# Patient Record
Sex: Male | Born: 1970 | Race: White | Hispanic: No
Health system: Midwestern US, Community
[De-identification: ages and names within clinical notes are randomized; demographics above are authoritative.]

## PROBLEM LIST (undated history)

## (undated) DIAGNOSIS — R1031 Right lower quadrant pain: Principal | ICD-10-CM

## (undated) DIAGNOSIS — F1910 Other psychoactive substance abuse, uncomplicated: Secondary | ICD-10-CM

## (undated) DIAGNOSIS — H8109 Meniere's disease, unspecified ear: Secondary | ICD-10-CM

## (undated) HISTORY — PX: FRACTURE SURGERY: SHX138

---

## 2011-06-04 ENCOUNTER — Emergency Department (HOSPITAL_COMMUNITY)
Admission: EM | Admit: 2011-06-04 | Discharge: 2011-06-04 | Disposition: A | Discharge status: Home / Self Care | Payer: Self-pay | Attending: Emergency Medicine | Admitting: Emergency Medicine

## 2011-06-04 ENCOUNTER — Emergency Department (HOSPITAL_COMMUNITY): Payer: Self-pay

## 2011-06-04 DIAGNOSIS — R1110 Vomiting, unspecified: Secondary | ICD-10-CM | POA: Insufficient documentation

## 2011-06-04 DIAGNOSIS — R109 Unspecified abdominal pain: Secondary | ICD-10-CM | POA: Insufficient documentation

## 2011-06-04 DIAGNOSIS — R509 Fever, unspecified: Secondary | ICD-10-CM | POA: Insufficient documentation

## 2011-06-04 LAB — COMPREHENSIVE METABOLIC PANEL
AST: 13 U/L (ref 0–37)
Albumin: 3.8 g/dL (ref 3.5–5.2)
Alkaline Phosphatase: 57 U/L (ref 39–117)
BUN: 18 mg/dL (ref 6–23)
Potassium: 3.9 mEq/L (ref 3.5–5.1)
Sodium: 138 mEq/L (ref 135–145)
Total Protein: 7.3 g/dL (ref 6.0–8.3)

## 2011-06-04 LAB — CBC
HCT: 40 % (ref 39.0–52.0)
MCHC: 35.3 g/dL (ref 30.0–36.0)
Platelets: 265 10*3/uL (ref 150–400)
RDW: 12.8 % (ref 11.5–15.5)
WBC: 13 10*3/uL — ABNORMAL HIGH (ref 4.0–10.5)

## 2011-06-04 LAB — DIFFERENTIAL
Basophils Absolute: 0 10*3/uL (ref 0.0–0.1)
Basophils Relative: 0 % (ref 0–1)
Eosinophils Absolute: 0.4 10*3/uL (ref 0.0–0.7)
Eosinophils Relative: 3 % (ref 0–5)
Monocytes Absolute: 0.8 10*3/uL (ref 0.1–1.0)

## 2011-06-04 LAB — LIPASE, BLOOD: Lipase: 45 U/L (ref 11–59)

## 2011-06-04 LAB — URINALYSIS, ROUTINE W REFLEX MICROSCOPIC
Bilirubin Urine: NEGATIVE
Glucose, UA: NEGATIVE mg/dL
Leukocytes, UA: NEGATIVE
Nitrite: NEGATIVE
Specific Gravity, Urine: 1.023 (ref 1.005–1.030)
pH: 5 (ref 5.0–8.0)

## 2011-06-04 MED ORDER — IOHEXOL 300 MG/ML  SOLN
100.0000 mL | Freq: Once | INTRAMUSCULAR | Status: AC | PRN
  Administered 2011-06-04: 100 mL via INTRAVENOUS

## 2011-08-01 ENCOUNTER — Emergency Department (HOSPITAL_COMMUNITY): Payer: Self-pay

## 2011-08-01 ENCOUNTER — Emergency Department (HOSPITAL_COMMUNITY)
Admission: EM | Admit: 2011-08-01 | Discharge: 2011-08-01 | Disposition: A | Discharge status: Home / Self Care | Payer: Self-pay | Attending: Emergency Medicine | Admitting: Emergency Medicine

## 2011-08-01 ENCOUNTER — Encounter: Payer: Self-pay | Admitting: Emergency Medicine

## 2011-08-01 DIAGNOSIS — R110 Nausea: Secondary | ICD-10-CM | POA: Insufficient documentation

## 2011-08-01 DIAGNOSIS — R10819 Abdominal tenderness, unspecified site: Secondary | ICD-10-CM | POA: Insufficient documentation

## 2011-08-01 DIAGNOSIS — R109 Unspecified abdominal pain: Secondary | ICD-10-CM | POA: Insufficient documentation

## 2011-08-01 LAB — CBC
HCT: 36.9 % — ABNORMAL LOW (ref 39.0–52.0)
Hemoglobin: 12.8 g/dL — ABNORMAL LOW (ref 13.0–17.0)
MCH: 30.6 pg (ref 26.0–34.0)
MCHC: 34.7 g/dL (ref 30.0–36.0)
MCV: 88.3 fL (ref 78.0–100.0)
Platelets: 225 K/uL (ref 150–400)
RBC: 4.18 MIL/uL — ABNORMAL LOW (ref 4.22–5.81)
RDW: 12.7 % (ref 11.5–15.5)
WBC: 8.3 K/uL (ref 4.0–10.5)

## 2011-08-01 LAB — COMPREHENSIVE METABOLIC PANEL
ALT: 11 U/L (ref 0–53)
Alkaline Phosphatase: 74 U/L (ref 39–117)
CO2: 26 mEq/L (ref 19–32)
GFR calc Af Amer: >90 mL/min (ref >90)
GFR calc non Af Amer: >90 mL/min (ref >90)
Glucose, Bld: 114 mg/dL — ABNORMAL HIGH (ref 70–99)
Potassium: 3.8 mEq/L (ref 3.5–5.1)
Sodium: 141 mEq/L (ref 135–145)

## 2011-08-01 LAB — LIPASE, BLOOD: Lipase: 34 U/L (ref 11–59)

## 2011-08-01 MED ORDER — HYDROMORPHONE HCL PF 1 MG/ML IJ SOLN: 1.0000 mg | Freq: Once | INTRAMUSCULAR | Status: DC

## 2011-08-01 MED ORDER — HYDROMORPHONE HCL PF 2 MG/ML IJ SOLN
INTRAMUSCULAR | Status: AC
  Filled 2011-08-01: qty 1

## 2011-08-01 MED ORDER — SODIUM CHLORIDE 0.9 % IV BOLUS (SEPSIS)
1000.0000 mL | Freq: Once | INTRAVENOUS | Status: AC
  Administered 2011-08-01: 1000 mL via INTRAVENOUS

## 2011-08-01 MED ORDER — OXYCODONE-ACETAMINOPHEN 5-325 MG PO TABS: 2.0000 | ORAL_TABLET | ORAL | Status: DC | PRN

## 2011-08-01 MED ORDER — FENTANYL CITRATE 0.05 MG/ML IJ SOLN
100.0000 ug | Freq: Once | INTRAMUSCULAR | Status: AC
  Administered 2011-08-01: 100 ug via INTRAVENOUS
  Filled 2011-08-01: qty 2

## 2011-08-01 MED ORDER — HYDROMORPHONE HCL PF 2 MG/ML IJ SOLN
INTRAMUSCULAR | Status: AC
  Administered 2011-08-01: 1 mg
  Filled 2011-08-01: qty 1

## 2011-08-01 MED ORDER — IOHEXOL 300 MG/ML  SOLN
100.0000 mL | Freq: Once | INTRAMUSCULAR | Status: AC | PRN
  Administered 2011-08-01: 100 mL via INTRAVENOUS

## 2011-08-01 MED ORDER — HYDROMORPHONE HCL PF 1 MG/ML IJ SOLN
1.0000 mg | Freq: Once | INTRAMUSCULAR | Status: AC
  Administered 2011-08-01: 1 mg via INTRAVENOUS

## 2011-08-01 MED ORDER — METRONIDAZOLE 500 MG PO TABS: 500.0000 mg | ORAL_TABLET | Freq: Two times a day (BID) | ORAL | Status: DC

## 2011-08-01 MED ORDER — CIPROFLOXACIN HCL 500 MG PO TABS: 500.0000 mg | ORAL_TABLET | Freq: Two times a day (BID) | ORAL | Status: DC

## 2011-08-01 MED ORDER — ONDANSETRON HCL 4 MG/2ML IJ SOLN
4.0000 mg | Freq: Once | INTRAMUSCULAR | Status: AC
  Administered 2011-08-01: 4 mg via INTRAVENOUS
  Filled 2011-08-01: qty 2

## 2011-08-01 MED ORDER — HYDROMORPHONE HCL PF 2 MG/ML IJ SOLN
INTRAMUSCULAR | Status: AC
  Administered 2011-08-01: 1 mg via INTRAVENOUS
  Filled 2011-08-01: qty 1
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
## 2011-08-01 NOTE — ED Notes (Signed)
No complaints at present. Voices understanding of instructions given. Walked to check out window.  

## 2011-08-01 NOTE — ED Provider Notes (Signed)
History     CSN: [id number] Arrival date & time: 08/01/2011  1:03 PM   First MD Initiated Contact with Patient 08/01/11 1315      Chief Complaint  Patient presents with  . Abdominal Pain    HPI The patient reports lower abdominal pain for 2-3 days with dark maroon stools.  He reports nausea without vomiting.  He denies diarrhea.  Denies fever chills.  He reports he is in college.  He denies alcohol abuse.  He reports no prior similar symptoms.  Nothing worsens the symptoms.  Nothing improves his symptoms.  His pain is moderate to severe at this time.  He reports the pain is constant.  He has no radiations of his abdominal  History reviewed. No pertinent past medical history.  History reviewed. No pertinent past surgical history.  History reviewed. No pertinent family history.  History  Substance Use Topics  . Smoking status: Not on file  . Smokeless tobacco: Not on file  . Alcohol Use: No      Review of Systems  Genitourinary: Negative for dysuria, frequency, flank pain, penile swelling, scrotal swelling and testicular pain.  Musculoskeletal: Negative for back pain.  All other systems reviewed and are negative.    Allergies  Penicillins  Home Medications  No current outpatient prescriptions on file.  BP 100/56  Pulse 79  Temp(Src) 98 F (36.7 C) (Oral)  Resp 16  SpO2 98%  Physical Exam  Nursing note and vitals reviewed. Constitutional: He is oriented to person, place, and time. He appears well-developed and well-nourished.  HENT:  Head: Normocephalic and atraumatic.  Eyes: EOM are normal.  Neck: Normal range of motion.  Cardiovascular: Normal rate, regular rhythm, normal heart sounds and intact distal pulses.   Pulmonary/Chest: Effort normal and breath sounds normal. No respiratory distress.  Abdominal: Soft. He exhibits no distension. There is tenderness.       Tenderness in the epigastric and right upper quadrant without guarding or rebound    Genitourinary:       Normal tone.  No external hemorrhoids.  Normal colored stool obtained.  No gross blood or clots.  Musculoskeletal: Normal range of motion.  Neurological: He is alert and oriented to person, place, and time.  Skin: Skin is warm and dry.  Psychiatric: He has a normal mood and affect. Judgment normal.    ED Course  Procedures (including critical care time)  Labs Reviewed  CBC - Abnormal; Notable for the following:    RBC 4.18 (*)    Hemoglobin 12.8 (*)    HCT 36.9 (*)    All other components within normal limits  COMPREHENSIVE METABOLIC PANEL - Abnormal; Notable for the following:    Glucose, Bld 114 (*)    Albumin 3.4 (*)    Total Bilirubin 0.1 (*)    All other components within normal limits  LIPASE, BLOOD  OCCULT BLOOD, POC DEVICE  OCCULT BLOOD X 1 CARD TO LAB, STOOL   No results found.   No diagnosis found.    MDM  Will treat pain and obtain labs at this time.  Hemoccult sent  4:47 PM Patient is feeling better at this time.  Given his abdominal pain a CT scan will be obtained.  His stool is normal color on rectal exam.  My suspicion for significant GI bleed is very low.  His stools are not with melena there are normal in color. Care to Dr Freida Busman at 1645 to follow up on CT scan  Lyanne Co, MD 08/01/11 [phone]

## 2011-08-01 NOTE — ED Notes (Signed)
[id number]<AV> Expected date:08/01/11<BR> Expected time:12:56 PM<BR> Means of arrival:Ambulance<BR> Comments:<BR> abd pain

## 2011-08-01 NOTE — ED Provider Notes (Signed)
pts abd ct with possible diverticulitis, patient given pain medication here. We'll treat for possible diverticulitis he'll followup with his Dr. as needed  Toy Baker, MD 08/01/11 [phone]

## 2011-08-05 ENCOUNTER — Emergency Department (HOSPITAL_COMMUNITY): Payer: Self-pay

## 2011-08-05 ENCOUNTER — Encounter (HOSPITAL_COMMUNITY): Payer: Self-pay | Admitting: Emergency Medicine

## 2011-08-05 ENCOUNTER — Emergency Department (HOSPITAL_COMMUNITY)
Admission: EM | Admit: 2011-08-05 | Discharge: 2011-08-05 | Disposition: A | Discharge status: Home / Self Care | Payer: No Typology Code available for payment source | Attending: Emergency Medicine | Admitting: Emergency Medicine

## 2011-08-05 DIAGNOSIS — G8929 Other chronic pain: Secondary | ICD-10-CM | POA: Insufficient documentation

## 2011-08-05 DIAGNOSIS — R109 Unspecified abdominal pain: Secondary | ICD-10-CM | POA: Insufficient documentation

## 2011-08-05 DIAGNOSIS — S060X9A Concussion with loss of consciousness of unspecified duration, initial encounter: Secondary | ICD-10-CM | POA: Insufficient documentation

## 2011-08-05 DIAGNOSIS — S060XAA Concussion with loss of consciousness status unknown, initial encounter: Secondary | ICD-10-CM | POA: Insufficient documentation

## 2011-08-05 DIAGNOSIS — M542 Cervicalgia: Secondary | ICD-10-CM | POA: Insufficient documentation

## 2011-08-05 DIAGNOSIS — M549 Dorsalgia, unspecified: Secondary | ICD-10-CM | POA: Insufficient documentation

## 2011-08-05 DIAGNOSIS — S161XXA Strain of muscle, fascia and tendon at neck level, initial encounter: Secondary | ICD-10-CM

## 2011-08-05 DIAGNOSIS — R51 Headache: Secondary | ICD-10-CM | POA: Insufficient documentation

## 2011-08-05 DIAGNOSIS — S139XXA Sprain of joints and ligaments of unspecified parts of neck, initial encounter: Secondary | ICD-10-CM | POA: Insufficient documentation

## 2011-08-05 LAB — CBC
Hemoglobin: 13.6 g/dL (ref 13.0–17.0)
MCH: 31.1 pg (ref 26.0–34.0)
MCHC: 35.2 g/dL (ref 30.0–36.0)
Platelets: 240 10*3/uL (ref 150–400)

## 2011-08-05 LAB — LIPASE, BLOOD: Lipase: 25 U/L (ref 11–59)

## 2011-08-05 LAB — COMPREHENSIVE METABOLIC PANEL
ALT: 15 U/L (ref 0–53)
AST: 16 U/L (ref 0–37)
Albumin: 3.7 g/dL (ref 3.5–5.2)
Alkaline Phosphatase: 62 U/L (ref 39–117)
Chloride: 104 mEq/L (ref 96–112)
Potassium: 3.4 mEq/L — ABNORMAL LOW (ref 3.5–5.1)
Sodium: 139 mEq/L (ref 135–145)
Total Bilirubin: 0.4 mg/dL (ref 0.3–1.2)
Total Protein: 7 g/dL (ref 6.0–8.3)

## 2011-08-05 LAB — DIFFERENTIAL
Basophils Absolute: 0 10*3/uL (ref 0.0–0.1)
Basophils Relative: 0 % (ref 0–1)
Eosinophils Absolute: 0.3 10*3/uL (ref 0.0–0.7)
Monocytes Relative: 8 % (ref 3–12)
Neutro Abs: 7.9 10*3/uL — ABNORMAL HIGH (ref 1.7–7.7)
Neutrophils Relative %: 70 % (ref 43–77)

## 2011-08-05 MED ORDER — HYDROMORPHONE HCL PF 1 MG/ML IJ SOLN
0.5000 mg | Freq: Once | INTRAMUSCULAR | Status: AC
  Administered 2011-08-05: 0.5 mg via INTRAMUSCULAR
  Filled 2011-08-05: qty 1

## 2011-08-05 MED ORDER — HYDROCODONE-ACETAMINOPHEN 5-325 MG PO TABS: 2.0000 | ORAL_TABLET | ORAL | Status: DC | PRN

## 2011-08-05 MED ORDER — HYDROMORPHONE HCL PF 2 MG/ML IJ SOLN
2.0000 mg | Freq: Once | INTRAMUSCULAR | Status: AC
  Administered 2011-08-05: 2 mg via INTRAMUSCULAR
  Filled 2011-08-05: qty 1

## 2011-08-05 NOTE — ED Notes (Signed)
Patient transported to X-ray 

## 2011-08-05 NOTE — ED Notes (Signed)
EDP at bedside to assess pt 

## 2011-08-05 NOTE — ED Notes (Signed)
Patient transported to CT 

## 2011-08-05 NOTE — ED Notes (Signed)
PT back from radiology.  Sleeping but easily arousible.  States pain 7/10.

## 2011-08-05 NOTE — ED Notes (Signed)
Pt continues to sleep, but is easily arousible.

## 2011-08-05 NOTE — ED Notes (Signed)
Pt c/o HA, back pain, and abdominal pain.  Hx of diverticulitis, no new abdominal pain.  Also states his legs hurt, will not move legs due to pain.

## 2011-08-05 NOTE — ED Notes (Signed)
Spoke with pt.  C/o lower back pain.
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
## 2011-08-06 ENCOUNTER — Emergency Department (HOSPITAL_COMMUNITY)
Admission: EM | Admit: 2011-08-06 | Discharge: 2011-08-06 | Disposition: A | Discharge status: Home / Self Care | Payer: No Typology Code available for payment source | Attending: Emergency Medicine | Admitting: Emergency Medicine

## 2011-08-06 ENCOUNTER — Encounter (HOSPITAL_COMMUNITY): Payer: Self-pay | Admitting: Emergency Medicine

## 2011-08-06 DIAGNOSIS — R209 Unspecified disturbances of skin sensation: Secondary | ICD-10-CM | POA: Insufficient documentation

## 2011-08-06 DIAGNOSIS — M542 Cervicalgia: Secondary | ICD-10-CM | POA: Insufficient documentation

## 2011-08-06 DIAGNOSIS — S39012A Strain of muscle, fascia and tendon of lower back, initial encounter: Secondary | ICD-10-CM

## 2011-08-06 DIAGNOSIS — M549 Dorsalgia, unspecified: Secondary | ICD-10-CM | POA: Insufficient documentation

## 2011-08-06 DIAGNOSIS — G8929 Other chronic pain: Secondary | ICD-10-CM | POA: Insufficient documentation

## 2011-08-06 MED ORDER — OXYCODONE-ACETAMINOPHEN 5-325 MG PO TABS: 1.0000 | ORAL_TABLET | Freq: Four times a day (QID) | ORAL | Status: DC | PRN

## 2011-08-06 MED ORDER — SODIUM CHLORIDE 0.9 % IV SOLN: INTRAVENOUS | Status: DC

## 2011-08-06 MED ORDER — CYCLOBENZAPRINE HCL 10 MG PO TABS: 10.0000 mg | ORAL_TABLET | Freq: Three times a day (TID) | ORAL | Status: AC | PRN

## 2011-08-06 MED ORDER — SODIUM CHLORIDE 0.9 % IV BOLUS (SEPSIS): 500.0000 mL | Freq: Once | INTRAVENOUS | Status: DC

## 2011-08-06 MED ORDER — OXYCODONE-ACETAMINOPHEN 5-325 MG PO TABS
2.0000 | ORAL_TABLET | Freq: Once | ORAL | Status: AC
  Administered 2011-08-06: 2 via ORAL
  Filled 2011-08-06: qty 2

## 2011-08-06 MED ORDER — ONDANSETRON HCL 4 MG/2ML IJ SOLN
4.0000 mg | Freq: Once | INTRAMUSCULAR | Status: DC
  Filled 2011-08-06: qty 2

## 2011-08-06 MED ORDER — NAPROXEN 500 MG PO TABS: 500.0000 mg | ORAL_TABLET | Freq: Two times a day (BID) | ORAL | Status: DC

## 2011-08-06 MED ORDER — HYDROMORPHONE HCL PF 1 MG/ML IJ SOLN
1.0000 mg | Freq: Once | INTRAMUSCULAR | Status: DC
  Filled 2011-08-06: qty 1

## 2011-08-06 NOTE — ED Notes (Signed)
EDP Zackowski in to see this pt.  All needs and plan of care discussed with this pt.  Charge Therapist, sports in to see this pt and plan of care discussed.   Security present to assist- Pt calmer and requesting sandhich and juice- given as per request
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
## 2011-08-06 NOTE — ED Notes (Signed)
EDP at bedside updating pt on plan of care, told pt we are going to try oral pain medication, pt continues to be agitated.  Security present at bedside.

## 2011-08-06 NOTE — ED Notes (Signed)
Unable to obtain iv access pt difficult to arouse [facility] notified and aware

## 2011-08-06 NOTE — ED Notes (Signed)
Pt at present aroused and agitated. Made aware of plan of care and pain management schedule- This pt was made aware of pain medication ordered and IV.  Pt declined pain management scheduled per this Clinical research associate.  Pt request this Clinical research associate to speak with EDP.  EDP Zackowski made aware of pt current status and plan- At present EDP request NO MEDICATION AND NO IV TO BE GIVEN AT THIS TIME.   Pt made aware of change in plan of care.  Pt remains alert and agitated requesting to speak with doctor.  VSS. Ladona Ridgel RN verified conversation.  Pt loud being verbally abusive to staff- This Clinical research associate requested security
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
## 2011-08-12 ENCOUNTER — Emergency Department (HOSPITAL_COMMUNITY)
Admission: EM | Admit: 2011-08-12 | Discharge: 2011-08-12 | Disposition: A | Discharge status: Home / Self Care | Payer: Self-pay | Attending: Emergency Medicine | Admitting: Emergency Medicine

## 2011-08-12 ENCOUNTER — Encounter (HOSPITAL_COMMUNITY): Payer: Self-pay | Admitting: *Deleted

## 2011-08-12 DIAGNOSIS — R197 Diarrhea, unspecified: Secondary | ICD-10-CM | POA: Insufficient documentation

## 2011-08-12 DIAGNOSIS — K859 Acute pancreatitis without necrosis or infection, unspecified: Secondary | ICD-10-CM | POA: Insufficient documentation

## 2011-08-12 DIAGNOSIS — R112 Nausea with vomiting, unspecified: Secondary | ICD-10-CM | POA: Insufficient documentation

## 2011-08-12 DIAGNOSIS — R10819 Abdominal tenderness, unspecified site: Secondary | ICD-10-CM | POA: Insufficient documentation

## 2011-08-12 DIAGNOSIS — R109 Unspecified abdominal pain: Secondary | ICD-10-CM | POA: Insufficient documentation

## 2011-08-12 HISTORY — DX: Meniere's disease, unspecified ear: H81.09

## 2011-08-12 LAB — CBC
HCT: 37.8 % — ABNORMAL LOW (ref 39.0–52.0)
Hemoglobin: 12.6 g/dL — ABNORMAL LOW (ref 13.0–17.0)
MCH: 30.4 pg (ref 26.0–34.0)
MCV: 91.1 fL (ref 78.0–100.0)
Platelets: 290 10*3/uL (ref 150–400)
RBC: 4.15 MIL/uL — ABNORMAL LOW (ref 4.22–5.81)
WBC: 7.5 10*3/uL (ref 4.0–10.5)

## 2011-08-12 LAB — DIFFERENTIAL
Eosinophils Relative: 4 % (ref 0–5)
Lymphocytes Relative: 19 % (ref 12–46)
Lymphs Abs: 1.4 10*3/uL (ref 0.7–4.0)
Monocytes Relative: 8 % (ref 3–12)

## 2011-08-12 LAB — LIPASE, BLOOD: Lipase: 154 U/L — ABNORMAL HIGH (ref 11–59)

## 2011-08-12 LAB — POCT I-STAT, CHEM 8
BUN: 15 mg/dL (ref 6–23)
Calcium, Ion: 1.13 mmol/L (ref 1.12–1.32)
Chloride: 107 mEq/L (ref 96–112)
Creatinine, Ser: 1.2 mg/dL (ref 0.50–1.35)
HCT: 38 % — ABNORMAL LOW (ref 39.0–52.0)
Hemoglobin: 12.9 g/dL — ABNORMAL LOW (ref 13.0–17.0)
Sodium: 144 mEq/L (ref 135–145)
TCO2: 27 mmol/L (ref 0–100)

## 2011-08-12 MED ORDER — DICYCLOMINE HCL 20 MG PO TABS: 20.0000 mg | ORAL_TABLET | Freq: Two times a day (BID) | ORAL | Status: DC

## 2011-08-12 MED ORDER — MORPHINE SULFATE 4 MG/ML IJ SOLN: 4.0000 mg | Freq: Once | INTRAMUSCULAR | Status: DC

## 2011-08-12 MED ORDER — MORPHINE SULFATE 2 MG/ML IJ SOLN
INTRAMUSCULAR | Status: AC
  Administered 2011-08-12: 4 mg via INTRAVENOUS
  Filled 2011-08-12: qty 2

## 2011-08-12 MED ORDER — OXYCODONE-ACETAMINOPHEN 5-325 MG PO TABS: 2.0000 | ORAL_TABLET | ORAL | Status: AC | PRN

## 2011-08-12 MED ORDER — ONDANSETRON 4 MG PO TBDP: 4.0000 mg | ORAL_TABLET | Freq: Four times a day (QID) | ORAL | Status: AC | PRN

## 2011-08-12 MED ORDER — ONDANSETRON HCL 4 MG/2ML IJ SOLN
4.0000 mg | Freq: Once | INTRAMUSCULAR | Status: AC
  Administered 2011-08-12: 4 mg via INTRAVENOUS
  Filled 2011-08-12: qty 2

## 2011-08-12 MED ORDER — SODIUM CHLORIDE 0.9 % IV BOLUS (SEPSIS)
1000.0000 mL | Freq: Once | INTRAVENOUS | Status: AC
  Administered 2011-08-12: 1000 mL via INTRAVENOUS

## 2011-08-12 MED ORDER — HYDROMORPHONE HCL PF 1 MG/ML IJ SOLN
1.0000 mg | Freq: Once | INTRAMUSCULAR | Status: AC
  Administered 2011-08-12: 1 mg via INTRAVENOUS
  Filled 2011-08-12: qty 1

## 2011-08-12 MED ORDER — MORPHINE SULFATE 4 MG/ML IJ SOLN: 4.0000 mg | INTRAMUSCULAR | Status: DC | PRN

## 2011-08-12 MED ORDER — OXYCODONE-ACETAMINOPHEN 5-325 MG PO TABS: 1.0000 | ORAL_TABLET | ORAL | Status: DC | PRN

## 2011-08-12 MED ORDER — DICYCLOMINE HCL 10 MG PO CAPS
10.0000 mg | ORAL_CAPSULE | Freq: Once | ORAL | Status: AC
  Administered 2011-08-12: 10 mg via ORAL
  Filled 2011-08-12: qty 1

## 2011-08-12 NOTE — ED Notes (Signed)
Pt given discharge instructions and verbalizes understanding  
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
## 2011-08-12 NOTE — ED Provider Notes (Signed)
Medical screening examination/treatment/procedure(s) were performed by non-physician practitioner and as supervising physician I was immediately available for consultation/collaboration.  Martha K Linker, MD 08/12/11 1221 

## 2011-08-12 NOTE — ED Notes (Signed)
Pt states that he has been having n/v/d with abdominal pain since Wed. Pt states that he has ulcerative colitis and states that this could have something to do with that.

## 2011-08-14 ENCOUNTER — Emergency Department (HOSPITAL_COMMUNITY)
Admission: EM | Admit: 2011-08-14 | Discharge: 2011-08-15 | Disposition: A | Discharge status: Other Acute Inpatient Hospital | Payer: Self-pay | Attending: Emergency Medicine | Admitting: Emergency Medicine

## 2011-08-14 ENCOUNTER — Encounter (HOSPITAL_COMMUNITY): Payer: Self-pay | Admitting: *Deleted

## 2011-08-14 DIAGNOSIS — F319 Bipolar disorder, unspecified: Secondary | ICD-10-CM | POA: Insufficient documentation

## 2011-08-14 DIAGNOSIS — K5190 Ulcerative colitis, unspecified, without complications: Secondary | ICD-10-CM | POA: Insufficient documentation

## 2011-08-14 DIAGNOSIS — R45851 Suicidal ideations: Secondary | ICD-10-CM | POA: Insufficient documentation

## 2011-08-14 DIAGNOSIS — H8109 Meniere's disease, unspecified ear: Secondary | ICD-10-CM | POA: Insufficient documentation

## 2011-08-14 DIAGNOSIS — F329 Major depressive disorder, single episode, unspecified: Secondary | ICD-10-CM

## 2011-08-14 LAB — COMPREHENSIVE METABOLIC PANEL
ALT: 18 U/L (ref 0–53)
AST: 17 U/L (ref 0–37)
CO2: 25 mEq/L (ref 19–32)
Chloride: 100 mEq/L (ref 96–112)
Creatinine, Ser: 1.09 mg/dL (ref 0.50–1.35)
GFR calc Af Amer: >90 mL/min (ref >90)
GFR calc non Af Amer: 82 mL/min — ABNORMAL LOW (ref >90)
Glucose, Bld: 83 mg/dL (ref 70–99)
Sodium: 137 mEq/L (ref 135–145)
Total Bilirubin: 0.6 mg/dL (ref 0.3–1.2)

## 2011-08-14 LAB — CBC
Hemoglobin: 13.9 g/dL (ref 13.0–17.0)
MCV: 90.2 fL (ref 78.0–100.0)
Platelets: 310 10*3/uL (ref 150–400)
RBC: 4.57 MIL/uL (ref 4.22–5.81)
WBC: 10.9 10*3/uL — ABNORMAL HIGH (ref 4.0–10.5)

## 2011-08-14 LAB — ETHANOL: Alcohol, Ethyl (B): <11 mg/dL (ref 0–11)

## 2011-08-14 LAB — RAPID URINE DRUG SCREEN, HOSP PERFORMED
Amphetamines: NOT DETECTED
Benzodiazepines: POSITIVE — AB
Cocaine: POSITIVE — AB
Opiates: NOT DETECTED

## 2011-08-14 MED ORDER — ONDANSETRON 4 MG PO TBDP: 4.0000 mg | ORAL_TABLET | Freq: Four times a day (QID) | ORAL | Status: DC | PRN

## 2011-08-14 MED ORDER — ZOLPIDEM TARTRATE 5 MG PO TABS: 5.0000 mg | ORAL_TABLET | Freq: Every evening | ORAL | Status: DC | PRN

## 2011-08-14 MED ORDER — CYCLOBENZAPRINE HCL 10 MG PO TABS
10.0000 mg | ORAL_TABLET | Freq: Three times a day (TID) | ORAL | Status: DC | PRN
  Administered 2011-08-14: 10 mg via ORAL
  Filled 2011-08-14: qty 1

## 2011-08-14 MED ORDER — ALUM & MAG HYDROXIDE-SIMETH 200-200-20 MG/5ML PO SUSP: 30.0000 mL | ORAL | Status: DC | PRN

## 2011-08-14 MED ORDER — ACETAMINOPHEN 325 MG PO TABS: 650.0000 mg | ORAL_TABLET | ORAL | Status: DC | PRN

## 2011-08-14 MED ORDER — LORAZEPAM 1 MG PO TABS: 1.0000 mg | ORAL_TABLET | Freq: Three times a day (TID) | ORAL | Status: DC | PRN

## 2011-08-14 MED ORDER — NAPROXEN 500 MG PO TABS
500.0000 mg | ORAL_TABLET | Freq: Two times a day (BID) | ORAL | Status: DC
  Administered 2011-08-15: 500 mg via ORAL
  Filled 2011-08-14: qty 1

## 2011-08-14 MED ORDER — ONDANSETRON HCL 4 MG PO TABS: 4.0000 mg | ORAL_TABLET | Freq: Three times a day (TID) | ORAL | Status: DC | PRN

## 2011-08-14 MED ORDER — DICYCLOMINE HCL 20 MG PO TABS
20.0000 mg | ORAL_TABLET | Freq: Two times a day (BID) | ORAL | Status: DC
  Administered 2011-08-14 – 2011-08-15 (×2): 20 mg via ORAL
  Filled 2011-08-14 (×2): qty 1

## 2011-08-14 MED ORDER — IBUPROFEN 600 MG PO TABS: 600.0000 mg | ORAL_TABLET | Freq: Three times a day (TID) | ORAL | Status: DC | PRN

## 2011-08-14 MED ORDER — NICOTINE 21 MG/24HR TD PT24
21.0000 mg | MEDICATED_PATCH | Freq: Every day | TRANSDERMAL | Status: DC
  Filled 2011-08-14 (×2): qty 1

## 2011-08-14 NOTE — BH Assessment (Signed)
Assessment Note   Ian Ruiz is an 40 y.o. male who presented to [facility] Emergency Department with the chief complaint of increased depression over the past 6 months and active suicidal thoughts. Per MD note, pt stated he has several stressors such as not being able to see his children, being unemployed for 6 months, no current relationships, and living by himself along with drug use. During assessment pt was observed to exhibit depressive symptoms such as irritability and being socially withdrawn. Pt verbalized to this assessor that he did not want to answer questions presented to him, stating "Ya'll have this information in the system. I'm tired of repeating the same stuff. Don't ask me anything else. You're just being nosy." Per MD note, pt endorses hallucinations over the last 3 days with voices telling him that he is not worth anything along with constant cursing. Pt endorses SI/AVH at this time and requires inpatient treatment.     Axis I: Bipolar, Depressed Axis II: Deferred Axis III:  Past Medical History  Diagnosis Date  . Ulcerative colitis   . Meniere disease   . Bipolar 1 disorder, depressed    Axis IV: occupational problems and problems related to social environment Axis V: 51-60 moderate symptoms  Past Medical History:  Past Medical History  Diagnosis Date  . Ulcerative colitis   . Meniere disease   . Bipolar 1 disorder, depressed     History reviewed. No pertinent past surgical history.  Family History: History reviewed. No pertinent family history.  Social History:  reports that he has quit smoking. He has never used smokeless tobacco. He reports that he uses illicit drugs (Marijuana). He reports that he does not drink alcohol.  Additional Social History:  Alcohol / Drug Use History of alcohol / drug use?:  (Pt refused to provide SA information) Longest period of sobriety (when/how long): Unknown Allergies:  Allergies  Allergen Reactions  . Penicillins  Rash    Home Medications:  Medications Prior to Admission  Medication Dose Route Frequency Provider Last Rate Last Dose  . acetaminophen (TYLENOL) tablet 650 mg  650 mg Oral Q4H PRN Vida Roller, MD      . alum & mag hydroxide-simeth (MAALOX/MYLANTA) 200-200-20 MG/5ML suspension 30 mL  30 mL Oral PRN Vida Roller, MD      . ibuprofen (ADVIL,MOTRIN) tablet 600 mg  600 mg Oral Q8H PRN Vida Roller, MD      . LORazepam (ATIVAN) tablet 1 mg  1 mg Oral Q8H PRN Vida Roller, MD      . nicotine (NICODERM CQ - dosed in mg/24 hours) patch 21 mg  21 mg Transdermal Daily Vida Roller, MD      . ondansetron [facility]) tablet 4 mg  4 mg Oral Q8H PRN Vida Roller, MD      . zolpidem (AMBIEN) tablet 5 mg  5 mg Oral QHS PRN Vida Roller, MD       Medications Prior to Admission  Medication Sig Dispense Refill  . cyclobenzaprine (FLEXERIL) 10 MG tablet Take 1 tablet (10 mg total) by mouth 3 (three) times daily as needed for muscle spasms.  20 tablet  0  . dicyclomine (BENTYL) 20 MG tablet Take 1 tablet (20 mg total) by mouth 2 (two) times daily.  20 tablet  0  . naproxen (NAPROSYN) 500 MG tablet Take 1 tablet (500 mg total) by mouth 2 (two) times daily.  20 tablet  0  . ondansetron (ZOFRAN  ODT) 4 MG disintegrating tablet Take 1 tablet (4 mg total) by mouth every 6 (six) hours as needed for nausea.  6 tablet  0  . oxyCODONE-acetaminophen (PERCOCET) 5-325 MG per tablet Take 2 tablets by mouth every 4 (four) hours as needed for pain.  15 tablet  0    OB/GYN Status:  No LMP for male patient.  General Assessment Data Location of Assessment: [facility] ED ACT Assessment: Yes Living Arrangements: Alone Can pt return to current living arrangement?: Yes Admission Status: Involuntary Is patient capable of signing voluntary admission?: No Transfer from: Acute Hospital  Education Status Is patient currently in school?: No  Risk to self Suicidal Ideation: Yes-Currently Present Suicidal Intent:  (Pt  refused to answer) Is patient at risk for suicide?: Yes Suicidal Plan?:  (None specified) Access to Means: Yes Specify Access to Suicidal Means: Access to objects and streets What has been your use of drugs/alcohol within the last 12 months?: Pt refused to provide SA information to assessor Previous Attempts/Gestures: No (None noted or reported by pt) Triggers for Past Attempts: None known Intentional Self Injurious Behavior: None Family Suicide History: Unable to assess Recent stressful life event(s): Job Loss;Financial Problems Persecutory voices/beliefs?: No Depression: Yes Depression Symptoms: Feeling angry/irritable;Loss of interest in usual pleasures Substance abuse history and/or treatment for substance abuse?:  (Unknown. pt refused to provide SA information to assessor)  Risk to Others Homicidal Ideation: No-Not Currently/Within Last 6 Months Thoughts of Harm to Others: No-Not Currently Present/Within Last 6 Months Current Homicidal Intent: No-Not Currently/Within Last 6 Months Current Homicidal Plan: No-Not Currently/Within Last 6 Months Access to Homicidal Means: No History of harm to others?: No (Pt refused to provide information) Assessment of Violence: None Noted Does patient have access to weapons?: No Criminal Charges Pending?: No Does patient have a court date: No  Psychosis Hallucinations: Auditory (Pt endorses hearing voices ) Delusions: None noted  Mental Status Report Appear/Hygiene: Other (Comment) (Appropriate) Eye Contact: Fair Motor Activity: Freedom of movement Speech: Logical/coherent Level of Consciousness: Alert;Irritable Mood: Depressed;Irritable Affect: Depressed;Irritable Anxiety Level: None Thought Processes: Coherent;Relevant Judgement: Impaired Orientation: Person;Place;Time;Situation Obsessive Compulsive Thoughts/Behaviors: None  Cognitive Functioning Concentration: Decreased Memory: Recent Intact IQ: Average Insight: Poor Impulse  Control: Poor Appetite: Fair Sleep:  (Unknown) Vegetative Symptoms: None  Prior Inpatient Therapy Prior Inpatient Therapy:  (Unknown. Pt refused to provide information)  Prior Outpatient Therapy Prior Outpatient Therapy:  (Unknown. Pt refused to provide information)  ADL Screening (condition at time of admission) Patient's cognitive ability adequate to safely complete daily activities?: Yes Patient able to express need for assistance with ADLs?: Yes Independently performs ADLs?: Yes Weakness of Legs: None Weakness of Arms/Hands: None     Therapy Consults (therapy consults require a physician order) PT Evaluation Needed: No OT Evalulation Needed: No Abuse/Neglect Assessment (Assessment to be complete while patient is alone) Physical Abuse:  (Pt refused to answer, no noted hx of abuse present ) Verbal Abuse:  (pt refused to answer, no noted hx of abuse present) Sexual Abuse:  (pt refused to answer, no noted hx of abuse present) Values / Beliefs Cultural Requests During Hospitalization: None Spiritual Requests During Hospitalization: None Consults Spiritual Care Consult Needed: No Social Work Consult Needed: No      Additional Information 1:1 In Past 12 Months?: No CIRT Risk: No Elopement Risk: No Does patient have medical clearance?: Yes     Disposition: Referral made to [facility] for inpatient treatment. Disposition Disposition of Patient: Inpatient treatment program Type  of inpatient treatment program: Adult  On Site Evaluation by: Self   Reviewed with Physician:     Haskel Khan 08/14/2011 3:36 PM

## 2011-08-14 NOTE — ED Provider Notes (Signed)
History     CSN: [id number] Arrival date & time: 08/14/2011  2:58 AM   First MD Initiated Contact with Patient 08/14/11 0359      Chief Complaint  Patient presents with  . Medical Clearance  . Suicidal    (Consider location/radiation/quality/duration/timing/severity/associated sxs/prior treatment) HPI Comments: Patient presents with active suicidal thoughts, increased depression over the last 6 months. Patient cites the fact that he has not been able to see his children because his family won't let him, unemployed for 6 months, no relationships, lives by himself and uses drugs. Symptoms are severe, gradually getting worse. He endorses hallucinations over the last 3 days with voices telling them that is not worth anything and constant cursing  The history is provided by the patient.    Past Medical History  Diagnosis Date  . Ulcerative colitis   . Meniere disease   . Bipolar 1 disorder, depressed     History reviewed. No pertinent past surgical history.  History reviewed. No pertinent family history.  History  Substance Use Topics  . Smoking status: Former Games developer  . Smokeless tobacco: Never Used  . Alcohol Use: No     x 2 days ago, last use      Review of Systems  All other systems reviewed and are negative.    Allergies  Penicillins  Home Medications   Current Outpatient Rx  Name Route Sig Dispense Refill  . CYCLOBENZAPRINE HCL 10 MG PO TABS Oral Take 1 tablet (10 mg total) by mouth 3 (three) times daily as needed for muscle spasms. 20 tablet 0  . DICYCLOMINE HCL 20 MG PO TABS Oral Take 1 tablet (20 mg total) by mouth 2 (two) times daily. 20 tablet 0  . NAPROXEN 500 MG PO TABS Oral Take 1 tablet (500 mg total) by mouth 2 (two) times daily. 20 tablet 0  . ONDANSETRON 4 MG PO TBDP Oral Take 1 tablet (4 mg total) by mouth every 6 (six) hours as needed for nausea. 6 tablet 0  . OXYCODONE-ACETAMINOPHEN 5-325 MG PO TABS Oral Take 2 tablets by mouth every 4 (four)  hours as needed for pain. 15 tablet 0    BP 131/83  Pulse 93  Temp(Src) 98.3 F (36.8 C) (Oral)  Resp 17  Ht 5\' 10"  (1.778 m)  Wt 175 lb (79.379 kg)  BMI 25.11 kg/m2  SpO2 98%  Physical Exam  Nursing note and vitals reviewed. Constitutional: He appears well-developed and well-nourished. No distress.  HENT:  Head: Normocephalic and atraumatic.  Mouth/Throat: Oropharynx is clear and moist. No oropharyngeal exudate.  Eyes: Conjunctivae and EOM are normal. Pupils are equal, round, and reactive to light. Right eye exhibits no discharge. Left eye exhibits no discharge. No scleral icterus.  Neck: Normal range of motion. Neck supple. No JVD present. No thyromegaly present.  Cardiovascular: Normal rate, regular rhythm, normal heart sounds and intact distal pulses.  Exam reveals no gallop and no friction rub.   No murmur heard. Pulmonary/Chest: Effort normal and breath sounds normal. No respiratory distress. He has no wheezes. He has no rales.  Abdominal: Soft. Bowel sounds are normal. He exhibits no distension and no mass. There is no tenderness.  Musculoskeletal: Normal range of motion. He exhibits no edema and no tenderness.  Lymphadenopathy:    He has no cervical adenopathy.  Neurological: He is alert. Coordination normal.  Skin: Skin is warm and dry. No rash noted. No erythema.  Psychiatric:       Poor eye contact,  mildly disheveled, depressed affect, suicidal thoughts, endorses wanting to walk into traffic or jumping off a bridge or cutting his wrists or taking an overdose    ED Course  Procedures (including critical care time)  Labs Reviewed  CBC - Abnormal; Notable for the following:    WBC 10.9 (*)    All other components within normal limits  COMPREHENSIVE METABOLIC PANEL - Abnormal; Notable for the following:    GFR calc non Af Amer 82 (*)    All other components within normal limits  ETHANOL  URINE RAPID DRUG SCREEN (HOSP PERFORMED)   No results found.   1.  Suicidal thoughts   2. Depression       MDM  Patient states he is only here because a friend brought him, he does endorse active suicidal thoughts and severe depression. Will require involuntary commitment and placement  Patient reevaluated, still suicidal, will place in behavioral health unit pending psychiatric consult and transfer. Involuntary commitment papers have been filled out has had holding orders  Change of shift - care signed to Dr. Orpah Cobb, MD 08/14/11 [phone]

## 2011-08-14 NOTE — ED Notes (Signed)
Pt states SI w/ plan to jump off a bridge. Pt states prior suicide attempt by cutting wrists. Pt denies HI. Pt denies ETOH and drug use. Pt states auditory hallucinations w/o prior history.

## 2011-08-14 NOTE — ED Notes (Signed)
Patient transferred to psych ED with SI. He refuses to answer further questions. States he has already answered questions before coming back here.

## 2011-08-14 NOTE — ED Notes (Signed)
[facility] notified CSW that test results from pt's drug panel screen are needed to review referral. Pt's RN notified.

## 2011-08-15 ENCOUNTER — Encounter (HOSPITAL_COMMUNITY): Payer: Self-pay | Admitting: *Deleted

## 2011-08-15 ENCOUNTER — Inpatient Hospital Stay (HOSPITAL_COMMUNITY)
Admission: AD | Admit: 2011-08-15 | Discharge: 2011-08-19 | DRG: 885 | Disposition: A | Discharge status: Home / Self Care | Payer: PRIVATE HEALTH INSURANCE | Source: Ambulatory Visit | Attending: Psychiatry | Admitting: Psychiatry

## 2011-08-15 DIAGNOSIS — F3130 Bipolar disorder, current episode depressed, mild or moderate severity, unspecified: Principal | ICD-10-CM

## 2011-08-15 DIAGNOSIS — R45851 Suicidal ideations: Secondary | ICD-10-CM

## 2011-08-15 DIAGNOSIS — Z79899 Other long term (current) drug therapy: Secondary | ICD-10-CM

## 2011-08-15 DIAGNOSIS — H8109 Meniere's disease, unspecified ear: Secondary | ICD-10-CM

## 2011-08-15 DIAGNOSIS — Z880 Allergy status to penicillin: Secondary | ICD-10-CM

## 2011-08-15 DIAGNOSIS — F341 Dysthymic disorder: Secondary | ICD-10-CM | POA: Diagnosis present

## 2011-08-15 MED ORDER — ACETAMINOPHEN 325 MG PO TABS
650.0000 mg | ORAL_TABLET | Freq: Four times a day (QID) | ORAL | Status: DC | PRN
  Administered 2011-08-17: 650 mg via ORAL

## 2011-08-15 MED ORDER — ALUM & MAG HYDROXIDE-SIMETH 200-200-20 MG/5ML PO SUSP: 30.0000 mL | ORAL | Status: AC | PRN

## 2011-08-15 MED ORDER — HALOPERIDOL 2 MG PO TABS: 2.0000 mg | ORAL_TABLET | Freq: Every day | ORAL | Status: DC

## 2011-08-15 MED ORDER — CITALOPRAM HYDROBROMIDE 20 MG PO TABS
20.0000 mg | ORAL_TABLET | Freq: Every day | ORAL | Status: DC
  Administered 2011-08-15: 20 mg via ORAL
  Filled 2011-08-15: qty 1

## 2011-08-15 MED ORDER — CITALOPRAM HYDROBROMIDE 20 MG PO TABS
20.0000 mg | ORAL_TABLET | Freq: Every day | ORAL | Status: DC
  Administered 2011-08-16: 20 mg via ORAL
  Filled 2011-08-15 (×2): qty 1

## 2011-08-15 MED ORDER — DIPHENHYDRAMINE HCL 25 MG PO CAPS: 50.0000 mg | ORAL_CAPSULE | Freq: Every day | ORAL | Status: DC

## 2011-08-15 MED ORDER — ALUM & MAG HYDROXIDE-SIMETH 200-200-20 MG/5ML PO SUSP: 30.0000 mL | ORAL | Status: DC | PRN

## 2011-08-15 MED ORDER — ACETAMINOPHEN 325 MG PO TABS: 650.0000 mg | ORAL_TABLET | Freq: Four times a day (QID) | ORAL | Status: AC | PRN

## 2011-08-15 MED ORDER — LORAZEPAM 0.5 MG PO TABS: 0.5000 mg | ORAL_TABLET | Freq: Four times a day (QID) | ORAL | Status: AC | PRN

## 2011-08-15 MED ORDER — MAGNESIUM HYDROXIDE 400 MG/5ML PO SUSP: 30.0000 mL | Freq: Every day | ORAL | Status: AC | PRN

## 2011-08-15 MED ORDER — MAGNESIUM HYDROXIDE 400 MG/5ML PO SUSP: 30.0000 mL | Freq: Every day | ORAL | Status: DC | PRN

## 2011-08-15 NOTE — Consult Note (Signed)
Patient Identification:  Ian Ruiz Date of Evaluation:  08/15/2011   History of Present Illness:  Patient seen in a medical records reviewed along with the assessment.  40 year old Caucasian male with history of  Depression and cocaine abuse. Patient presents with active suicidal thoughts, increased depression over the last 6 months. Patient cites the fact that he has not been able to see his children because his family won't let him, unemployed for 6 months, no relationships, lives by himself, no savings and uses drugs. Symptoms are severe, gradually getting worse. He endorses hallucinations over the last 3 days with voices telling them that is not worth anything and constant cursing. Patient reported that he don't care now he don't want to live.  Patient is not seeing any psychiatrist in the outpatient setting and he told me he has not seen psychiatrist for a couple of years.  Patient is calm cooperative to the interview affect is constricted mood depressed is logical and goal-directed during the interview does not seem to be hallucinating or delusional alert awake oriented x3 attention and concentration fair abstract ability fair insight and judgment fair to poor.  I discussed with him about his drug abuse he told me that he uses cocaine on a couple of times in the month not regularly and he denied abusing pain pills. I asked him about his urine being positive for benzos he told me he uses  Klonopin which was prescribed to him by ED physician.   Past Medical History:     Past Medical History  Diagnosis Date  . Ulcerative colitis   . Meniere disease   . Bipolar 1 disorder, depressed       History reviewed. No pertinent past surgical history.  Filed Vitals:   08/15/11 0544  BP: 106/63  Pulse: 56  Temp: 98 F (36.7 C)  Resp: 16    Lab Results:   BMET    Component Value Date/Time   NA 137 08/14/2011 0530   K 3.8 08/14/2011 0530   CL 100 08/14/2011 0530   CO2 25  08/14/2011 0530   GLUCOSE 83 08/14/2011 0530   BUN 14 08/14/2011 0530   CREATININE 1.09 08/14/2011 0530   CALCIUM 9.6 08/14/2011 0530   GFRNONAA 82* 08/14/2011 0530   GFRAA >90 08/14/2011 0530    Allergies:  Allergies  Allergen Reactions  . Penicillins Rash    Current Medications:  Prior to Admission medications   Medication Sig Start Date End Date Taking? Authorizing Provider  cyclobenzaprine (FLEXERIL) 10 MG tablet Take 1 tablet (10 mg total) by mouth 3 (three) times daily as needed for muscle spasms. 08/06/11 08/16/11 Yes Shelda Jakes, MD  dicyclomine (BENTYL) 20 MG tablet Take 1 tablet (20 mg total) by mouth 2 (two) times daily. 08/12/11 08/11/12 Yes Lisette Paz, PA  naproxen (NAPROSYN) 500 MG tablet Take 1 tablet (500 mg total) by mouth 2 (two) times daily. 08/06/11 08/05/12 Yes Shelda Jakes, MD  ondansetron (ZOFRAN ODT) 4 MG disintegrating tablet Take 1 tablet (4 mg total) by mouth every 6 (six) hours as needed for nausea. 08/12/11 08/19/11 Yes Lisette Paz, PA  oxyCODONE-acetaminophen (PERCOCET) 5-325 MG per tablet Take 2 tablets by mouth every 4 (four) hours as needed for pain. 08/12/11 08/22/11 Yes Jaci Carrel, PA    Social History:    reports that he has quit smoking. He has never used smokeless tobacco. He reports that he uses illicit drugs (Marijuana). He reports that he does not drink alcohol.  Family History:    History reviewed. No pertinent family history.   DIAGNOSIS:   AXIS I  mood disorder NOS   AXIS II  Deffered  AXIS III See medical notes.  AXIS IV  number of psychosocial stressors   AXIS V 35     Recommendations: I discussed with him about different medications as patient has no insurance so limited options available I will start him on Celexa 20 mg by mouth daily along with Haldol 2 mg at bedtime and Benadryl 50 mg at bedtime.  Will admit him in the inpatient setting for further stabilization.    Eulogio Ditch, MD
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
## 2011-08-15 NOTE — ED Provider Notes (Addendum)
Pt is comfortable at this time.  No acute medical issues.  Pt is awaiting placement at [facility].    Nat Christen, MD 08/15/11 0730   Pt accepted to [facility] by Dr. Rogers Blocker for transfer.    Nat Christen, MD 08/15/11 1149

## 2011-08-15 NOTE — ED Notes (Signed)
Patient requested chicken salad and soda. Provided for patient.

## 2011-08-16 DIAGNOSIS — F3130 Bipolar disorder, current episode depressed, mild or moderate severity, unspecified: Principal | ICD-10-CM

## 2011-08-16 MED ORDER — OXYCODONE-ACETAMINOPHEN 5-325 MG PO TABS
1.0000 | ORAL_TABLET | Freq: Four times a day (QID) | ORAL | Status: DC | PRN
  Administered 2011-08-16 – 2011-08-19 (×10): 2 via ORAL
  Filled 2011-08-16 (×10): qty 2

## 2011-08-16 MED ORDER — CLONAZEPAM 1 MG PO TABS
1.0000 mg | ORAL_TABLET | Freq: Three times a day (TID) | ORAL | Status: DC
  Administered 2011-08-16 – 2011-08-19 (×10): 1 mg via ORAL
  Filled 2011-08-16 (×10): qty 1

## 2011-08-16 MED ORDER — CITALOPRAM HYDROBROMIDE 10 MG PO TABS
30.0000 mg | ORAL_TABLET | Freq: Every day | ORAL | Status: DC
  Administered 2011-08-17 – 2011-08-19 (×3): 30 mg via ORAL
  Filled 2011-08-16 (×3): qty 3
  Filled 2011-08-16: qty 21
  Filled 2011-08-16: qty 3

## 2011-08-16 NOTE — Progress Notes (Signed)
Has been in bed since beginning of shift. Was initially observed having a clam conversation on the phone, but retired to bed shortly thereafter. Respirations are even and unlabored. No acute distress noted. Safety has been maintained with Q13minute observation. Will continue current POC

## 2011-08-16 NOTE — Progress Notes (Signed)
Pt has been in bed sleeping since beginning of the shift. Appears flat. Calm and cooperative with assessment. A/Ox4. No acute distress. Pt states he is doing ok, but did express some concern over snoring roommate and high temp in room. Support and encouragement provided. Offered ear plugs, but pt states he cant wear them due to a medical condition. Encouraged pt to adjust his room temp to what he feels comfortable with and we will work with roommate should he wake up and want to adjust it up again. Otherwise no questions or concerns. Denies pain or discomfort. Denies SI/HI/AVH and contracts for safety. Safety has been maintained with Q63minute observation. POC and medications for the shift reviewed and understanding verbalized. Will continue current POC.

## 2011-08-16 NOTE — Progress Notes (Signed)
Patient ID: Ian Ruiz, male   DOB: [birth year], 40 y.o.   MRN: [id number] Pt was irritable earlier today but was very pleasant after his clonazepam took effect.  He was given percocets for GI pain related to his ulcerative colitis earlier with relief.  He was anxious and depressed earlier today--both have decreased, especially his anxiety.  His request for a new room due to his roommate snoring has been communicated to the charge nurse and is awaiting discharges before moving.  His appetite is improving along with his ability to pay attention.
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
## 2011-08-16 NOTE — Progress Notes (Signed)
Grief Loss Group  Group focused on losses primarily related to relationships. Discussion also occurred around the experience of grief feelings and reactions and how to cope in healthy ways. Pt was active in group in discussing how he sees grief and need to accept things as they are. He was not specific in talking about his grief issues and seemed more interested in keeping the discussion on an intellectual level.   Ian Ruiz.Div [facility]
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
## 2011-08-16 NOTE — Progress Notes (Signed)
[facility] Group Notes: (Counselor/Nursing/MHT/Case Management/Adjunct)   Type of Therapy:  Group Therapy  Participation Level:  Active  Participation Quality:  Attentive, Appropriate, Redirectible at times, Sharing  Affect:  Excited  Cognitive:  Appropriate  Insight:  Good  Engagement in Group: Good  Engagement in Therapy:  Good  Modes of Intervention:  Support and Exploration  Summary of Progress/Problems: Kolston was very animated in discussing the concept of not knowing the answers to questions of why things happen. He talked about he fear that so many people experience over the unknown, and that need to "fix" things rather than accept them. He seemed to get hung up a bit on the political side of this, and had to be redirected to be more internally focused. Raun identified that although he logically understands that he cannot control outside circumstances and that he is not know get know all the answers or solutions, he still badly wants to fix things in his life that have not turned out perfectly. He explored the thought that society sees that at his age he should be successful and winding up his life, but he is starting over again and unsure how to rebuild after all he has been through. [name] 08/16/2011  1:03 PM

## 2011-08-16 NOTE — Progress Notes (Signed)
2:44 PM 08/16/2011                                   Pt [facility] in [place] with [place] as the contact person for that particular shelter. Pt was concerned he would loose his bed, [facility] was called at [phone] at pt will not loose a bed- pt will need to contact [facility] at discharge for bed hopefully at same location.Dejae Bernet, LPCA

## 2011-08-16 NOTE — Progress Notes (Signed)
08/16/2011 10:24 AM                                        Case Management Note: Pt attended after care group, pt at the hospital for SI with plan to OD on pills like his sister did 4 years ago. Pt shared he lacks support and has no job. Pt has been living [facility] [employer], pt would like case Production designer, theatre/television/film to call and try to secure bed after discharge. Pt reports depression currently at 9, and anxiety at 9. Jessilyn Catino, LPCA

## 2011-08-16 NOTE — Progress Notes (Signed)
[facility] Group Notes: (Counselor/Nursing/MHT/Case Management/Adjunct)   Type of Therapy:  Group Therapy  Participation Level:  Active  Participation Quality:  Attentive, Monopolizing  Affect:  Appropriate  Cognitive:  Appropriate  Insight:  Good  Engagement in Group: Good  Engagement in Therapy:  Limited  Modes of Intervention:  Support and Exploration  Summary of Progress/Problems: Ian Ruiz came to group late but was very engaged from the time he arrived. He talked about labels as separating groups of people and tried to help another group member process her intent in labeling others as stupid when she knows they are not. Ian Ruiz discussed the medical model of mental health and although he has a strong understanding of it, seemed to want to takeover and teach the group rather than allowing others to participate. He seemed to have trouble understanding the Recovery Model, stating that people need to get stabilized first before they can focus on thriving past that. Once he came to understand the model, he was very inspirational talking about what people can be despite a mental illness.   [name] 08/16/2011  2:44 PM

## 2011-08-17 DIAGNOSIS — F341 Dysthymic disorder: Secondary | ICD-10-CM | POA: Diagnosis present

## 2011-08-17 DIAGNOSIS — F319 Bipolar disorder, unspecified: Secondary | ICD-10-CM

## 2011-08-17 MED ORDER — NICOTINE 21 MG/24HR TD PT24
MEDICATED_PATCH | TRANSDERMAL | Status: AC
  Filled 2011-08-17: qty 1

## 2011-08-17 MED ORDER — NICOTINE 21 MG/24HR TD PT24
21.0000 mg | MEDICATED_PATCH | Freq: Every day | TRANSDERMAL | Status: DC
  Administered 2011-08-17 – 2011-08-19 (×3): 21 mg via TRANSDERMAL
  Filled 2011-08-17 (×5): qty 1

## 2011-08-17 NOTE — Progress Notes (Signed)
Approached Clinical research associate requesting prn for  Pain. Appears flat and depressed. Calm and cooperative with assessment. A/Ox4. No acute distress. States he fells like his mood is improving, but c/o hunger and abd pain r/t clear liquid diet. Reinforced MD orders for diet, but pt appears intent on eating. Discussed with AC who is going out to get Jello and chicken broth. Will continue to reinforce. PRN provided for pain. Otherwise no questions or concerns. Denies SI/Hi/AVH and contracts for safety. Medications and POC for the shift reviewed and understanding verbalized. Safety has been maintained with Q91minute observation. Will f/u response to prn and continue current POC.

## 2011-08-17 NOTE — Progress Notes (Signed)
Patient ID: Ian Ruiz, male   DOB: 1970/09/14, 40 y.o.   MRN: [id number] Pt is awake and active on the unit this AM. Pt denies SI/HI or A/V hallucinations at this time. Pt endorses passive SI within the last 24 hours, but he does contract for safety. Pt did not wish to participate in discharge planning group today due to privacy issues. Pt states that he is willing to attend tx team with only staff present. Pt minimizes staff qualifications as counselors and case managers stating that "he could get certified himself." Pt is somewhat agitated today but is cooperative with staff. Writer will continue to monitor.

## 2011-08-17 NOTE — Progress Notes (Signed)
[facility] Group Notes:  (Counselor/Nursing/MHT/Case Management/Adjunct)    Type of Therapy:  Group Therapy  Participation Level:  Did Not Attend    Ian Ruiz 08/17/2011  2:02 PM

## 2011-08-17 NOTE — Progress Notes (Signed)
Recreation Therapy Group Note  Date: 08/17/2011         Time: 1415      Group Topic/Focus: The focus of this group is on enhancing patients' problem solving skills, which involves identifying the problem, brainstorming solutions and choosing and trying a solution.    Participation Level: Active  Participation Quality: Appropriate and Attentive  Affect: Irritable  Cognitive: Oriented   Additional Comments: Patient became agitated when peers did not listen to his ideas, or corrected him when he was wrong.  Ian Ruiz 08/17/2011 3:05 PM

## 2011-08-17 NOTE — Progress Notes (Signed)
Patient seen to assess for discharge planning need.  He left group before answering any questions.  He advised of being depressed and suicidal for the past several weeks.  He shared problems with family and being unemployed as stressors.  He advised that he is currently living at the [facility] at [facility]  He endorses using ETOH and THC recreationally.  Patient rates depression and anxiety at six and helplessness/hopelessness at four.  He denies SI/HI at this time.

## 2011-08-17 NOTE — Progress Notes (Signed)
Patient ID: Ian Ruiz, male   DOB: Jan 18, 1971, 40 y.o.   MRN: [id number] Two seasoned techs met me as I entered the hospital this AM with reports of extreme frustration with patient as he was very uncooperative with groups and had a condescending attitude that he doesn't have to cooperate with attending groups and none of the staff have credentials that are all that special.  He was reported as loud in his accusations about the staff playing therapists and not really knowing him all that well.    He was confronted in treatment team with an eye witness RN and the nurse practitioner. He became rather defensive for the first 15-20 minutes and continued to discount and minimize the time I had spent with him yesterday.  He was able to finally hear that we were approaching him with observations from our brief interactions with him and could he look more closely at the reactions he gets from others so as to break down his behaviors that precipitate these repeated responses from others.  He was able to calmly say that he might ought do that.  He was championed for hearing what we were saying as feedback from which he could learn.   He reported his depression as 6 or 7 compared to yesterday and anxiety is lower than yesterday too.

## 2011-08-18 NOTE — Progress Notes (Signed)
Patient ID: Ian Ruiz, male   DOB: December 13, 1970, 40 y.o.   MRN: [id number] Pt is awake in bed this AM, but is spending much of his time in his room. Pt c/o minor ulcerative colitis but he is eating solid food. Pt denies SI/HI and is cooperative with staff. MHT staff reported to writer that pt had been invading the space of two male pt's during lunch by standing in their personal space, staring and looking down their shirts. Writer addressed these concerns with the pt in private after lunch and requested that he modify his behavior and be aware that others were being made to feel uncomfortable. Pt seemed surprised but accepted the feedback. Again after diner MHT staff reported the same behavior again stating that the other male pt's body language indicated that she was very uncomfortable around him and that he was staring at her and following her around. Pt also complimented her for being an "attractive girl" and asked if she had a boyfriend. Pt states that she initiated conversation with him and was extremely defensive when confronted again by staff this evening. [name] C and myself discussed the issue with him. The pt was very offended by the situation. However, afterwards the writer spoke with him some more and encouraged him to be considerate of others feelings and to be aware of himself when interacting with other pt's, and also to learn from the experience. Pt seemed to be responsive although his mood was depressed and affect was sad. Writer informed oncoming nurse of the situation in order to monitor behavior.

## 2011-08-18 NOTE — Progress Notes (Signed)
Patient seen during d/c planning group.  He advised of being much better today.  He reports depression has decreased and rated it at four and anxiety at seven.  Patient stated that his family has invited him to spend the holiday with them and that his mother will be purchasing a train ticket for him to travel.  Patient advised he hopes to discharge by Saturday.

## 2011-08-18 NOTE — Progress Notes (Signed)
[facility] Group Notes:  (Counselor/Nursing/MHT/Case Management/Adjunct)    Type of Therapy:  Group Therapy  Participation Level:  Did Not Attend    Ian Ruiz Taree Hayes 08/18/2011  5:16 PM   

## 2011-08-18 NOTE — Progress Notes (Signed)
Patient ID: Ian Ruiz, male   DOB: 08-11-1971, 40 y.o.   MRN: [id number]  Micah Flesher to patient's room to reassess pain and to do assessment and patient is resting quietly in bed with eyes closed. Respirations even, normal and unlabored. Will continue monitoring patient q15 minutes.
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
## 2011-08-19 MED ORDER — CLONAZEPAM 1 MG PO TABS: 1.0000 mg | ORAL_TABLET | Freq: Three times a day (TID) | ORAL | Status: AC

## 2011-08-19 MED ORDER — CITALOPRAM HYDROBROMIDE 10 MG PO TABS: 30.0000 mg | ORAL_TABLET | Freq: Every day | ORAL | Status: DC

## 2011-08-19 NOTE — Progress Notes (Signed)
Suicide Risk Assessment  Discharge Assessment     Demographic factors:  Assessment Details Time of Assessment: Discharge Information Obtained From: Patient Current Mental Status:  Current Mental Status: Suicidal ideation indicated by patient;Suicide plan;Plan includes specific time, place, or method;Self-harm thoughts Risk Reduction Factors:  Risk Reduction Factors: Responsible for children under 42 years of age  CLINICAL FACTORS:   Severe Anxiety and/or Agitation Depression:   Hopelessness Chronic Pain Previous Psychiatric Diagnoses and Treatments Medical Diagnoses and Treatments/Surgeries  COGNITIVE FEATURES THAT CONTRIBUTE TO RISK:  Thought constriction (tunnel vision)    SUICIDE RISK:   Minimal: No identifiable suicidal ideation.  Patients presenting with no risk factors but with morbid ruminations; may be classified as minimal risk based on the severity of the depressive symptoms  Patient denies suicidal or homicidal ideation, hallucinations, illusions, or delusions. Patient engages with good eye contact, is able to focus adequately in a one to one setting, and has clear goal directed thoughts. Patient speaks with a natural conversational volume, rate, and tone. Anxiety was reported at 2 on a scale of 1 the least and 10 the most. Depression was reported at 2 on the same scale. Patient is oriented times 4, recent and remote memory intact. Judgement: somewhat improved from admission Insight: limited, but reports that he has learned that he needs to be aware. Plan: Remain on . citalopram  30 mg Oral Daily  . clonazePAM  1 mg Oral TID  . nicotine  21 mg Transdermal Daily  Because Celexa helps depression, Klonopin helps anxiety, but causes memory loss, nicotine helps with cravings for cigarettes, use Tegretol to prevent seizures from stopping Klonopin Pt voices understanding of the risks and benefits of medication. Follow-up is with Follow-up Information    Follow up with Dr.  Merilynn Finland - [place] on 08/26/2011. (Your appointment with Dr. Merilynn Finland is scheduled for 10:00 a.m. on Friday, August 26, 2011.  Please arrive 30 minutes early for your appointment )    Contact information:   [address] [place], [place]  [address]  [phone]       Orson Aloe 08/19/2011, 1:18 PM

## 2011-08-19 NOTE — Discharge Summary (Signed)
Patient ID: Ian Ruiz MRN: [id number] DOB/AGE: 42-24-1972 42 y.o.  Admit date: 08/15/2011 Discharge date: 08/19/2011  Reason for Admission: Ian Ruiz is a 40 year old Caucasian male, admitted from the [facility] ED with complaints of suicidal ideation, increased depression and inability to see his children x 6 months. Patient reports, "I started having suicidal thoughts few days ago. I was thinking about walking in front of a moving traffic, jump over a bridge, cut my wrist and take an overdose on some medicine. I have been out of work x 6 months.  Hospital Course:  Pt was admitted and started on Celexa and back on his Klonopin with good results.  He noted benefit without side effects on them.   He described on discharge that he needs to be more aware of his mood and anxiety and to take more care of hisself.  Discharge Diagnoses:  Principal Problem:  *Dysthymic disorder   Condition on  Discharge: Patient denies suicidal or homicidal ideation, hallucinations, illusions, or delusions. Patient engages with good eye contact, is able to focus adequately in a one to one setting, and has clear goal directed thoughts. Patient speaks with a natural conversational volume, rate, and tone. Anxiety was reported at 2 on a scale of 1 the least and 10 the most. Depression was reported at 2 on the same scale. Patient is oriented times 4, recent and remote memory intact. Judgement: somewhat improved from admission Insight: limited, but reports that he has learned that he needs to be aware. Plan:   Current Discharge Medication List    START taking these medications   Details  citalopram (CELEXA) 10 MG tablet Take 3 tablets (30 mg total) by mouth daily. Qty: 90 tablet, Refills: 0    clonazePAM (KLONOPIN) 1 MG tablet Take 1 tablet (1 mg total) by mouth 3 (three) times daily. Qty: 30 tablet, Refills: 0      CONTINUE these medications which have NOT CHANGED   Details  dicyclomine  (BENTYL) 20 MG tablet Take 1 tablet (20 mg total) by mouth 2 (two) times daily. Qty: 20 tablet, Refills: 0    naproxen (NAPROSYN) 500 MG tablet Take 1 tablet (500 mg total) by mouth 2 (two) times daily. Qty: 20 tablet, Refills: 0    ondansetron (ZOFRAN ODT) 4 MG disintegrating tablet Take 1 tablet (4 mg total) by mouth every 6 (six) hours as needed for nausea. Qty: 6 tablet, Refills: 0    oxyCODONE-acetaminophen (PERCOCET) 5-325 MG per tablet Take 2 tablets by mouth every 4 (four) hours as needed for pain. Qty: 15 tablet, Refills: 0      STOP taking these medications     cyclobenzaprine (FLEXERIL) 10 MG tablet        Follow-up Information    Follow up with Dr. Merilynn Finland - [facility] on 08/26/2011. (Your appointment with Dr. Merilynn Finland is scheduled for 10:00 a.m. on Friday, August 26, 2011.  Please arrive 30 minutes early for your appointment )    Contact information:   [address] [place], [place]  [address]  [phone]       Signed: Orson Aloe 08/19/2011, 1:32 PM

## 2011-08-19 NOTE — Progress Notes (Addendum)
Patient seen during during d/c planning group and or treatment team.  He reports doing really well and looks forward to discharging today.  Patient denies SI/HI and depression.  Patient asked that the [facility] [place] be notified of his hospital discharge.  He was assisted with indigent medications and a bus pass.  Suicide prevention education reviewed.  Barrier to discharge is lack of permanent housing

## 2011-08-19 NOTE — Progress Notes (Addendum)
Patient ID: Ian Ruiz, male   DOB: 10-22-1970, 40 y.o.   MRN: [id number] Writer reviewed discharge instructions with pt including medications, follow up appointments and crisis intervention. Pt verbally acknowledged instructions and stated that he is prepared to leave [facility] today. Pt was concerned about difficulty filling prescriptions. Staff assisted pt to fill needed prescriptions at a discount through [facility] pharmacy due to pt financial hardship. Pt belongings returned from locker and pt was discharged into his own care. After lunch today, several male pt's and one male pt complained that the pt had made them uncomfortable with inapropriate sexual behavior by staring and invading the personal space of others. This issue has been addressed with the pt twice in the past, yet the behavior has not yet been adjusted. Pt is scheduled for d/c this afternoon.

## 2011-08-19 NOTE — Tx Team (Signed)
Interdisciplinary Treatment Plan Update (Adult)  Date:  08/19/2011  Time Reviewed:  10:13 AM   Progress in Treatment: Attending groups: Yes Participating in groups:  Yes Taking medication as prescribed:  Yes Tolerating medication: Yes Family/Significant othe contact made:  No, refused to allow contact  Patient understands diagnosis: Yes Discussing patient identified problems/goals with staff:  Yes Medical problems stabilized or resolved: Yes Denies suicidal/homicidal ideation: Yes Issues/concerns per patient self-inventory:  No  Other:  New problem(s) identified: None  Reason for Continuation of Hospitalization:   Interventions implemented related to continuation of hospitalization:  Medication stabilization, safety checks q 15 mins, group attendance  Additional comments:  Estimated length of stay: discharge today  Discharge Plan:  Discharge home, follow up with [facility]  New goal(s):  Review of initial/current patient goals per problem list:   1. Goal(s): Pt will have a decrease in depressive symptoms from a 10 to a 3  Met: Yes Target date: discharge  As evidenced by: Pt reports his depression is at a 0 2. Goal (s): Pt will report decrease in anxiety from a 10 to a 3  Met: Yes Target date: discharge  As evidenced by: pt reports current anxiety at a 0 3. Goal(s): Pt will report a decrease in SI by reporting no SI and no current plan  Met: Yes Target date: discharge  As evidenced by: denies any suicidal thoughts  Attendees: Patient:  Ian Ruiz 08/19/2011 10:13 AM  Family:   08/19/2011 10:13 AM  Physician:  Dr Orson Aloe, MD 08/19/2011 10:13 AM  Nursing:   Cato Mulligan, RN 08/19/2011 10:13 AM  Case Manager:  Juline Patch, LCSW 08/19/2011 10:13 AM  Counselor:  Angus Palms, LCSW 08/19/2011 10:13 AM  Other:  Reyes Ivan, LCSWA 08/19/2011 10:13 AM  Other:   08/19/2011 10:13 AM  Other:   08/19/2011 10:13 AM  Other:   08/19/2011 10:13 AM   Scribe  for Treatment Team:   [name], 08/19/2011 10:13 AM

## 2011-08-19 NOTE — Discharge Summary (Signed)
Discharge Note  Patient:  Ian Ruiz is an 40 y.o., male DOB:  03/27/1971  Date of Admission:  08/15/2011  Date of Discharge:  08/19/2011  Level of Care:  OP  Discharge destination:  HOME  Is patient on multiple antipsychotic therapies at discharge:  NO  Patient phone:  [phone] (home) Patient address:   [address] [place] [address]  The patient received suicide prevention pamphlet:  YES Belongings returned:  Judene Companion, Taraneh Metheney 08/19/2011,1:32 PM

## 2011-08-19 NOTE — Progress Notes (Signed)
Patient ID: Ian Ruiz, male   DOB: 1971-03-09, 40 y.o.   MRN: [id number] Pt is awake and active on the unit this AM. Pt denies SI/HI and is cooperative with staff. Pt attended breakfast this AM and stated that he avoided contact with the male involved in yesterday's conflict. Pt is attending groups. Writer will continue to monitor.

## 2011-08-19 NOTE — Progress Notes (Signed)
[facility] Group Notes: (Counselor/Nursing/MHT/Case Management/Adjunct)   Type of Therapy:  Group Therapy  Participation Level:  None  Participation Quality:  Anxious  Affect:  Anxious  Cognitive:  Appropriate  Insight:  None  Engagement in Group: Minimal  Engagement in Therapy:  None  Modes of Intervention:  Support and Exploration  Summary of Progress/Problems: Ian Ruiz can in and out of group, muttering about wanting to be seen by treatment team and need to go home. He did not engage in group while present.   [name] 08/19/2011  3:49 PM

## 2011-08-19 NOTE — Progress Notes (Signed)
[facility] Family/Significant Other Suicide Prevention Education  Suicide Prevention Education:  Patient Refusal for Family/Significant Other Suicide Prevention Education: The patient Ian Ruiz has refused to provide written consent for family/significant other to be provided Family/Significant Other Suicide Prevention Education during admission and/or prior to discharge.  Physician notified.  [name] reports he has no supports he wants contacted at this time. Counselor reviewed suicide prevention with [name] and provided him with  suicide prevention pamphlet. He verbalized understanding and had no questions.  [name] 08/19/2011, 10:05 AM

## 2011-08-22 NOTE — Progress Notes (Signed)
Patient Discharge Instructions:  Admission Note Faxed,  12/24 Discharge Note Faxed,   12/24 After Visit Summary Faxed,  12/24 Faxed to the Next Level Care provider:  Dimple Casey, Barbaraann Share, 08/22/2011, 1:35 PM

## 2011-08-25 NOTE — Progress Notes (Signed)
Patient Discharge Instructions:  Admission Note Faxed,  08/25/2011 Discharge Note Faxed,   08/25/2011 After Visit Summary Faxed,  08/25/2011 Faxed to the Next Level Care provider:  08/25/2011 Facesheet faxed 08/25/2011  Re-faxed to [facility] @ [phone]  Wandra Scot, 08/25/2011, 2:52 PM

## 2011-09-08 ENCOUNTER — Encounter (HOSPITAL_COMMUNITY): Payer: Self-pay

## 2011-09-08 ENCOUNTER — Emergency Department (HOSPITAL_COMMUNITY): Payer: Self-pay

## 2011-09-08 ENCOUNTER — Emergency Department (HOSPITAL_COMMUNITY)
Admission: EM | Admit: 2011-09-08 | Discharge: 2011-09-08 | Disposition: A | Discharge status: Home / Self Care | Payer: Self-pay | Attending: Emergency Medicine | Admitting: Emergency Medicine

## 2011-09-08 DIAGNOSIS — R1031 Right lower quadrant pain: Secondary | ICD-10-CM | POA: Insufficient documentation

## 2011-09-08 DIAGNOSIS — R05 Cough: Secondary | ICD-10-CM | POA: Insufficient documentation

## 2011-09-08 DIAGNOSIS — F3130 Bipolar disorder, current episode depressed, mild or moderate severity, unspecified: Secondary | ICD-10-CM | POA: Insufficient documentation

## 2011-09-08 DIAGNOSIS — Z79899 Other long term (current) drug therapy: Secondary | ICD-10-CM | POA: Insufficient documentation

## 2011-09-08 DIAGNOSIS — F1910 Other psychoactive substance abuse, uncomplicated: Secondary | ICD-10-CM | POA: Insufficient documentation

## 2011-09-08 DIAGNOSIS — J3489 Other specified disorders of nose and nasal sinuses: Secondary | ICD-10-CM | POA: Insufficient documentation

## 2011-09-08 DIAGNOSIS — R112 Nausea with vomiting, unspecified: Secondary | ICD-10-CM | POA: Insufficient documentation

## 2011-09-08 DIAGNOSIS — R059 Cough, unspecified: Secondary | ICD-10-CM | POA: Insufficient documentation

## 2011-09-08 DIAGNOSIS — R197 Diarrhea, unspecified: Secondary | ICD-10-CM | POA: Insufficient documentation

## 2011-09-08 DIAGNOSIS — R10813 Right lower quadrant abdominal tenderness: Secondary | ICD-10-CM | POA: Insufficient documentation

## 2011-09-08 LAB — DIFFERENTIAL
Basophils Absolute: 0 10*3/uL (ref 0.0–0.1)
Basophils Relative: 0 % (ref 0–1)
Eosinophils Absolute: 0.3 10*3/uL (ref 0.0–0.7)
Eosinophils Relative: 3 % (ref 0–5)
Lymphocytes Relative: 18 % (ref 12–46)
Monocytes Absolute: 0.7 10*3/uL (ref 0.1–1.0)

## 2011-09-08 LAB — RAPID URINE DRUG SCREEN, HOSP PERFORMED
Benzodiazepines: NOT DETECTED
Cocaine: POSITIVE — AB
Opiates: POSITIVE — AB
Tetrahydrocannabinol: POSITIVE — AB

## 2011-09-08 LAB — CBC
HCT: 35.3 % — ABNORMAL LOW (ref 39.0–52.0)
MCH: 29.8 pg (ref 26.0–34.0)
MCHC: 33.1 g/dL (ref 30.0–36.0)
MCV: 90.1 fL (ref 78.0–100.0)
Platelets: 275 10*3/uL (ref 150–400)
RDW: 13.5 % (ref 11.5–15.5)

## 2011-09-08 LAB — COMPREHENSIVE METABOLIC PANEL
AST: 13 U/L (ref 0–37)
CO2: 27 mEq/L (ref 19–32)
Calcium: 8.4 mg/dL (ref 8.4–10.5)
Creatinine, Ser: 1.04 mg/dL (ref 0.50–1.35)
GFR calc Af Amer: >90 mL/min (ref >90)
GFR calc non Af Amer: 87 mL/min — ABNORMAL LOW (ref >90)
Sodium: 142 mEq/L (ref 135–145)
Total Protein: 6.5 g/dL (ref 6.0–8.3)

## 2011-09-08 LAB — URINALYSIS, ROUTINE W REFLEX MICROSCOPIC
Bilirubin Urine: NEGATIVE
Hgb urine dipstick: NEGATIVE
Ketones, ur: NEGATIVE mg/dL
Specific Gravity, Urine: 1.021 (ref 1.005–1.030)
pH: 5.5 (ref 5.0–8.0)

## 2011-09-08 MED ORDER — HYDROMORPHONE HCL PF 1 MG/ML IJ SOLN
1.0000 mg | Freq: Once | INTRAMUSCULAR | Status: AC
  Administered 2011-09-08: 1 mg via INTRAVENOUS
  Filled 2011-09-08: qty 1

## 2011-09-08 MED ORDER — PROMETHAZINE HCL 25 MG PO TABS: 25.0000 mg | ORAL_TABLET | Freq: Four times a day (QID) | ORAL | Status: AC | PRN

## 2011-09-08 MED ORDER — ONDANSETRON HCL 4 MG/2ML IJ SOLN
4.0000 mg | Freq: Once | INTRAMUSCULAR | Status: AC
  Administered 2011-09-08: 4 mg via INTRAVENOUS
  Filled 2011-09-08: qty 2

## 2011-09-08 MED ORDER — SODIUM CHLORIDE 0.9 % IV SOLN: INTRAVENOUS | Status: DC

## 2011-09-08 MED ORDER — SODIUM CHLORIDE 0.9 % IV BOLUS (SEPSIS)
500.0000 mL | Freq: Once | INTRAVENOUS | Status: AC
  Administered 2011-09-08: 1000 mL via INTRAVENOUS

## 2011-09-08 NOTE — ED Notes (Signed)
Gave pt ginger ale to drink. 

## 2011-09-08 NOTE — ED Notes (Signed)
Pt states that for the past few days he has been having uri s/s with cough and congestion. He states that he has also been having lower abdominal pain. Pt has hx of colitis and states that he has been unable to eat or drink much due to nausea and vomiting. He states that his stools have also been looking very dark. No meds pta.

## 2011-09-08 NOTE — ED Notes (Signed)
Patient is resting.  He complains of ongoing abd pain.  8/10.  Patient has requested more pain medications.  ERMD aware and refused at this time.  Patient given ginger ale.  Will continue to monitor.

## 2011-09-08 NOTE — ED Provider Notes (Signed)
History     CSN: [id number]  Arrival date & time 09/08/11  4098   First MD Initiated Contact with Patient 09/08/11 [phone]      Chief Complaint  Patient presents with  . URI  . Abdominal Pain  . Emesis  . Diarrhea    (Consider location/radiation/quality/duration/timing/severity/associated sxs/prior treatment) Patient is a 41 y.o. male presenting with URI, abdominal pain, vomiting, and diarrhea. The history is provided by the patient.  URI The primary symptoms include cough, abdominal pain, nausea and vomiting. Primary symptoms do not include headaches or rash.  Abdominal Pain The primary symptoms of the illness include abdominal pain, nausea, vomiting and diarrhea. The primary symptoms of the illness do not include shortness of breath.  Symptoms associated with the illness do not include back pain.  Emesis  Associated symptoms include abdominal pain, cough, diarrhea and URI. Pertinent negatives include no headaches.  Diarrhea The primary symptoms include abdominal pain, nausea, vomiting and diarrhea. Primary symptoms do not include rash.  The illness does not include back pain.   patient is a cough and congestion for last few days. No fevers. He states he's also had lower abdominal pain. He states his history of colitis. He states he is unable to eat or drink due to nausea vomiting. He's also had diarrhea. She feels dehydrated. No fevers.  Past Medical History  Diagnosis Date  . Ulcerative colitis   . Meniere disease   . Bipolar 1 disorder, depressed     History reviewed. No pertinent past surgical history.  History reviewed. No pertinent family history.  History  Substance Use Topics  . Smoking status: Former Smoker    Quit date: 08/14/2001  . Smokeless tobacco: Never Used  . Alcohol Use: No     x 2 days ago, last use      Review of Systems  Constitutional: Negative for activity change and appetite change.  HENT: Negative for neck stiffness.   Eyes: Negative for  pain.  Respiratory: Positive for cough. Negative for chest tightness and shortness of breath.   Cardiovascular: Negative for chest pain and leg swelling.  Gastrointestinal: Positive for nausea, vomiting, abdominal pain and diarrhea.  Genitourinary: Negative for flank pain.  Musculoskeletal: Negative for back pain.  Skin: Negative for rash.  Neurological: Negative for weakness, numbness and headaches.  Psychiatric/Behavioral: Negative for behavioral problems.    Allergies  Penicillins  Home Medications   Current Outpatient Rx  Name Route Sig Dispense Refill  . CITALOPRAM HYDROBROMIDE 10 MG PO TABS Oral Take 3 tablets (30 mg total) by mouth daily. 90 tablet 0  . CLONAZEPAM 1 MG PO TABS Oral Take 1 tablet (1 mg total) by mouth 3 (three) times daily. 30 tablet 0  . DICYCLOMINE HCL 20 MG PO TABS Oral Take 1 tablet (20 mg total) by mouth 2 (two) times daily. 20 tablet 0  . PRESCRIPTION MEDICATION  Pain medication    . PROMETHAZINE HCL 25 MG PO TABS Oral Take 1 tablet (25 mg total) by mouth every 6 (six) hours as needed for nausea. 30 tablet 0    BP 122/78  Pulse 82  Temp(Src) 97.8 F (36.6 C) (Oral)  Resp 16  SpO2 98%  Physical Exam  Nursing note and vitals reviewed. Constitutional: He is oriented to person, place, and time. He appears well-developed and well-nourished.  HENT:  Head: Normocephalic and atraumatic.  Eyes: EOM are normal. Pupils are equal, round, and reactive to light.  Neck: Normal range of motion. Neck supple.  Cardiovascular: Normal rate, regular rhythm and normal heart sounds.   No murmur heard. Pulmonary/Chest: Effort normal.       Harsh breath sounds throughout.  Abdominal: Soft. Bowel sounds are normal. He exhibits no distension and no mass. There is tenderness. There is no rebound and no guarding.       Right lower quadrant tenderness without rebound or guarding  Musculoskeletal: Normal range of motion. He exhibits no edema.  Neurological: He is alert and  oriented to person, place, and time. No cranial nerve deficit.  Skin: Skin is warm and dry.  Psychiatric: He has a normal mood and affect.    ED Course  Procedures (including critical care time)  Labs Reviewed  CBC - Abnormal; Notable for the following:    WBC 11.0 (*)    RBC 3.92 (*)    Hemoglobin 11.7 (*)    HCT 35.3 (*)    All other components within normal limits  DIFFERENTIAL - Abnormal; Notable for the following:    Neutro Abs 8.0 (*)    All other components within normal limits  COMPREHENSIVE METABOLIC PANEL - Abnormal; Notable for the following:    Albumin 3.2 (*)    Total Bilirubin 0.1 (*)    GFR calc non Af Amer 87 (*)    All other components within normal limits  URINE RAPID DRUG SCREEN (HOSP PERFORMED) - Abnormal; Notable for the following:    Opiates POSITIVE (*)    Cocaine POSITIVE (*)    Tetrahydrocannabinol POSITIVE (*)    All other components within normal limits  LIPASE, BLOOD  URINALYSIS, ROUTINE W REFLEX MICROSCOPIC   Dg Abd Acute W/chest  09/08/2011  *RADIOLOGY REPORT*  Clinical Data: Right lower quadrant pain. Ulcerative colitis.  ACUTE ABDOMEN SERIES (ABDOMEN 2 VIEW & CHEST 1 VIEW)  Comparison: None.  Findings: The bowel gas pattern is within normal limits.  No evidence of air-fluid levels or free air.  Heart size is normal.  Mild central peribronchial thickening is noted, however there is no evidence of pulmonary infiltrate or pleural effusion.  Calcified granuloma seen in the lateral right lung base, as shown on recent abdomen CT images on 08/01/2011.  IMPRESSION: No acute findings or other significant radiographic abnormality.  Original Report Authenticated By: Danae Orleans, M.D.     1. Abdominal pain   2. Substance abuse       MDM  Acute on chronic abdominal pain. Sig patient states he's had colitis. Patient received pain medicines here. His head 6 visits to the ER the last month and a half. He's had 2 CAT scans did not pass initial colitis,  although one had a possible diverticulitis. White count is reassuring. Patient has been admitted to the hospital for depression and substance abuse. He is cocaine positive on urinalysis patient states that is impossible. States he does not use any of that. He was in formula back to her pain medicines here. He'll be discharged with nausea meds and GI followup. He states that he feels like going home.        Juliet Rude. Rubin Payor, MD 09/08/11 1154

## 2011-09-19 ENCOUNTER — Encounter (HOSPITAL_COMMUNITY): Payer: Self-pay

## 2011-09-19 ENCOUNTER — Other Ambulatory Visit: Payer: Self-pay

## 2011-09-19 ENCOUNTER — Emergency Department (HOSPITAL_COMMUNITY): Payer: Self-pay

## 2011-09-19 ENCOUNTER — Emergency Department (HOSPITAL_COMMUNITY)
Admission: EM | Admit: 2011-09-19 | Discharge: 2011-09-19 | Disposition: A | Discharge status: Other Acute Inpatient Hospital | Payer: Self-pay | Attending: Emergency Medicine | Admitting: Emergency Medicine

## 2011-09-19 ENCOUNTER — Inpatient Hospital Stay (HOSPITAL_COMMUNITY)
Admission: EM | Admit: 2011-09-19 | Discharge: 2011-10-07 | DRG: 682 | Disposition: A | Discharge status: Other Acute Inpatient Hospital | Payer: PRIVATE HEALTH INSURANCE | Attending: Family Medicine | Admitting: Family Medicine

## 2011-09-19 DIAGNOSIS — F17200 Nicotine dependence, unspecified, uncomplicated: Secondary | ICD-10-CM | POA: Insufficient documentation

## 2011-09-19 DIAGNOSIS — N179 Acute kidney failure, unspecified: Secondary | ICD-10-CM | POA: Diagnosis present

## 2011-09-19 DIAGNOSIS — E43 Unspecified severe protein-calorie malnutrition: Secondary | ICD-10-CM | POA: Diagnosis present

## 2011-09-19 DIAGNOSIS — T40904A Poisoning by unspecified psychodysleptics [hallucinogens], undetermined, initial encounter: Secondary | ICD-10-CM | POA: Insufficient documentation

## 2011-09-19 DIAGNOSIS — F3130 Bipolar disorder, current episode depressed, mild or moderate severity, unspecified: Secondary | ICD-10-CM | POA: Diagnosis present

## 2011-09-19 DIAGNOSIS — R45851 Suicidal ideations: Secondary | ICD-10-CM

## 2011-09-19 DIAGNOSIS — N390 Urinary tract infection, site not specified: Secondary | ICD-10-CM | POA: Diagnosis present

## 2011-09-19 DIAGNOSIS — T424X4A Poisoning by benzodiazepines, undetermined, initial encounter: Secondary | ICD-10-CM | POA: Insufficient documentation

## 2011-09-19 DIAGNOSIS — T1491XA Suicide attempt, initial encounter: Secondary | ICD-10-CM

## 2011-09-19 DIAGNOSIS — T6592XA Toxic effect of unspecified substance, intentional self-harm, initial encounter: Secondary | ICD-10-CM | POA: Insufficient documentation

## 2011-09-19 DIAGNOSIS — K5190 Ulcerative colitis, unspecified, without complications: Secondary | ICD-10-CM | POA: Insufficient documentation

## 2011-09-19 DIAGNOSIS — F1910 Other psychoactive substance abuse, uncomplicated: Secondary | ICD-10-CM | POA: Diagnosis present

## 2011-09-19 DIAGNOSIS — Z79899 Other long term (current) drug therapy: Secondary | ICD-10-CM | POA: Insufficient documentation

## 2011-09-19 DIAGNOSIS — J189 Pneumonia, unspecified organism: Secondary | ICD-10-CM | POA: Diagnosis not present

## 2011-09-19 DIAGNOSIS — D72829 Elevated white blood cell count, unspecified: Secondary | ICD-10-CM

## 2011-09-19 DIAGNOSIS — R1031 Right lower quadrant pain: Secondary | ICD-10-CM | POA: Diagnosis present

## 2011-09-19 DIAGNOSIS — T510X4A Toxic effect of ethanol, undetermined, initial encounter: Secondary | ICD-10-CM | POA: Insufficient documentation

## 2011-09-19 DIAGNOSIS — K5900 Constipation, unspecified: Secondary | ICD-10-CM | POA: Diagnosis present

## 2011-09-19 DIAGNOSIS — H8109 Meniere's disease, unspecified ear: Secondary | ICD-10-CM | POA: Insufficient documentation

## 2011-09-19 DIAGNOSIS — N19 Unspecified kidney failure: Secondary | ICD-10-CM

## 2011-09-19 DIAGNOSIS — R188 Other ascites: Secondary | ICD-10-CM | POA: Diagnosis present

## 2011-09-19 DIAGNOSIS — R109 Unspecified abdominal pain: Secondary | ICD-10-CM

## 2011-09-19 DIAGNOSIS — E860 Dehydration: Secondary | ICD-10-CM | POA: Diagnosis present

## 2011-09-19 DIAGNOSIS — N200 Calculus of kidney: Secondary | ICD-10-CM | POA: Diagnosis present

## 2011-09-19 DIAGNOSIS — R1032 Left lower quadrant pain: Secondary | ICD-10-CM | POA: Diagnosis present

## 2011-09-19 DIAGNOSIS — T43502A Poisoning by unspecified antipsychotics and neuroleptics, intentional self-harm, initial encounter: Secondary | ICD-10-CM | POA: Insufficient documentation

## 2011-09-19 DIAGNOSIS — Z8719 Personal history of other diseases of the digestive system: Secondary | ICD-10-CM

## 2011-09-19 DIAGNOSIS — R509 Fever, unspecified: Secondary | ICD-10-CM | POA: Diagnosis not present

## 2011-09-19 DIAGNOSIS — T438X2A Poisoning by other psychotropic drugs, intentional self-harm, initial encounter: Secondary | ICD-10-CM | POA: Insufficient documentation

## 2011-09-19 DIAGNOSIS — F319 Bipolar disorder, unspecified: Secondary | ICD-10-CM | POA: Diagnosis present

## 2011-09-19 DIAGNOSIS — K219 Gastro-esophageal reflux disease without esophagitis: Secondary | ICD-10-CM | POA: Diagnosis present

## 2011-09-19 HISTORY — DX: Other psychoactive substance abuse, uncomplicated: F19.10

## 2011-09-19 LAB — CARDIAC PANEL(CRET KIN+CKTOT+MB+TROPI)
CK, MB: 1.7 ng/mL (ref 0.3–4.0)
Relative Index: INVALID (ref 0.0–2.5)
Total CK: 82 U/L (ref 7–232)

## 2011-09-19 LAB — URINALYSIS, ROUTINE W REFLEX MICROSCOPIC
Glucose, UA: 100 mg/dL — AB
Ketones, ur: 15 mg/dL — AB
Nitrite: POSITIVE — AB
Protein, ur: >300 mg/dL — AB
Urobilinogen, UA: 1 mg/dL (ref 0.0–1.0)

## 2011-09-19 LAB — DIFFERENTIAL
Basophils Relative: 0 % (ref 0–1)
Eosinophils Absolute: 0.1 10*3/uL (ref 0.0–0.7)
Eosinophils Relative: 1 % (ref 0–5)
Eosinophils Relative: 1 % (ref 0–5)
Lymphocytes Relative: 5 % — ABNORMAL LOW (ref 12–46)
Lymphs Abs: 0.7 10*3/uL (ref 0.7–4.0)
Monocytes Absolute: 0.7 10*3/uL (ref 0.1–1.0)
Monocytes Relative: 5 % (ref 3–12)

## 2011-09-19 LAB — COMPREHENSIVE METABOLIC PANEL
ALT: 15 U/L (ref 0–53)
Alkaline Phosphatase: 65 U/L (ref 39–117)
BUN: 11 mg/dL (ref 6–23)
CO2: 27 mEq/L (ref 19–32)
CO2: 26 mEq/L (ref 19–32)
Calcium: 9 mg/dL (ref 8.4–10.5)
Calcium: 9.7 mg/dL (ref 8.4–10.5)
Creatinine, Ser: 0.98 mg/dL (ref 0.50–1.35)
GFR calc Af Amer: >90 mL/min (ref >90)
GFR calc Af Amer: 31 mL/min — ABNORMAL LOW (ref >90)
GFR calc non Af Amer: >90 mL/min (ref >90)
GFR calc non Af Amer: 27 mL/min — ABNORMAL LOW (ref >90)
Glucose, Bld: 98 mg/dL (ref 70–99)
Glucose, Bld: 99 mg/dL (ref 70–99)
Potassium: 4.2 mEq/L (ref 3.5–5.1)
Sodium: 138 mEq/L (ref 135–145)
Total Bilirubin: 0.6 mg/dL (ref 0.3–1.2)

## 2011-09-19 LAB — BASIC METABOLIC PANEL
BUN: 25 mg/dL — ABNORMAL HIGH (ref 6–23)
Chloride: 104 mEq/L (ref 96–112)
Glucose, Bld: 96 mg/dL (ref 70–99)
Potassium: 4.1 mEq/L (ref 3.5–5.1)

## 2011-09-19 LAB — URINE MICROSCOPIC-ADD ON

## 2011-09-19 LAB — SALICYLATE LEVEL: Salicylate Lvl: <2 mg/dL — ABNORMAL LOW (ref 2.8–20.0)

## 2011-09-19 LAB — ETHANOL
Alcohol, Ethyl (B): <11 mg/dL (ref 0–11)
Alcohol, Ethyl (B): <11 mg/dL (ref 0–11)

## 2011-09-19 LAB — CBC
HCT: 43.4 % (ref 39.0–52.0)
HCT: 38.7 % — ABNORMAL LOW (ref 39.0–52.0)
Hemoglobin: 12.8 g/dL — ABNORMAL LOW (ref 13.0–17.0)
Hemoglobin: 13.1 g/dL (ref 13.0–17.0)
MCH: 30.8 pg (ref 26.0–34.0)
MCH: 30 pg (ref 26.0–34.0)
MCHC: 33.9 g/dL (ref 30.0–36.0)
MCV: 89.6 fL (ref 78.0–100.0)
MCV: 89.5 fL (ref 78.0–100.0)
MCV: 88.6 fL (ref 78.0–100.0)
Platelets: 244 10*3/uL (ref 150–400)
RBC: 4.15 MIL/uL — ABNORMAL LOW (ref 4.22–5.81)
RBC: 4.85 MIL/uL (ref 4.22–5.81)
WBC: 14.1 10*3/uL — ABNORMAL HIGH (ref 4.0–10.5)

## 2011-09-19 LAB — RAPID URINE DRUG SCREEN, HOSP PERFORMED: Amphetamines: NOT DETECTED

## 2011-09-19 LAB — ACETAMINOPHEN LEVEL
Acetaminophen (Tylenol), Serum: <15 ug/mL (ref 10–30)
Acetaminophen (Tylenol), Serum: <15 ug/mL (ref 10–30)

## 2011-09-19 LAB — LACTIC ACID, PLASMA: Lactic Acid, Venous: 1 mmol/L (ref 0.5–2.2)

## 2011-09-19 LAB — URINE CULTURE: Colony Count: NO GROWTH

## 2011-09-19 MED ORDER — HYDROMORPHONE HCL PF 1 MG/ML IJ SOLN
2.0000 mg | INTRAMUSCULAR | Status: DC | PRN
  Administered 2011-09-19 – 2011-09-21 (×12): 2 mg via INTRAVENOUS
  Filled 2011-09-19 (×12): qty 2

## 2011-09-19 MED ORDER — CLONAZEPAM 1 MG PO TABS
1.0000 mg | ORAL_TABLET | Freq: Three times a day (TID) | ORAL | Status: DC
  Administered 2011-09-19 – 2011-10-07 (×54): 1 mg via ORAL
  Filled 2011-09-19 (×54): qty 1

## 2011-09-19 MED ORDER — ONDANSETRON HCL 4 MG/2ML IJ SOLN
4.0000 mg | Freq: Once | INTRAMUSCULAR | Status: AC
  Administered 2011-09-19: 4 mg via INTRAVENOUS
  Filled 2011-09-19: qty 2

## 2011-09-19 MED ORDER — DOCUSATE SODIUM 100 MG PO CAPS
100.0000 mg | ORAL_CAPSULE | Freq: Two times a day (BID) | ORAL | Status: DC
  Administered 2011-09-19 – 2011-10-06 (×34): 100 mg via ORAL
  Filled 2011-09-19 (×37): qty 1

## 2011-09-19 MED ORDER — ALUM & MAG HYDROXIDE-SIMETH 200-200-20 MG/5ML PO SUSP
30.0000 mL | Freq: Four times a day (QID) | ORAL | Status: DC | PRN
  Administered 2011-10-01 – 2011-10-02 (×2): 30 mL via ORAL
  Filled 2011-09-19 (×3): qty 30

## 2011-09-19 MED ORDER — ACETAMINOPHEN 650 MG RE SUPP: 650.0000 mg | Freq: Four times a day (QID) | RECTAL | Status: DC | PRN

## 2011-09-19 MED ORDER — ACETAMINOPHEN 325 MG PO TABS
650.0000 mg | ORAL_TABLET | Freq: Four times a day (QID) | ORAL | Status: DC | PRN
  Administered 2011-09-25 – 2011-10-01 (×4): 650 mg via ORAL
  Filled 2011-09-19 (×4): qty 2

## 2011-09-19 MED ORDER — SODIUM CHLORIDE 0.9 % IV BOLUS (SEPSIS)
500.0000 mL | Freq: Once | INTRAVENOUS | Status: AC
  Administered 2011-09-19: 500 mL via INTRAVENOUS

## 2011-09-19 MED ORDER — ONDANSETRON HCL 4 MG/2ML IJ SOLN
4.0000 mg | Freq: Four times a day (QID) | INTRAMUSCULAR | Status: DC | PRN
  Administered 2011-09-19 – 2011-10-05 (×4): 4 mg via INTRAVENOUS
  Filled 2011-09-19 (×3): qty 2

## 2011-09-19 MED ORDER — HYDROMORPHONE HCL PF 2 MG/ML IJ SOLN
INTRAMUSCULAR | Status: AC
  Administered 2011-09-19: 2 mg via INTRAVENOUS
  Filled 2011-09-19: qty 1

## 2011-09-19 MED ORDER — ONDANSETRON HCL 4 MG/2ML IJ SOLN
INTRAMUSCULAR | Status: AC
  Filled 2011-09-19: qty 2

## 2011-09-19 MED ORDER — SODIUM CHLORIDE 0.9 % IV SOLN: INTRAVENOUS | Status: DC

## 2011-09-19 MED ORDER — SODIUM CHLORIDE 0.9 % IV SOLN
INTRAVENOUS | Status: DC
  Administered 2011-09-19 – 2011-09-25 (×10): via INTRAVENOUS

## 2011-09-19 MED ORDER — ONDANSETRON HCL 4 MG/2ML IJ SOLN
4.0000 mg | Freq: Four times a day (QID) | INTRAMUSCULAR | Status: DC | PRN
  Administered 2011-09-26 – 2011-10-03 (×4): 4 mg via INTRAVENOUS
  Filled 2011-09-19 (×4): qty 2

## 2011-09-19 MED ORDER — CITALOPRAM HYDROBROMIDE 20 MG PO TABS
30.0000 mg | ORAL_TABLET | Freq: Every day | ORAL | Status: DC
  Administered 2011-09-20 – 2011-10-07 (×18): 30 mg via ORAL
  Filled 2011-09-19 (×19): qty 1

## 2011-09-19 MED ORDER — SENNOSIDES-DOCUSATE SODIUM 8.6-50 MG PO TABS
1.0000 | ORAL_TABLET | Freq: Every evening | ORAL | Status: DC | PRN
  Administered 2011-09-25: 1 via ORAL
  Filled 2011-09-19 (×2): qty 1

## 2011-09-19 MED ORDER — MORPHINE SULFATE 2 MG/ML IJ SOLN
2.0000 mg | INTRAMUSCULAR | Status: DC | PRN
  Administered 2011-09-19 – 2011-09-25 (×20): 2 mg via INTRAVENOUS
  Filled 2011-09-19 (×21): qty 1

## 2011-09-19 MED ORDER — ONDANSETRON HCL 4 MG PO TABS
4.0000 mg | ORAL_TABLET | Freq: Four times a day (QID) | ORAL | Status: DC | PRN
  Administered 2011-09-30: 4 mg via ORAL
  Filled 2011-09-19: qty 1

## 2011-09-19 MED ORDER — HYDROMORPHONE HCL PF 1 MG/ML IJ SOLN
1.0000 mg | Freq: Once | INTRAMUSCULAR | Status: AC
  Administered 2011-09-19: 1 mg via INTRAVENOUS
  Filled 2011-09-19: qty 1

## 2011-09-19 MED ORDER — MORPHINE SULFATE 4 MG/ML IJ SOLN
4.0000 mg | Freq: Once | INTRAMUSCULAR | Status: AC
  Administered 2011-09-19: 4 mg via INTRAVENOUS
  Filled 2011-09-19: qty 1

## 2011-09-19 MED ORDER — OXYCODONE HCL 5 MG PO TABS
5.0000 mg | ORAL_TABLET | ORAL | Status: DC | PRN
  Administered 2011-09-20 – 2011-09-25 (×19): 5 mg via ORAL
  Filled 2011-09-19 (×20): qty 1

## 2011-09-19 MED ORDER — SODIUM CHLORIDE 0.9 % IV BOLUS (SEPSIS)
1000.0000 mL | Freq: Once | INTRAVENOUS | Status: AC
  Administered 2011-09-19: 1000 mL via INTRAVENOUS

## 2011-09-19 NOTE — ED Notes (Signed)
Pt. Medicated with 2mg . Dilaudid for lower abdominal pain.

## 2011-09-19 NOTE — ED Notes (Signed)
Pt discharged and sent back to [facility] with police escort.

## 2011-09-19 NOTE — ED Provider Notes (Signed)
History     CSN: [id number]  Arrival date & time 09/19/11  0219   First MD Initiated Contact with Patient 09/19/11 [phone]      Chief Complaint  Patient presents with  . Medical Clearance  . Drug Overdose  . Addiction Problem    HPI: Patient is a 41 y.o. male presenting with mental health disorder. The history is provided by the patient.  Mental Health Problem The primary symptoms include hallucinations. The current episode started this week.  The onset of the illness is precipitated by drug abuse and alcohol abuse. The degree of incapacity that he is experiencing as a consequence of his illness is mild. Sequelae of the illness include homelessness. He admits to suicidal ideas. He contemplates harming himself. He has already injured self. He does not contemplate injuring another person. He has not already  injured another person. Risk factors that are present for mental illness include substance abuse.  0330: Pt initially very lethargic and uncooperative w/ interview, except to say he took approx > 10 Klonopin through-out the day yesterday in an attempt to kill himself. Admits mixing Klonopin w/ ETOH and "pot". Has been sent from [facility] under IVC for medical clearance. 0500: Pt now more awake. Again admits to taking > 10 Klonopin through-out the day yesterday w/ ETOH and pot. When ask specific questions about his recent stressors and reasons for suicide attempt pt states "I don't want to talk about it". Does state he is homeless and lives at [facility]. Denies any recent illnesses or c/o's pain.  Past Medical History  Diagnosis Date  . Ulcerative colitis   . Meniere disease   . Bipolar 1 disorder, depressed   . Substance abuse     History reviewed. No pertinent past surgical history.  No family history on file.  History  Substance Use Topics  . Smoking status: Current Everyday Smoker    Last Attempt to Quit: 08/14/2001  . Smokeless tobacco: Never Used  . Alcohol Use:  No     x 2 days ago, last use      Review of Systems  Constitutional: Negative.   HENT: Negative.   Eyes: Negative.   Respiratory: Negative.   Cardiovascular: Negative.   Gastrointestinal: Negative.   Genitourinary: Negative.   Musculoskeletal: Negative.   Skin: Negative.   Neurological: Negative.   Hematological: Negative.   Psychiatric/Behavioral: Positive for hallucinations.    Allergies  Penicillins  Home Medications   Current Outpatient Rx  Name Route Sig Dispense Refill  . CITALOPRAM HYDROBROMIDE 10 MG PO TABS Oral Take 3 tablets (30 mg total) by mouth daily. 90 tablet 0  . CLONAZEPAM 1 MG PO TABS Oral Take 1 tablet (1 mg total) by mouth 3 (three) times daily. 30 tablet 0    BP 107/70  Pulse 81  Resp 22  SpO2 96%  Physical Exam  Constitutional: He is oriented to person, place, and time. Vital signs are normal. He appears well-developed and well-nourished.  HENT:  Head: Normocephalic and atraumatic.  Eyes: Conjunctivae and EOM are normal. Pupils are equal, round, and reactive to light.  Neck: Neck supple.  Cardiovascular: Normal rate and regular rhythm.   Pulmonary/Chest: Effort normal and breath sounds normal.  Abdominal: Soft. Bowel sounds are normal.  Musculoskeletal: Normal range of motion.  Neurological: He is alert and oriented to person, place, and time.  Skin: Skin is warm and dry.  Psychiatric: He has a normal mood and affect.    ED Course  Procedures   Date: 09/19/2011  Rate: 68  Rhythm: normal sinus rhythm  QRS Axis: left  Intervals: normal  ST/T Wave abnormalities: normal  Conduction Disutrbances:none  Narrative Interpretation: RSR in V1 or V2, probably normal variant  Old EKG Reviewed: none available  0530:  Pt now more awake. Admits his suicide attempt but declines to elaborate. Denies HI.  UDS  positive for opiates, benzo's cocaine and cannabis. Remaining labs w/o acute findings. Pt has been sitting up, has tolerated a meal and PO  fluids w/o difficulty. Will medically clear and discharge back to [facility] for further eval and likely placement for suicide attempt.   Labs Reviewed  CBC - Abnormal; Notable for the following:    RBC 4.15 (*)    Hemoglobin 12.8 (*)    HCT 37.2 (*)    All other components within normal limits  COMPREHENSIVE METABOLIC PANEL - Abnormal; Notable for the following:    Potassium 3.4 (*)    All other components within normal limits  SALICYLATE LEVEL - Abnormal; Notable for the following:    Salicylate Lvl <2.0 (*)    All other components within normal limits  URINE RAPID DRUG SCREEN (HOSP PERFORMED) - Abnormal; Notable for the following:    Opiates POSITIVE (*)    Cocaine POSITIVE (*)    Benzodiazepines POSITIVE (*)    Tetrahydrocannabinol POSITIVE (*)    All other components within normal limits  ETHANOL  ACETAMINOPHEN LEVEL   No results found.   No diagnosis found.    MDM  HPI/PE and clinical findings s/p suicide attempt support medical clearance. UDS positive for mx substances, remaining labs w/o acute findings. VS have remained stable, pt tolerating food and PO fluids prior to d/c. D/c'd back to [facility] for further eval and likley placement.        Roma Kayser Latoia Eyster, NP 09/19/11 [phone]

## 2011-09-19 NOTE — H&P (Signed)
Ian Ruiz MRN: [id number] DOB/AGE: Jul 07, 1971 41 y.o. Primary Care Physician:No primary provider on file. Admit date: 09/19/2011 Chief Complaint: Abdominal pain HPI:  Ian Ruiz is a 41 year old Caucasian gentleman with past medical history of bipolar 1 disorder depressed and and a history of polysubstance abuse who was discharged from the emergency room to [facility] for suicidal ideation and drug abuse. On arrival in [place] patient complaining of lower abdominal pain and subsequently sent back to the ED. In the ED patient was evaluated and labs were done patient was noted to have a creatinine of 2.76 which was an increase from 0.98 on the day before labs in the ED. CBC which was done was also noted to have a white count of 14.1 which was 7.8 the day before per ED records. Lactic acid level which was done was 1.0. CT of the abdomen and pelvis was done and did show some moderate ascites, will call to admit the patient for further evaluation and management. Patient denied any fevers, no chills, no chest pain, no shortness of breath, no diarrhea, no constipation. Patient does endorse some nausea. Patient describes his low abdominal pain as stabbing and shooting in nature and constant. Patient said it felt as if his bladder was hurting the patient stated that he could not pee. Patient also does endorse suicidal ideation.  Past Medical History  Diagnosis Date  . Ulcerative colitis   . Meniere disease   . Bipolar 1 disorder, depressed   . Substance abuse     History reviewed. No pertinent past surgical history.  Prior to Admission medications   Medication Sig Start Date End Date Taking? Authorizing Provider  citalopram (CELEXA) 10 MG tablet Take 3 tablets (30 mg total) by mouth daily. 08/19/11 08/18/12  Orson Aloe, MD  clonazePAM (KLONOPIN) 1 MG tablet Take 1 tablet (1 mg total) by mouth 3 (three) times daily. 08/19/11 10/19/11  Orson Aloe, MD    Allergies:  Allergies  Allergen  Reactions  . Penicillins Rash    History reviewed. No pertinent family history.  Social History:  reports that he has been smoking.  He has never used smokeless tobacco. He reports that he uses illicit drugs (Marijuana and Cocaine). He reports that he does not drink alcohol.  ROS: All systems reviewed with the patient and was positive as per HPI otherwise all other systems are negative.  PHYSICAL EXAM: Blood pressure 139/90, pulse 77, temperature 97.8 F (36.6 C), temperature source Oral, resp. rate 16, height 5\' 10"  (1.778 m), weight 79.379 kg (175 lb), SpO2 96.00%. General: Well developed well nourished in no acute cardiopulmonary distress.  HEENT: Normocephalic atraumatic. Pupils equal round and reactive to light and accommodation. Extraocular movements intact. Oropharynx is clear, no lesions, no exudates. Neck is supple no lymphadenopathy. Dry mucous membranes. No bruits, no goiter. Heart: Regular rate and rhythm, without murmurs, rubs, gallops. Lungs: Clear to auscultation bilaterally. Abdomen: Soft, diffusely tender to palpation greater in the lower quadrants, nondistended, positive bowel sounds. Extremities: No clubbing cyanosis or edema with positive pedal pulses. Neuro: Alert and oriented x3 cranial nerves II through XII are grossly intact no focal deficits.    EKG: Normal sinus rhythm. T wave inversion in leads 3. No other ST-T wave abnormality.  No results found for this or any previous visit (from the past 240 hour(s)).   Lab results:  [facility] 09/19/11 1150 09/19/11 0404  NA 138 136  K 4.2 3.4*  CL 99 100  CO2 26 27  GLUCOSE 99 98  BUN 23 11  CREATININE 2.76* 0.98  CALCIUM 9.7 9.0  MG -- --  PHOS -- --    Basename 09/19/11 1150 09/19/11 0404  AST 15 10  ALT 15 11  ALKPHOS 65 58  BILITOT 0.6 0.4  PROT 7.9 6.9  ALBUMIN 4.0 3.6    Basename 09/19/11 1150  LIPASE 27  AMYLASE --    Basename 09/19/11 1150 09/19/11 0404  WBC 14.1* 7.8  NEUTROABS 12.5* --    HGB 14.3 12.8*  HCT 43.4 37.2*  MCV 89.5 89.6  PLT 244 273    Basename 09/19/11 1150  CKTOTAL 82  CKMB 1.7  CKMBINDEX --  TROPONINI <0.30   No components found with this basename: POCBNP:3 No results found for this basename: DDIMER in the last 72 hours No results found for this basename: HGBA1C:2 in the last 72 hours No results found for this basename: CHOL:2,HDL:2,LDLCALC:2,TRIG:2,CHOLHDL:2,LDLDIRECT:2 in the last 72 hours No results found for this basename: TSH,T4TOTAL,FREET3,T3FREE,THYROIDAB in the last 72 hours No results found for this basename: VITAMINB12:2,FOLATE:2,FERRITIN:2,TIBC:2,IRON:2,RETICCTPCT:2 in the last 72 hours Imaging results:  Ct Abdomen Pelvis Wo Contrast  09/19/2011  *RADIOLOGY REPORT*  Clinical Data: Lower abdominal and testicular pain.  Dysuria. History of kidney stones.  History of ulcerative colitis.  CT ABDOMEN AND PELVIS WITHOUT CONTRAST  Technique:  Multidetector CT imaging of the abdomen and pelvis was performed following the standard protocol without intravenous contrast.  Comparison: 08/01/2011.  Findings: Since the prior examination, the patient has developed a moderate amount of low density ascites.  The etiology of ascites is indeterminate.  This is not centered around the pancreas to suggest pancreatitis.  Fluid extends into the small bowel mesentery and surrounds the jejunal loops.  It is possible this is related to an enteritis or colitis.  The colon and small bowel do not appear thickened.  Barium is noted entering the appendix which does not appear to be a primary source of the ascites.  Sigmoid colon remains under filled. This may be fortuitous although stricture not excluded.  Unenhanced imaging without focal liver, splenic, pancreatic, adrenal or left renal lesion.  Nonobstructing 4 mm right renal calculus.  No calcified gallstones.  No free intraperitoneal air.  Prostate gland top normal in size.  Unenhanced imaging of the urinary bladder  unremarkable.  Scattered normal to top normal size upper abdominal and retroperitoneal lymph nodes.  Small calcification lower abdominal aorta without evidence of aneurysmal dilation.  No bony destructive lesion.  Peripheral atelectasis lung bases greater on the left.  Granuloma right lung base.  IMPRESSION: Interval development of moderate ascites.  Etiology indeterminate. Please see above discussion.  Original Report Authenticated By: Fuller Canada, M.D.   Dg Abd Acute W/chest  09/08/2011  *RADIOLOGY REPORT*  Clinical Data: Right lower quadrant pain. Ulcerative colitis.  ACUTE ABDOMEN SERIES (ABDOMEN 2 VIEW & CHEST 1 VIEW)  Comparison: None.  Findings: The bowel gas pattern is within normal limits.  No evidence of air-fluid levels or free air.  Heart size is normal.  Mild central peribronchial thickening is noted, however there is no evidence of pulmonary infiltrate or pleural effusion.  Calcified granuloma seen in the lateral right lung base, as shown on recent abdomen CT images on 08/01/2011.  IMPRESSION: No acute findings or other significant radiographic abnormality.  Original Report Authenticated By: Danae Orleans, M.D.   Impression/Plan:  Principal Problem:  *Abdominal pain, acute, bilateral lower quadrant Active Problems:  ARF (acute renal failure)  Leukocytosis  Suicidal ideation  Bipolar disorder (manic depression)  Ascites  Polysubstance abuse   #1 abdominal pain/ascites Unknown etiology. LFTs are within normal limits. CT scan showing moderate amount of ascites. Lipase levels within normal limits. Lactic acid levels within normal limits. CBC does have a leukocytosis of 14.1. Patient on exam does not have any rebound or guarding and no signs of acute abdomen. Will admit the patient. Hydrate with IV fluids. Pain management. Antiemetics. We'll get a diagnostic and therapeutic paracentesis. Follow. #2 acute renal failure Likely secondary to a prerenal azotemia secondary to dehydration.  Patient has not had a CT scan done with contrast over the past 24-48 hours. I do not notice any use of any nephrotoxins. Will check a UA with cultures and sensitivities. Will check a urine sodium and a urine creatinine. Will check a renal ultrasound. Place a Foley catheter. IV fluids. On follow renal function. Repeat BMET. #3 leukocytosis Unknown etiology. White count was essentially normal the day before. We'll repeat CBC. Will check it UA with cultures and sensitivity. Will check a chest x-ray. Will check blood cultures x2. Will follow. #4 polysubstance abuse Social work consult. #5 bipolar disorder manic depression/suicidal ideation We'll consult with psychiatry for further evaluation and management. Continue home regimen of Celexa. Suicide precautions. Said at bedside. #6 prophylaxis SCDs for DVT prophylaxis.   Tumeka Chimenti 09/19/2011, 6:40 PM

## 2011-09-19 NOTE — ED Provider Notes (Signed)
Medical screening examination/treatment/procedure(s) were performed by non-physician practitioner and as supervising physician I was immediately available for consultation/collaboration.   Mackinzee Roszak A Akyah Lagrange, MD 09/19/11 2311 

## 2011-09-19 NOTE — ED Notes (Signed)
Pt brought by ems from [facility], pt was discharged from [facility] ED this am. Pt c/o abd pain upon arrival to [facility], was sent back, pt is to be released to go to [place] on discharge today.

## 2011-09-19 NOTE — ED Notes (Signed)
Pt presents with [employer]- IVC paper- sent to [facility]- papers in file.  Pt reports trying to OD- klonopin 1mg -- Pt reports having pills filled on 09/15/2011 75pills- taking large amounts over the past few days. Abuses pain pills and smoked crack cocaine- admits to SI denies HI-

## 2011-09-19 NOTE — ED Notes (Signed)
Patient continues to report lower abdominal pain after receiving 2 mg. Morphine.  Refuses urinary cath until he gets more pain medicine.

## 2011-09-19 NOTE — ED Notes (Signed)
Pt inventory: hat, 1 pair of athletic shoes, 1 pair of socks, 1 black shirt, 1 pair of jeans, 1 button-up collard shirt, 1 pair of boxers, 1 sweater, 1 bus pass in back pocket, 1 bottle of Celexa pills

## 2011-09-19 NOTE — ED Notes (Signed)
[employer] called to transport patient back to [facility].  As I was talking to the dispatcher on the phone, the pt started yelling and cussing from the room.  He threw a cup of water across the room.  I called security to come and stand by until [employer] got here.  He was cussing at the staff and security.  He had a breakfast box that he threatened to throw at the staff.  He took his plastic fork and held it tightly and began rubbing it on his neck.  I told him to put the fork down x3 and he then threw it across the room.  Security remained at the bedside with nursing until [employer] arrived.

## 2011-09-19 NOTE — ED Provider Notes (Signed)
History     CSN: [id number]  Arrival date & time 09/19/11  1027   First MD Initiated Contact with Patient 09/19/11 1031      Chief Complaint  Patient presents with  . Abdominal Pain    (Consider location/radiation/quality/duration/timing/severity/associated sxs/prior treatment) HPI Pt discharged this AM to [facility] for SI, drug abuse. On arrival began to c/o abd pain and sent back to ED. Pt gives completely different history stating he went home and used cocaine, marijuana and then stomach began to hurt. According to nurses note, pt to be d/c to police custody once medically cleared. Pt voiced SI stating he had a plan to end his life by overdosing Past Medical History  Diagnosis Date  . Ulcerative colitis   . Meniere disease   . Bipolar 1 disorder, depressed   . Substance abuse     History reviewed. No pertinent past surgical history.  History reviewed. No pertinent family history.  History  Substance Use Topics  . Smoking status: Current Everyday Smoker    Last Attempt to Quit: 08/14/2001  . Smokeless tobacco: Never Used  . Alcohol Use: No     x 2 days ago, last use      Review of Systems  Constitutional: Negative for fever and chills.  HENT: Negative for neck stiffness.   Respiratory: Negative for chest tightness, shortness of breath and wheezing.   Cardiovascular: Negative for chest pain and leg swelling.  Gastrointestinal: Positive for abdominal pain. Negative for nausea, vomiting, diarrhea and constipation.  Genitourinary: Negative for dysuria.  Skin: Negative for pallor, rash and wound.  Neurological: Negative for seizures, weakness and headaches.  Psychiatric/Behavioral: Positive for suicidal ideas.    Allergies  Penicillins  Home Medications   Current Outpatient Rx  Name Route Sig Dispense Refill  . CITALOPRAM HYDROBROMIDE 10 MG PO TABS Oral Take 3 tablets (30 mg total) by mouth daily. 90 tablet 0  . CLONAZEPAM 1 MG PO TABS Oral Take 1 tablet (1 mg  total) by mouth 3 (three) times daily. 30 tablet 0    BP 128/88  Pulse 62  Temp(Src) 97.5 F (36.4 C) (Oral)  Resp 16  Ht 5\' 10"  (1.778 m)  Wt 175 lb (79.379 kg)  BMI 25.11 kg/m2  SpO2 99%  Physical Exam  ED Course  Procedures (including critical care time)  Labs Reviewed  CBC - Abnormal; Notable for the following:    WBC 14.1 (*)    All other components within normal limits  DIFFERENTIAL - Abnormal; Notable for the following:    Neutrophils Relative 89 (*)    Neutro Abs 12.5 (*)    Lymphocytes Relative 5 (*)    All other components within normal limits  COMPREHENSIVE METABOLIC PANEL - Abnormal; Notable for the following:    Creatinine, Ser 2.76 (*)    GFR calc non Af Amer 27 (*)    GFR calc Af Amer 31 (*)    All other components within normal limits  SALICYLATE LEVEL - Abnormal; Notable for the following:    Salicylate Lvl 0.0 (*)    All other components within normal limits  LIPASE, BLOOD  ACETAMINOPHEN LEVEL  ETHANOL  CARDIAC PANEL(CRET KIN+CKTOT+MB+TROPI)  LACTIC ACID, PLASMA  DRUGS OF ABUSE SCREEN W ALC, ROUTINE URINE   Ct Abdomen Pelvis Wo Contrast  09/19/2011  *RADIOLOGY REPORT*  Clinical Data: Lower abdominal and testicular pain.  Dysuria. History of kidney stones.  History of ulcerative colitis.  CT ABDOMEN AND PELVIS WITHOUT CONTRAST  Technique:  Multidetector CT imaging of the abdomen and pelvis was performed following the standard protocol without intravenous contrast.  Comparison: 08/01/2011.  Findings: Since the prior examination, the patient has developed a moderate amount of low density ascites.  The etiology of ascites is indeterminate.  This is not centered around the pancreas to suggest pancreatitis.  Fluid extends into the small bowel mesentery and surrounds the jejunal loops.  It is possible this is related to an enteritis or colitis.  The colon and small bowel do not appear thickened.  Barium is noted entering the appendix which does not appear to be a  primary source of the ascites.  Sigmoid colon remains under filled. This may be fortuitous although stricture not excluded.  Unenhanced imaging without focal liver, splenic, pancreatic, adrenal or left renal lesion.  Nonobstructing 4 mm right renal calculus.  No calcified gallstones.  No free intraperitoneal air.  Prostate gland top normal in size.  Unenhanced imaging of the urinary bladder unremarkable.  Scattered normal to top normal size upper abdominal and retroperitoneal lymph nodes.  Small calcification lower abdominal aorta without evidence of aneurysmal dilation.  No bony destructive lesion.  Peripheral atelectasis lung bases greater on the left.  Granuloma right lung base.  IMPRESSION: Interval development of moderate ascites.  Etiology indeterminate. Please see above discussion.  Original Report Authenticated By: Fuller Canada, M.D.     1. Abdominal  pain, other specified site   2. Ascites   3. Renal failure   4. Leukocytosis      Date: 09/19/2011  Rate: 68  Rhythm: normal sinus rhythm  QRS Axis: normal  Intervals: normal  ST/T Wave abnormalities: nonspecific ST changes  Conduction Disutrbances:none  Narrative Interpretation:   Old EKG Reviewed: changes noted and New T wave inversion noted in III    MDM   [facility] to admit.       Loren Racer, MD 09/19/11 [phone]

## 2011-09-19 NOTE — ED Notes (Signed)
Called Tammy in phlebotomy. Made her aware of multiple sticks to pt and able to obtain blood. Tammy states it could be til after 930pm before she can access second blood culture and TSH.

## 2011-09-19 NOTE — ED Notes (Signed)
Pt. In blue scrubs and red socks. 1 bag of belongings located behind nurses station. Bag has already been wanded by security.

## 2011-09-19 NOTE — ED Notes (Signed)
Pt asleep, [facility] security guard at bedside

## 2011-09-19 NOTE — ED Notes (Signed)
I called Mary at [place] to let her know that the patient was on the way back to their facility.  I informed her that the patient was being verbally abusive and cussing, and that he remained suicidal.  IVC papers went with the police officers to transport patient.

## 2011-09-19 NOTE — ED Notes (Signed)
Mary from [place] called to verify that they did receive the faxed information.  Report was given to [facility].  She will call back with the transportation plan.

## 2011-09-19 NOTE — ED Notes (Signed)
Pt has security at [facility]. md made aware of pt's desire for pain meds. And pt's request to speak with the md.

## 2011-09-19 NOTE — ED Notes (Signed)
ZOX:[id number]<AV> Expected date:09/19/11<BR> Expected time:10:19 AM<BR> Means of arrival:Ambulance<BR> Comments:<BR> M31. Coming from [place]. Stomach discomfort after taking Klonopin. Seen at your facility.

## 2011-09-19 NOTE — ED Notes (Signed)
Medical clearance papers sent to [facility] again----[phone]---this number was verified with [facility] before faxing the information.  I will clarify that they receive the information.

## 2011-09-19 NOTE — ED Notes (Signed)
[employer] is here to transport the patient to [facility].  He was verbally abusive and cussing when they were here.  They escorted him to the police cruiser with security.

## 2011-09-19 NOTE — ED Notes (Signed)
Police officers have the patient's belongings bag.

## 2011-09-19 NOTE — ED Notes (Signed)
Pt stood and attempted to give urine sample. However, pt states he is in too much pain at this time to give sample

## 2011-09-20 ENCOUNTER — Inpatient Hospital Stay (HOSPITAL_COMMUNITY): Payer: Self-pay

## 2011-09-20 DIAGNOSIS — R45851 Suicidal ideations: Secondary | ICD-10-CM

## 2011-09-20 DIAGNOSIS — N390 Urinary tract infection, site not specified: Secondary | ICD-10-CM | POA: Diagnosis present

## 2011-09-20 DIAGNOSIS — F3130 Bipolar disorder, current episode depressed, mild or moderate severity, unspecified: Secondary | ICD-10-CM

## 2011-09-20 LAB — CBC
Hemoglobin: 11.3 g/dL — ABNORMAL LOW (ref 13.0–17.0)
MCH: 30 pg (ref 26.0–34.0)
MCHC: 32.8 g/dL (ref 30.0–36.0)

## 2011-09-20 LAB — CREATININE, URINE, RANDOM: Creatinine, Urine: 21.9 mg/dL

## 2011-09-20 LAB — BASIC METABOLIC PANEL
BUN: 18 mg/dL (ref 6–23)
Calcium: 8.2 mg/dL — ABNORMAL LOW (ref 8.4–10.5)
GFR calc non Af Amer: 48 mL/min — ABNORMAL LOW (ref >90)
Glucose, Bld: 105 mg/dL — ABNORMAL HIGH (ref 70–99)
Potassium: 3.8 mEq/L (ref 3.5–5.1)

## 2011-09-20 LAB — DIFFERENTIAL
Basophils Absolute: 0 10*3/uL (ref 0.0–0.1)
Basophils Relative: 0 % (ref 0–1)
Eosinophils Absolute: 0.4 10*3/uL (ref 0.0–0.7)
Eosinophils Relative: 3 % (ref 0–5)
Monocytes Absolute: 0.9 10*3/uL (ref 0.1–1.0)

## 2011-09-20 LAB — TSH: TSH: 2.647 u[IU]/mL (ref 0.350–4.500)

## 2011-09-20 MED ORDER — NICOTINE 14 MG/24HR TD PT24
14.0000 mg | MEDICATED_PATCH | Freq: Every day | TRANSDERMAL | Status: DC
  Administered 2011-09-20 – 2011-09-21 (×2): 14 mg via TRANSDERMAL
  Filled 2011-09-20 (×3): qty 1

## 2011-09-20 MED ORDER — LEVOFLOXACIN IN D5W 500 MG/100ML IV SOLN
500.0000 mg | INTRAVENOUS | Status: DC
  Administered 2011-09-20: 500 mg via INTRAVENOUS
  Filled 2011-09-20 (×2): qty 100

## 2011-09-20 NOTE — Progress Notes (Signed)
Subjective: Pt still c/o of lower abdominal pain.No nausea , no emesis.  Objective: Vital signs in last 24 hours: Filed Vitals:   09/19/11 1034 09/19/11 1654 09/19/11 1659 09/19/11 2028  BP:  139/90  147/84  Pulse:  77  57  Temp:   97.8 F (36.6 C) 97.3 F (36.3 C)  TempSrc:    Oral  Resp:    16  Height: 5\' 10"  (1.778 m)   5\' 10"  (1.778 m)  Weight: 79.379 kg (175 lb)   81.1 kg (178 lb 12.7 oz)  SpO2:  96%  97%    Intake/Output Summary (Last 24 hours) at 09/20/11 1910 Last data filed at 09/20/11 1400  Gross per 24 hour  Intake 2762.5 ml  Output   4101 ml  Net -1338.5 ml    Weight change:     Lab Results:  Basename 09/20/11 0440 09/19/11 1907 09/19/11 1900  NA 136 -- 136  K 3.8 -- 4.1  CL 103 -- 104  CO2 26 -- 23  GLUCOSE 105* -- 96  BUN 18 -- 25*  CREATININE 1.69* -- 3.42*  CALCIUM 8.2* -- 8.6  MG -- 1.9 --  PHOS -- -- --    Basename 09/19/11 1150 09/19/11 0404  AST 15 10  ALT 15 11  ALKPHOS 65 58  BILITOT 0.6 0.4  PROT 7.9 6.9  ALBUMIN 4.0 3.6    Basename 09/19/11 1150  LIPASE 27  AMYLASE --    Basename 09/20/11 0440 09/19/11 1900  WBC 10.9* 12.6*  NEUTROABS 7.1 9.8*  HGB 11.3* 13.1  HCT 34.5* 38.7*  MCV 91.5 88.6  PLT 260 292    Basename 09/19/11 1150  CKTOTAL 82  CKMB 1.7  CKMBINDEX --  TROPONINI <0.30   No components found with this basename: POCBNP:3 No results found for this basename: DDIMER:2 in the last 72 hours No results found for this basename: HGBA1C:2 in the last 72 hours No results found for this basename: CHOL:2,HDL:2,LDLCALC:2,TRIG:2,CHOLHDL:2,LDLDIRECT:2 in the last 72 hours  Basename 09/19/11 2048  TSH 2.647  T4TOTAL --  T3FREE --  THYROIDAB --   No results found for this basename: VITAMINB12:2,FOLATE:2,FERRITIN:2,TIBC:2,IRON:2,RETICCTPCT:2 in the last 72 hours  Micro Results: No results found for this or any previous visit (from the past 240 hour(s)).  Studies/Results: Ct Abdomen Pelvis Wo  Contrast  09/19/2011  *RADIOLOGY REPORT*  Clinical Data: Lower abdominal and testicular pain.  Dysuria. History of kidney stones.  History of ulcerative colitis.  CT ABDOMEN AND PELVIS WITHOUT CONTRAST  Technique:  Multidetector CT imaging of the abdomen and pelvis was performed following the standard protocol without intravenous contrast.  Comparison: 08/01/2011.  Findings: Since the prior examination, the patient has developed a moderate amount of low density ascites.  The etiology of ascites is indeterminate.  This is not centered around the pancreas to suggest pancreatitis.  Fluid extends into the small bowel mesentery and surrounds the jejunal loops.  It is possible this is related to an enteritis or colitis.  The colon and small bowel do not appear thickened.  Barium is noted entering the appendix which does not appear to be a primary source of the ascites.  Sigmoid colon remains under filled. This may be fortuitous although stricture not excluded.  Unenhanced imaging without focal liver, splenic, pancreatic, adrenal or left renal lesion.  Nonobstructing 4 mm right renal calculus.  No calcified gallstones.  No free intraperitoneal air.  Prostate gland top normal in size.  Unenhanced imaging of the urinary bladder unremarkable.  Scattered normal to top normal size upper abdominal and retroperitoneal lymph nodes.  Small calcification lower abdominal aorta without evidence of aneurysmal dilation.  No bony destructive lesion.  Peripheral atelectasis lung bases greater on the left.  Granuloma right lung base.  IMPRESSION: Interval development of moderate ascites.  Etiology indeterminate. Please see above discussion.  Original Report Authenticated By: Fuller Canada, M.D.   US Renal  09/19/2011  *RADIOLOGY REPORT*  Clinical Data: Renal failure.  Gross hematuria.  Elevated white blood count.  RENAL/URINARY TRACT ULTRASOUND COMPLETE  Comparison:  CT scan dated 09/19/2011  Findings:  Right Kidney:  Normal.  9.9 cm  in length.  Left Kidney:  Normal.  10.6 cm in length.  Bladder:  Foley catheter in place.  However, the bladder is distended.  Bilateral ureteral jets are visualized.  The tip of a Foley catheter is adjacent to the dome of the bladder.  IMPRESSION:  1.  Normal kidneys. 2.  Distended urinary bladder.  Foley catheter in place with the tip against the superior wall of the bladder.  Original Report Authenticated By: Gwynn Burly, M.D.   US Abdomen Limited  09/20/2011  *RADIOLOGY REPORT*  LIMITED ABDOMINAL ULTRASOUND  The patient had prior CT scan which stated the patient had moderate ascites.  Ultrasound imaging was performed of all four quadrants of the abdomen and image documentation was obtained.  No ascites was noted on ultrasound for needle aspiration.  Impression:  Limited abdominal ultrasound revealing no ascites for paracentesis procedure.  Read by: Anselm Pancoast, P.A.-C  Original Report Authenticated By: Waynard Reeds, M.D.   Portable Chest 1 View  09/19/2011  *RADIOLOGY REPORT*  Clinical Data: Shortness of breath.  PORTABLE CHEST - 1 VIEW  Comparison: None.  Findings: Minimal linear atelectasis is seen in the periphery of the left lung base.  Lungs otherwise clear.  Heart size normal.  No pneumothorax or effusion.  No focal bony abnormality.  IMPRESSION: No acute disease.  Original Report Authenticated By: Bernadene Bell. Maricela Curet, M.D.    Medications:     . citalopram  30 mg Oral Daily  . clonazePAM  1 mg Oral TID  . docusate sodium  100 mg Oral BID  . HYDROmorphone      . levofloxacin (LEVAQUIN) IV  500 mg Intravenous Q24H  . nicotine  14 mg Transdermal Daily  . ondansetron      . sodium chloride  500 mL Intravenous Once    Assessment: Principal Problem:  *Abdominal pain, acute, bilateral lower quadrant Active Problems:  ARF (acute renal failure)  Leukocytosis  Suicidal ideation  Bipolar disorder (manic depression)  Ascites  UTI (lower urinary tract infection)   Polysubstance abuse   Plan: #1 acute bilateral lower quadrant abdominal pain Unknown etiology. May be secondary to urinary tract infection than the probable kidney stone which is nonobstructing per CT scan.[name] UA consistent with UTI. Urine cultures are pending. Continue Foley catheter. Continue hydration with IV fluids, supportive care, pain management. Will place on IV Levaquin.  #2 acute renal failure Likely secondary to prerenal azotemia. Renal ultrasound is negative for hydronephrosis. Urine sodium urine creatinine is pending. Renal function improved with hydration. Follow. #3 UTI Urine cultures pending. Start Levaquin. #4 leukocytosis Likely secondary to problem #3. Will start IV Levaquin. Urine cultures are pending. Blood cultures are pending. #5 ascites Unknown etiology. Abdominal ultrasound was negative for ascites. #6 polysubstance abuse Social work consult #7 bipolar disorder/suicidal ideation Psych consultation pending. Continue Celexa and Klonopin.  Continue one on one sitter. Suicide precautions. Patient will likely need inpatient psychiatric treatment. #8 prophylaxis SCDs for DVT.   LOS: 1 day   THOMPSON,DANIEL 09/20/2011, 7:10 PM

## 2011-09-20 NOTE — Progress Notes (Signed)
   CARE MANAGEMENT NOTE 09/20/2011  Patient:  Ian Ruiz, Ian Ruiz   Account Number:  [id number]  Date Initiated:  09/20/2011  Documentation initiated by:  Lanier Clam  Subjective/Objective Assessment:   ADMITTED W/ABD PAIN.IN ROUTE TO [facility] FOR ADMISSION-SUICIDAL IDEATION,& DRUG ABUSE.WU:[id number] ABUSE.     Action/Plan:   PSYCH/CLINREHAB @ D/C.   Anticipated DC Date:  09/26/2011   Anticipated DC Plan:  [facility]  In-house referral  Clinical Social Worker         Choice offered to / List presented to:             Status of service:  In process, will continue to follow Medicare Important Message given?   (If response is "NO", the following Medicare IM given date fields will be blank) Date Medicare IM given:   Date Additional Medicare IM given:    Discharge Disposition:    Per UR Regulation:  Reviewed for med. necessity/level of care/duration of stay  Comments:  09/20/11 [facility] RN,BSN NCM [phone]

## 2011-09-20 NOTE — Progress Notes (Signed)
Patient has been manipulative today, making advances and saying inappropriate things to male staff and sitter, sitter assignment changed, patient requesting to go outside to smoke, advised of the smoking policy, nicotene patch applied, patient falling asleep during conversation but stating that his pain is 10/10 when this RN walks into the room, urine is now clear yellow in foley

## 2011-09-20 NOTE — Consult Note (Signed)
Reason for Consult: Suicidal ideation. Referring Physician: [name] is an 41 y.o. male.  HPI: Patient was sent from [facility] on 09/15/2011 for admission overdose with 75 pills of Klonopin. He was to be medically cleared and then was returned IVC to [place]. On the 21st he was returned from [place] to [facility] ED with complaint of abdominal pain after taking Klonopin. He was found to be in acute renal failure and scan of the abdomen revealed a ascites. He has a persistent complaint of suicidal ideation with intent to kill himself. He claims he has tried many times to recover from drug abuse and "I cannot". He has been assigned a sitter to monitor any suicide intent. AXIS I  Bipolar disorder, depressed, active suicidal ideation; Polysubstance dependence AXIS II deferred AXIS III Acute Renal Failure. ascites Past Medical History  Diagnosis Date  . Ulcerative colitis   . Meniere disease   . Bipolar 1 disorder, depressed   . Substance abuse   AXIS IV  Housing, family conflict, parenting, finances AXIS V  GAF 40  History reviewed. No pertinent past surgical history.  History reviewed. No pertinent family history.  Social History:  reports that he has been smoking.  He has never used smokeless tobacco. He reports that he uses illicit drugs (Marijuana and Cocaine). He reports that he does not drink alcohol.  Allergies:  Allergies  Allergen Reactions  . Penicillins Rash    Medications: I have reviewed the patient's current medications.  Results for orders placed during the hospital encounter of 09/19/11 (from the past 48 hour(s))  CBC     Status: Abnormal   Collection Time   09/19/11 11:50 AM      Component Value Range Comment   WBC 14.1 (*) 4.0 - 10.5 (K/uL)    RBC 4.85  4.22 - 5.81 (MIL/uL)    Hemoglobin 14.3  13.0 - 17.0 (g/dL)    HCT 16.1  09.6 - 04.5 (%)    MCV 89.5  78.0 - 100.0 (fL)    MCH 29.5  26.0 - 34.0 (pg)    MCHC 32.9  30.0 - 36.0 (g/dL)    RDW 40.9   81.1 - 91.4 (%)    Platelets 244  150 - 400 (K/uL)   DIFFERENTIAL     Status: Abnormal   Collection Time   09/19/11 11:50 AM      Component Value Range Comment   Neutrophils Relative 89 (*) 43 - 77 (%)    Neutro Abs 12.5 (*) 1.7 - 7.7 (K/uL)    Lymphocytes Relative 5 (*) 12 - 46 (%)    Lymphs Abs 0.7  0.7 - 4.0 (K/uL)    Monocytes Relative 6  3 - 12 (%)    Monocytes Absolute 0.8  0.1 - 1.0 (K/uL)    Eosinophils Relative 1  0 - 5 (%)    Eosinophils Absolute 0.1  0.0 - 0.7 (K/uL)    Basophils Relative 0  0 - 1 (%)    Basophils Absolute 0.0  0.0 - 0.1 (K/uL)   COMPREHENSIVE METABOLIC PANEL     Status: Abnormal   Collection Time   09/19/11 11:50 AM      Component Value Range Comment   Sodium 138  135 - 145 (mEq/L)    Potassium 4.2  3.5 - 5.1 (mEq/L)    Chloride 99  96 - 112 (mEq/L)    CO2 26  19 - 32 (mEq/L)    Glucose, Bld 99  70 -  99 (mg/dL)    BUN 23  6 - 23 (mg/dL)    Creatinine, Ser 1.61 (*) 0.50 - 1.35 (mg/dL)    Calcium 9.7  8.4 - 10.5 (mg/dL)    Total Protein 7.9  6.0 - 8.3 (g/dL)    Albumin 4.0  3.5 - 5.2 (g/dL)    AST 15  0 - 37 (U/L)    ALT 15  0 - 53 (U/L)    Alkaline Phosphatase 65  39 - 117 (U/L)    Total Bilirubin 0.6  0.3 - 1.2 (mg/dL)    GFR calc non Af Amer 27 (*) >90 (mL/min)    GFR calc Af Amer 31 (*) >90 (mL/min)   LIPASE, BLOOD     Status: Normal   Collection Time   09/19/11 11:50 AM      Component Value Range Comment   Lipase 27  11 - 59 (U/L)   ACETAMINOPHEN LEVEL     Status: Normal   Collection Time   09/19/11 11:50 AM      Component Value Range Comment   Acetaminophen (Tylenol), Serum <15.0  10 - 30 (ug/mL)   SALICYLATE LEVEL     Status: Abnormal   Collection Time   09/19/11 11:50 AM      Component Value Range Comment   Salicylate Lvl 0.0 (*) 2.8 - 20.0 (mg/dL)   ETHANOL     Status: Normal   Collection Time   09/19/11 11:50 AM      Component Value Range Comment   Alcohol, Ethyl (B) <11  0 - 11 (mg/dL)   CARDIAC PANEL(CRET KIN+CKTOT+MB+TROPI)      Status: Normal   Collection Time   09/19/11 11:50 AM      Component Value Range Comment   Total CK 82  7 - 232 (U/L)    CK, MB 1.7  0.3 - 4.0 (ng/mL)    Troponin I <0.30  <0.30 (ng/mL)    Relative Index RELATIVE INDEX IS INVALID  0.0 - 2.5    LACTIC ACID, PLASMA     Status: Normal   Collection Time   09/19/11  3:25 PM      Component Value Range Comment   Lactic Acid, Venous 1.0  0.5 - 2.2 (mmol/L)   URINALYSIS, ROUTINE W REFLEX MICROSCOPIC     Status: Abnormal   Collection Time   09/19/11  6:36 PM      Component Value Range Comment   Color, Urine RED (*) YELLOW  BIOCHEMICALS MAY BE AFFECTED BY COLOR   APPearance TURBID (*) CLEAR     Specific Gravity, Urine 1.014  1.005 - 1.030     pH 7.5  5.0 - 8.0     Glucose, UA 100 (*) NEGATIVE (mg/dL)    Hgb urine dipstick LARGE (*) NEGATIVE     Bilirubin Urine MODERATE (*) NEGATIVE     Ketones, ur 15 (*) NEGATIVE (mg/dL)    Protein, ur >096 (*) NEGATIVE (mg/dL)    Urobilinogen, UA 1.0  0.0 - 1.0 (mg/dL)    Nitrite POSITIVE (*) NEGATIVE     Leukocytes, UA MODERATE (*) NEGATIVE    CREATININE, URINE, RANDOM     Status: Normal   Collection Time   09/19/11  6:36 PM      Component Value Range Comment   Creatinine, Urine 21.9     URINE MICROSCOPIC-ADD ON     Status: Abnormal   Collection Time   09/19/11  6:36 PM      Component Value  Range Comment   WBC, UA 7-10  <3 (WBC/hpf)    RBC / HPF TOO NUMEROUS TO COUNT  <3 (RBC/hpf)    Bacteria, UA FEW (*) RARE    BASIC METABOLIC PANEL     Status: Abnormal   Collection Time   09/19/11  7:00 PM      Component Value Range Comment   Sodium 136  135 - 145 (mEq/L)    Potassium 4.1  3.5 - 5.1 (mEq/L)    Chloride 104  96 - 112 (mEq/L)    CO2 23  19 - 32 (mEq/L)    Glucose, Bld 96  70 - 99 (mg/dL)    BUN 25 (*) 6 - 23 (mg/dL)    Creatinine, Ser 4.54 (*) 0.50 - 1.35 (mg/dL)    Calcium 8.6  8.4 - 10.5 (mg/dL)    GFR calc non Af Amer 21 (*) >90 (mL/min)    GFR calc Af Amer 24 (*) >90 (mL/min)   CBC      Status: Abnormal   Collection Time   09/19/11  7:00 PM      Component Value Range Comment   WBC 12.6 (*) 4.0 - 10.5 (K/uL)    RBC 4.37  4.22 - 5.81 (MIL/uL)    Hemoglobin 13.1  13.0 - 17.0 (g/dL)    HCT 09.8 (*) 11.9 - 52.0 (%)    MCV 88.6  78.0 - 100.0 (fL)    MCH 30.0  26.0 - 34.0 (pg)    MCHC 33.9  30.0 - 36.0 (g/dL)    RDW 14.7  82.9 - 56.2 (%)    Platelets 292  150 - 400 (K/uL)   DIFFERENTIAL     Status: Abnormal   Collection Time   09/19/11  7:00 PM      Component Value Range Comment   Neutrophils Relative 77  43 - 77 (%)    Neutro Abs 9.8 (*) 1.7 - 7.7 (K/uL)    Lymphocytes Relative 16  12 - 46 (%)    Lymphs Abs 2.0  0.7 - 4.0 (K/uL)    Monocytes Relative 5  3 - 12 (%)    Monocytes Absolute 0.7  0.1 - 1.0 (K/uL)    Eosinophils Relative 1  0 - 5 (%)    Eosinophils Absolute 0.1  0.0 - 0.7 (K/uL)    Basophils Relative 0  0 - 1 (%)    Basophils Absolute 0.0  0.0 - 0.1 (K/uL)   MAGNESIUM     Status: Normal   Collection Time   09/19/11  7:07 PM      Component Value Range Comment   Magnesium 1.9  1.5 - 2.5 (mg/dL)   TSH     Status: Normal   Collection Time   09/19/11  8:48 PM      Component Value Range Comment   TSH 2.647  0.350 - 4.500 (uIU/mL)   BASIC METABOLIC PANEL     Status: Abnormal   Collection Time   09/20/11  4:40 AM      Component Value Range Comment   Sodium 136  135 - 145 (mEq/L)    Potassium 3.8  3.5 - 5.1 (mEq/L)    Chloride 103  96 - 112 (mEq/L)    CO2 26  19 - 32 (mEq/L)    Glucose, Bld 105 (*) 70 - 99 (mg/dL)    BUN 18  6 - 23 (mg/dL)    Creatinine, Ser 1.30 (*) 0.50 - 1.35 (mg/dL)    Calcium  8.2 (*) 8.4 - 10.5 (mg/dL)    GFR calc non Af Amer 48 (*) >90 (mL/min)    GFR calc Af Amer 56 (*) >90 (mL/min)   CBC     Status: Abnormal   Collection Time   09/20/11  4:40 AM      Component Value Range Comment   WBC 10.9 (*) 4.0 - 10.5 (K/uL)    RBC 3.77 (*) 4.22 - 5.81 (MIL/uL)    Hemoglobin 11.3 (*) 13.0 - 17.0 (g/dL)    HCT 91.4 (*) 78.2 - 52.0 (%)     MCV 91.5  78.0 - 100.0 (fL)    MCH 30.0  26.0 - 34.0 (pg)    MCHC 32.8  30.0 - 36.0 (g/dL)    RDW 95.6  21.3 - 08.6 (%)    Platelets 260  150 - 400 (K/uL)   DIFFERENTIAL     Status: Normal   Collection Time   09/20/11  4:40 AM      Component Value Range Comment   Neutrophils Relative 65  43 - 77 (%)    Neutro Abs 7.1  1.7 - 7.7 (K/uL)    Lymphocytes Relative 22  12 - 46 (%)    Lymphs Abs 2.4  0.7 - 4.0 (K/uL)    Monocytes Relative 9  3 - 12 (%)    Monocytes Absolute 0.9  0.1 - 1.0 (K/uL)    Eosinophils Relative 3  0 - 5 (%)    Eosinophils Absolute 0.4  0.0 - 0.7 (K/uL)    Basophils Relative 0  0 - 1 (%)    Basophils Absolute 0.0  0.0 - 0.1 (K/uL)     Ct Abdomen Pelvis Wo Contrast  09/19/2011  *RADIOLOGY REPORT*  Clinical Data: Lower abdominal and testicular pain.  Dysuria. History of kidney stones.  History of ulcerative colitis.  CT ABDOMEN AND PELVIS WITHOUT CONTRAST  Technique:  Multidetector CT imaging of the abdomen and pelvis was performed following the standard protocol without intravenous contrast.  Comparison: 08/01/2011.  Findings: Since the prior examination, the patient has developed a moderate amount of low density ascites.  The etiology of ascites is indeterminate.  This is not centered around the pancreas to suggest pancreatitis.  Fluid extends into the small bowel mesentery and surrounds the jejunal loops.  It is possible this is related to an enteritis or colitis.  The colon and small bowel do not appear thickened.  Barium is noted entering the appendix which does not appear to be a primary source of the ascites.  Sigmoid colon remains under filled. This may be fortuitous although stricture not excluded.  Unenhanced imaging without focal liver, splenic, pancreatic, adrenal or left renal lesion.  Nonobstructing 4 mm right renal calculus.  No calcified gallstones.  No free intraperitoneal air.  Prostate gland top normal in size.  Unenhanced imaging of the urinary bladder  unremarkable.  Scattered normal to top normal size upper abdominal and retroperitoneal lymph nodes.  Small calcification lower abdominal aorta without evidence of aneurysmal dilation.  No bony destructive lesion.  Peripheral atelectasis lung bases greater on the left.  Granuloma right lung base.  IMPRESSION: Interval development of moderate ascites.  Etiology indeterminate. Please see above discussion.  Original Report Authenticated By: Fuller Canada, M.D.   US Renal  09/19/2011  *RADIOLOGY REPORT*  Clinical Data: Renal failure.  Gross hematuria.  Elevated white blood count.  RENAL/URINARY TRACT ULTRASOUND COMPLETE  Comparison:  CT scan dated 09/19/2011  Findings:  Right Kidney:  Normal.  9.9 cm in length.  Left Kidney:  Normal.  10.6 cm in length.  Bladder:  Foley catheter in place.  However, the bladder is distended.  Bilateral ureteral jets are visualized.  The tip of a Foley catheter is adjacent to the dome of the bladder.  IMPRESSION:  1.  Normal kidneys. 2.  Distended urinary bladder.  Foley catheter in place with the tip against the superior wall of the bladder.  Original Report Authenticated By: Gwynn Burly, M.D.   US Abdomen Limited  09/20/2011  *RADIOLOGY REPORT*  LIMITED ABDOMINAL ULTRASOUND  The patient had prior CT scan which stated the patient had moderate ascites.  Ultrasound imaging was performed of all four quadrants of the abdomen and image documentation was obtained.  No ascites was noted on ultrasound for needle aspiration.  Impression:  Limited abdominal ultrasound revealing no ascites for paracentesis procedure.  Read by: Anselm Pancoast, P.A.-C  Original Report Authenticated By: Waynard Reeds, M.D.   Portable Chest 1 View  09/19/2011  *RADIOLOGY REPORT*  Clinical Data: Shortness of breath.  PORTABLE CHEST - 1 VIEW  Comparison: None.  Findings: Minimal linear atelectasis is seen in the periphery of the left lung base.  Lungs otherwise clear.  Heart size normal.  No  pneumothorax or effusion.  No focal bony abnormality.  IMPRESSION: No acute disease.  Original Report Authenticated By: Bernadene Bell. Maricela Curet, M.D.    Review of Systems  Unable to perform ROS: mental acuity   Blood pressure 99/63, pulse 65, temperature 97.3 F (36.3 C), temperature source Oral, resp. rate 16, height 5\' 10"  (1.778 m), weight 81.1 kg (178 lb 12.7 oz), SpO2 94.00%. Physical Exam  Assessment/Plan: Chart is reviewed.  Pt is interviewed and the patient is in bed appears to be sleeping .  He is around his to speak   and he volunteers that he is tired of fighting his addictions. He said he wanted to die and took 30 Klonopin. He admits to having overdosed with Klonopin and Percocet prior to this attempt. At one time earlier he cut his wrist. He states he has been in many rehabilitation programs in the network. He always relapses with alcohol, cocaine crack cocaine and marijuana. He states that he has had many arrests for trespassing physical assault of men and women and larceny. Upon speaking he often drifts off and appears to be falling asleep. He is easily wakened with a request to participate in the interview. He expresses hopelessness and inability to see any other option other than killing himself. He states that he is very depressed and sees no other option. He has 2 children and states that he sees maybe every other month. He dismisses the idea that they may grieve. He volunteers that his father attempted suicide recently and is disappointed that he didn't succeed. He states he hates his father.-Gives no explanation. He does not mention his abdominal pain and has been the cause of his admission. He denies homicidal ideation.  He has no eye contact, is somnolent, and has spontaneous speech to drifts off into mumbling. His her speech is organized. His thought process reveals poor insight and in lack of judgment. He perseverates about wanting to kill himself and end his life.   RECOMMENDATION: 1. When medically clear this patient is to be referred to an appropriate mental health inpatient unit 2. Continue sitter for suicide safety; patient is high risk for suicide attempt. 3. Consider IVC for  Transfer to the  appropriate psychiatric inpatient unit. 4. Consider holding Klonopin until more alert. 5. Consultation this concluded unless further evaluation is requested.  Ian Ruiz 09/20/2011, 7:44 PM

## 2011-09-20 NOTE — Progress Notes (Signed)
PATIENT QUALIFIES FOR INDIGENT FUNDS IF NEEDED.

## 2011-09-20 NOTE — Progress Notes (Signed)
INITIAL ADULT NUTRITION ASSESSMENT Date: 09/20/2011   Time: 10:59 AM Reason for Assessment: Consult, nutrition risk   ASSESSMENT: Male 41 y.o.  Dx: Abdominal pain, acute, bilateral lower quadrant  Hx:  Past Medical History  Diagnosis Date  . Ulcerative colitis   . Meniere disease   . Bipolar 1 disorder, depressed   . Substance abuse    Related Meds:  Scheduled Meds:   . citalopram  30 mg Oral Daily  . clonazePAM  1 mg Oral TID  . docusate sodium  100 mg Oral BID  . HYDROmorphone      .  HYDROmorphone (DILAUDID) injection  1 mg Intravenous Once  .  morphine injection  4 mg Intravenous Once  . ondansetron      . ondansetron (ZOFRAN) IV  4 mg Intravenous Once  . sodium chloride  1,000 mL Intravenous Once  . sodium chloride  1,000 mL Intravenous Once  . sodium chloride  500 mL Intravenous Once   Continuous Infusions:   . sodium chloride 125 mL/hr at 09/19/11 2130  . DISCONTD: sodium chloride     PRN Meds:.acetaminophen, acetaminophen, alum & mag hydroxide-simeth, HYDROmorphone, morphine injection, ondansetron, ondansetron (ZOFRAN) IV, ondansetron, oxyCODONE, senna-docusate  Ht: 5\' 10"  (177.8 cm)  Wt: 178 lb 12.7 oz (81.1 kg) with ascites  Ideal Wt: 75.5kg % Ideal Wt: 107  Usual Wt: 86.3kg % Usual Wt: 94  Body mass index is 25.65 kg/(m^2).  Food/Nutrition Related Hx: Pt reports not eating well PTA r/t nausea and reports 11 pound unintentional weight loss in the past few months. Pt reports improvement in nausea today. Pt denies any difficulty swallowing, states that sometimes food can be hard to swallow but he typically drinks a cold beverage and then the food goes down. CT of abdomen yesterday showed interval development of moderate ascites.   Labs:  CMP     Component Value Date/Time   NA 136 09/20/2011 0440   K 3.8 09/20/2011 0440   CL 103 09/20/2011 0440   CO2 26 09/20/2011 0440   GLUCOSE 105* 09/20/2011 0440   BUN 18 09/20/2011 0440   CREATININE 1.69* 09/20/2011  0440   CALCIUM 8.2* 09/20/2011 0440   PROT 7.9 09/19/2011 1150   ALBUMIN 4.0 09/19/2011 1150   AST 15 09/19/2011 1150   ALT 15 09/19/2011 1150   ALKPHOS 65 09/19/2011 1150   BILITOT 0.6 09/19/2011 1150   GFRNONAA 48* 09/20/2011 0440   GFRAA 56* 09/20/2011 0440    Intake/Output Summary (Last 24 hours) at 09/20/11 1104 Last data filed at 09/20/11 9604  Gross per 24 hour  Intake 2762.5 ml  Output   3100 ml  Net -337.5 ml   Last BM - 09/19/11  Diet Order: General   IVF:    sodium chloride Last Rate: 125 mL/hr at 09/19/11 2130  DISCONTD: sodium chloride     Estimated Nutritional Needs:   Kcal:1800-2100 Protein:80-100g Fluid:1.8-2.1L  NUTRITION DIAGNOSIS: -Predicted suboptimal energy intake (NI-1.6).  Status: Ongoing -Pt meets criteria for severe PCM of acute illness AEB 5.7% weight loss in the past few months and <75% energy intake r/t nausea per pt statement  RELATED TO: nausea   AS EVIDENCE BY: pt statement  MONITORING/EVALUATION(Goals): Pt to consume >75% of meals.   EDUCATION NEEDS: -No education needs identified at this time  INTERVENTION: Encouraged increased intake as nausea resolves. Pt currently not interested in nutritional supplements. Will monitor.   Dietitian #: [phone]  DOCUMENTATION CODES Per approved criteria  -Severe malnutrition in the  context of acute illness or injury    Marshall Cork 09/20/2011, 10:59 AM

## 2011-09-21 ENCOUNTER — Other Ambulatory Visit (HOSPITAL_COMMUNITY): Payer: Self-pay

## 2011-09-21 LAB — CBC
HCT: 32.7 % — ABNORMAL LOW (ref 39.0–52.0)
Hemoglobin: 10.8 g/dL — ABNORMAL LOW (ref 13.0–17.0)
MCH: 30 pg (ref 26.0–34.0)
MCHC: 33 g/dL (ref 30.0–36.0)
RBC: 3.6 MIL/uL — ABNORMAL LOW (ref 4.22–5.81)

## 2011-09-21 LAB — DIFFERENTIAL
Basophils Relative: 1 % (ref 0–1)
Eosinophils Relative: 4 % (ref 0–5)
Lymphocytes Relative: 29 % (ref 12–46)
Monocytes Absolute: 0.7 10*3/uL (ref 0.1–1.0)
Monocytes Relative: 10 % (ref 3–12)
Neutro Abs: 4.1 10*3/uL (ref 1.7–7.7)

## 2011-09-21 LAB — BASIC METABOLIC PANEL
BUN: 8 mg/dL (ref 6–23)
CO2: 25 mEq/L (ref 19–32)
Calcium: 8.2 mg/dL — ABNORMAL LOW (ref 8.4–10.5)
GFR calc non Af Amer: >90 mL/min (ref >90)
Glucose, Bld: 100 mg/dL — ABNORMAL HIGH (ref 70–99)
Sodium: 134 mEq/L — ABNORMAL LOW (ref 135–145)

## 2011-09-21 MED ORDER — HYDROMORPHONE HCL PF 2 MG/ML IJ SOLN
2.0000 mg | INTRAMUSCULAR | Status: DC | PRN
  Administered 2011-09-21 – 2011-09-22 (×3): 2 mg via INTRAVENOUS
  Filled 2011-09-21 (×3): qty 1

## 2011-09-21 MED ORDER — LEVOFLOXACIN 500 MG PO TABS
500.0000 mg | ORAL_TABLET | Freq: Every day | ORAL | Status: DC
  Administered 2011-09-21 – 2011-09-25 (×5): 500 mg via ORAL
  Filled 2011-09-21 (×6): qty 1

## 2011-09-21 NOTE — Progress Notes (Signed)
Received call from MD, notified of pt's increased pain and result of bladder scan. Ordered to place a foley cath. Pt refusing that at this time.

## 2011-09-21 NOTE — Progress Notes (Signed)
Per MD, Pt not medically ready for d/c.  CSW to continue to follow.  Amanda Tiaira Arambula, LCSWA Clinical Social Work [phone]  

## 2011-09-21 NOTE — Progress Notes (Signed)
Per MD, Pt need IVC paperwork.  Completed IVC paperwork.  Obtained MD's signature.  Alerted Magistrate's Office to faxing IVC paperwork.  Confirmed receipt of IVC documents by Magistrate's Office.  CSW to continue to follow.  Amanda Izeyah Deike, LCSWA Clinical Social Work [phone]  

## 2011-09-21 NOTE — Progress Notes (Signed)
Pt. Continues to moan and groan for pain after pain medications given, bladder scan done with result 999 ml, pt voided 530 ml shortly after. Md paged, awaiting a return call. Will continue to monitor.

## 2011-09-21 NOTE — Progress Notes (Signed)
Pt. Refused to drink contrast for CT, MD notified. Pt states he had CT scan yesterday. MD to re-evaluate orders.

## 2011-09-21 NOTE — Progress Notes (Signed)
Patient ID: Ian Ruiz, male   DOB: 05/01/1971, 41 y.o.   MRN: [id number]  Subjective: No events overnight. Patient denies chest pain, shortness of breath, abdominal pain. Had bowel movement and reports ambulating.  Objective:  Vital signs in last 24 hours:  Filed Vitals:   09/21/11 0455 09/21/11 0745 09/21/11 0900 09/21/11 1355  BP: 107/69 109/72  129/62  Pulse: 65 54  53  Temp: 98.1 F (36.7 C) 98.1 F (36.7 C)  98 F (36.7 C)  TempSrc: Oral Oral  Oral  Resp: 16 12  16   Height:      Weight:   85.8 kg (189 lb 2.5 oz)   SpO2: 91% 93%  95%    Intake/Output from previous day:   Intake/Output Summary (Last 24 hours) at 09/21/11 1654 Last data filed at 09/21/11 1235  Gross per 24 hour  Intake    960 ml  Output   6101 ml  Net  -5141 ml    Physical Exam: General: Alert, awake, oriented x3, in no acute distress. HEENT: No bruits, no goiter. Moist mucous membranes, no scleral icterus, no conjunctival pallor. Heart: Regular rate and rhythm, S1/S2 +, no murmurs, rubs, gallops. Lungs: Clear to auscultation bilaterally. No wheezing, no rhonchi, no rales.  Abdomen: Soft, nontender, nondistended, positive bowel sounds. Extremities: No clubbing or cyanosis, no pitting edema,  positive pedal pulses. Neuro: Grossly nonfocal.  Lab Results:  Basic Metabolic Panel:    Component Value Date/Time   NA 134* 09/21/2011 0452   K 3.8 09/21/2011 0452   CL 104 09/21/2011 0452   CO2 25 09/21/2011 0452   BUN 8 09/21/2011 0452   CREATININE 0.99 09/21/2011 0452   GLUCOSE 100* 09/21/2011 0452   CALCIUM 8.2* 09/21/2011 0452   CBC:    Component Value Date/Time   WBC 7.2 09/21/2011 0452   HGB 10.8* 09/21/2011 0452   HCT 32.7* 09/21/2011 0452   PLT 210 09/21/2011 0452   MCV 90.8 09/21/2011 0452   NEUTROABS 4.1 09/21/2011 0452   LYMPHSABS 2.1 09/21/2011 0452   MONOABS 0.7 09/21/2011 0452   EOSABS 0.3 09/21/2011 0452   BASOSABS 0.0 09/21/2011 0452      Lab 09/21/11 0452 09/20/11 0440 09/19/11  1900 09/19/11 1150 09/19/11 0404  WBC 7.2 10.9* 12.6* 14.1* 7.8  HGB 10.8* 11.3* 13.1 14.3 12.8*  HCT 32.7* 34.5* 38.7* 43.4 37.2*  PLT 210 260 292 244 273  MCV 90.8 91.5 88.6 89.5 89.6  MCH 30.0 30.0 30.0 29.5 30.8  MCHC 33.0 32.8 33.9 32.9 34.4  RDW 13.7 14.1 13.8 13.7 13.8  LYMPHSABS 2.1 2.4 2.0 0.7 --  MONOABS 0.7 0.9 0.7 0.8 --  EOSABS 0.3 0.4 0.1 0.1 --  BASOSABS 0.0 0.0 0.0 0.0 --  BANDABS -- -- -- -- --    Lab 09/21/11 0452 09/20/11 0440 09/19/11 1907 09/19/11 1900 09/19/11 1150 09/19/11 0404  NA 134* 136 -- 136 138 136  K 3.8 3.8 -- 4.1 4.2 3.4*  CL 104 103 -- 104 99 100  CO2 25 26 -- 23 26 27   GLUCOSE 100* 105* -- 96 99 98  BUN 8 18 -- 25* 23 11  CREATININE 0.99 1.69* -- 3.42* 2.76* 0.98  CALCIUM 8.2* 8.2* -- 8.6 9.7 9.0  MG -- -- 1.9 -- -- --   No results found for this basename: INR:5,PROTIME:5 in the last 168 hours Cardiac markers:  Lab 09/19/11 1150  CKMB 1.7  TROPONINI <0.30  MYOGLOBIN --   No components found with this  basename: POCBNP:3 Recent Results (from the past 240 hour(s))  URINE CULTURE     Status: Normal   Collection Time   09/19/11  6:36 PM      Component Value Range Status Comment   Specimen Description URINE, CATHETERIZED   Final    Special Requests NONE   Final    Setup Time [id number]   Final    Colony Count NO GROWTH   Final    Culture NO GROWTH   Final    Report Status 09/20/2011 FINAL   Final   CULTURE, BLOOD (ROUTINE X 2)     Status: Normal (Preliminary result)   Collection Time   09/19/11  7:38 PM      Component Value Range Status Comment   Specimen Description BLOOD LEFT AC   Final    Special Requests BOTTLES DRAWN AEROBIC AND ANAEROBIC 4 CC EACH   Final    Setup Time [id number]   Final    Culture     Final    Value:        BLOOD CULTURE RECEIVED NO GROWTH TO DATE CULTURE WILL BE HELD FOR 5 DAYS BEFORE ISSUING A FINAL NEGATIVE REPORT   Report Status PENDING   Incomplete   CULTURE, BLOOD (ROUTINE X 2)     Status: Normal  (Preliminary result)   Collection Time   09/19/11  8:40 PM      Component Value Range Status Comment   Specimen Description BLOOD LEFT ARM   Final    Special Requests BOTTLES DRAWN AEROBIC AND ANAEROBIC 10 CC EACH   Final    Setup Time [id number]   Final    Culture     Final    Value:        BLOOD CULTURE RECEIVED NO GROWTH TO DATE CULTURE WILL BE HELD FOR 5 DAYS BEFORE ISSUING A FINAL NEGATIVE REPORT   Report Status PENDING   Incomplete     Studies/Results: US Renal  09/19/2011  *RADIOLOGY REPORT*  Clinical Data: Renal failure.  Gross hematuria.  Elevated white blood count.  RENAL/URINARY TRACT ULTRASOUND COMPLETE  Comparison:  CT scan dated 09/19/2011  Findings:  Right Kidney:  Normal.  9.9 cm in length.  Left Kidney:  Normal.  10.6 cm in length.  Bladder:  Foley catheter in place.  However, the bladder is distended.  Bilateral ureteral jets are visualized.  The tip of a Foley catheter is adjacent to the dome of the bladder.  IMPRESSION:  1.  Normal kidneys. 2.  Distended urinary bladder.  Foley catheter in place with the tip against the superior wall of the bladder.  Original Report Authenticated By: Gwynn Burly, M.D.   US Abdomen Limited  09/20/2011  *RADIOLOGY REPORT*  LIMITED ABDOMINAL ULTRASOUND  The patient had prior CT scan which stated the patient had moderate ascites.  Ultrasound imaging was performed of all four quadrants of the abdomen and image documentation was obtained.  No ascites was noted on ultrasound for needle aspiration.  Impression:  Limited abdominal ultrasound revealing no ascites for paracentesis procedure.  Read by: Anselm Pancoast, P.A.-C  Original Report Authenticated By: Waynard Reeds, M.D.   Portable Chest 1 View  09/19/2011  *RADIOLOGY REPORT*  Clinical Data: Shortness of breath.  PORTABLE CHEST - 1 VIEW  Comparison: None.  Findings: Minimal linear atelectasis is seen in the periphery of the left lung base.  Lungs otherwise clear.  Heart size normal.  No  pneumothorax or effusion.  No focal bony abnormality.  IMPRESSION: No acute disease.  Original Report Authenticated By: Bernadene Bell. Maricela Curet, M.D.    Medications: Scheduled Meds:   . citalopram  30 mg Oral Daily  . clonazePAM  1 mg Oral TID  . docusate sodium  100 mg Oral BID  . levofloxacin  500 mg Oral Daily  . nicotine  14 mg Transdermal Daily  . DISCONTD: levofloxacin (LEVAQUIN) IV  500 mg Intravenous Q24H   Continuous Infusions:   . sodium chloride 125 mL/hr at 09/19/11 2130   PRN Meds:.acetaminophen, acetaminophen, alum & mag hydroxide-simeth, HYDROmorphone, morphine injection, ondansetron, ondansetron (ZOFRAN) IV, ondansetron, oxyCODONE, senna-docusate  Assessment/Plan:  Principal Problem:  *Abdominal pain, acute, bilateral lower quadrant - clinically improving - continue supportive care with pain control - CT abd/pelvis with contrast was recommended but pt refused - monitor clinically  Active Problems:  ARF (acute renal failure) - creatinine now within normal limits and at pt's baseline - BMP in AM   Leukocytosis - pt remains afebrile - WBC today within normal limits   Suicidal ideation - appreciate psych input - continue sitter at bedside   EDUCATION - test results and diagnostic studies were discussed with patient  - questions were answered at the bedside and contact information was provided for additional questions or concerns   LOS: 2 days   MAGICK-Angelos Wasco 09/21/2011, 4:54 PM  [facility]ITAL[facility] Pager: [phone]

## 2011-09-22 LAB — CBC
HCT: 35.7 % — ABNORMAL LOW (ref 39.0–52.0)
Hemoglobin: 12.3 g/dL — ABNORMAL LOW (ref 13.0–17.0)
MCH: 31.2 pg (ref 26.0–34.0)
MCHC: 34.5 g/dL (ref 30.0–36.0)
RDW: 13.4 % (ref 11.5–15.5)

## 2011-09-22 LAB — BASIC METABOLIC PANEL
BUN: 9 mg/dL (ref 6–23)
Calcium: 8.8 mg/dL (ref 8.4–10.5)
GFR calc Af Amer: >90 mL/min (ref >90)
GFR calc non Af Amer: >90 mL/min (ref >90)
Glucose, Bld: 93 mg/dL (ref 70–99)

## 2011-09-22 MED ORDER — HYDROMORPHONE HCL PF 2 MG/ML IJ SOLN
2.0000 mg | INTRAMUSCULAR | Status: DC | PRN
  Administered 2011-09-22 – 2011-09-26 (×34): 2 mg via INTRAVENOUS
  Filled 2011-09-22 (×13): qty 1
  Filled 2011-09-22: qty 2
  Filled 2011-09-22 (×22): qty 1

## 2011-09-22 MED ORDER — NICOTINE 21 MG/24HR TD PT24
21.0000 mg | MEDICATED_PATCH | Freq: Every day | TRANSDERMAL | Status: DC
  Administered 2011-09-22 – 2011-10-02 (×11): 21 mg via TRANSDERMAL
  Filled 2011-09-22 (×16): qty 1

## 2011-09-22 NOTE — Progress Notes (Signed)
Per [facility], Pt not appropriate for their facility.  [facility] recommends Pt be d/c'd to [facility] in [place].  Notified MD.  CSW to continue to follow.  Providence Crosby, LCSWA Clinical Social Work [phone]

## 2011-09-22 NOTE — Progress Notes (Signed)
[facility] reviewing notes to determine if Pt appropriate for their facility.  CSW to continue to follow.  Amanda Tylyn Stankovich, LCSWA Clinical Social Work [phone]  

## 2011-09-22 NOTE — Progress Notes (Signed)
Patient ID: Ian Ruiz, male   DOB: Jan 24, 1971, 41 y.o.   MRN: [id number]  Subjective: No events overnight. Patient denies chest pain, shortness of breath, reports persistent abdominal pain. Had bowel movement and reports ambulating.  Objective:  Vital signs in last 24 hours:  Filed Vitals:   09/21/11 1355 09/21/11 2137 09/22/11 0608 09/22/11 1500  BP: 129/62 110/71 103/64 123/83  Pulse: 53 64 51 57  Temp: 98 F (36.7 C) 98.6 F (37 C) 97.4 F (36.3 C) 98.1 F (36.7 C)  TempSrc: Oral Oral Oral Oral  Resp: 16 18 18 18   Height:      Weight:      SpO2: 95% 95% 96% 96%    Intake/Output from previous day:   Intake/Output Summary (Last 24 hours) at 09/22/11 1738 Last data filed at 09/22/11 1500  Gross per 24 hour  Intake   7720 ml  Output   1900 ml  Net   5820 ml    Physical Exam: General: Alert, awake, oriented x3, in no acute distress. HEENT: No bruits, no goiter. Moist mucous membranes, no scleral icterus, no conjunctival pallor. Heart: Regular rate and rhythm, S1/S2 +, no murmurs, rubs, gallops. Lungs: Clear to auscultation bilaterally. No wheezing, no rhonchi, no rales.  Abdomen: Soft, tender in suprapubic area, nondistended, positive bowel sounds. Extremities: No clubbing or cyanosis, no pitting edema,  positive pedal pulses. Neuro: Grossly nonfocal.  Lab Results:  Basic Metabolic Panel:    Component Value Date/Time   NA 137 09/22/2011 0450   K 4.2 09/22/2011 0450   CL 103 09/22/2011 0450   CO2 28 09/22/2011 0450   BUN 9 09/22/2011 0450   CREATININE 0.97 09/22/2011 0450   GLUCOSE 93 09/22/2011 0450   CALCIUM 8.8 09/22/2011 0450   CBC:    Component Value Date/Time   WBC 7.2 09/22/2011 0450   HGB 12.3* 09/22/2011 0450   HCT 35.7* 09/22/2011 0450   PLT 234 09/22/2011 0450   MCV 90.6 09/22/2011 0450   NEUTROABS 4.1 09/21/2011 0452   LYMPHSABS 2.1 09/21/2011 0452   MONOABS 0.7 09/21/2011 0452   EOSABS 0.3 09/21/2011 0452   BASOSABS 0.0 09/21/2011 0452      Lab  09/22/11 0450 09/21/11 0452 09/20/11 0440 09/19/11 1900 09/19/11 1150  WBC 7.2 7.2 10.9* 12.6* 14.1*  HGB 12.3* 10.8* 11.3* 13.1 14.3  HCT 35.7* 32.7* 34.5* 38.7* 43.4  PLT 234 210 260 292 244  MCV 90.6 90.8 91.5 88.6 89.5  MCH 31.2 30.0 30.0 30.0 29.5  MCHC 34.5 33.0 32.8 33.9 32.9  RDW 13.4 13.7 14.1 13.8 13.7  LYMPHSABS -- 2.1 2.4 2.0 0.7  MONOABS -- 0.7 0.9 0.7 0.8  EOSABS -- 0.3 0.4 0.1 0.1  BASOSABS -- 0.0 0.0 0.0 0.0  BANDABS -- -- -- -- --    Lab 09/22/11 0450 09/21/11 0452 09/20/11 0440 09/19/11 1907 09/19/11 1900 09/19/11 1150  NA 137 134* 136 -- 136 138  K 4.2 3.8 3.8 -- 4.1 4.2  CL 103 104 103 -- 104 99  CO2 28 25 26  -- 23 26  GLUCOSE 93 100* 105* -- 96 99  BUN 9 8 18  -- 25* 23  CREATININE 0.97 0.99 1.69* -- 3.42* 2.76*  CALCIUM 8.8 8.2* 8.2* -- 8.6 9.7  MG -- -- -- 1.9 -- --   No results found for this basename: INR:5,PROTIME:5 in the last 168 hours Cardiac markers:  Lab 09/19/11 1150  CKMB 1.7  TROPONINI <0.30  MYOGLOBIN --   No components  found with this basename: POCBNP:3 Recent Results (from the past 240 hour(s))  URINE CULTURE     Status: Normal   Collection Time   09/19/11  6:36 PM      Component Value Range Status Comment   Specimen Description URINE, CATHETERIZED   Final    Special Requests NONE   Final    Setup Time [id number]   Final    Colony Count NO GROWTH   Final    Culture NO GROWTH   Final    Report Status 09/20/2011 FINAL   Final   CULTURE, BLOOD (ROUTINE X 2)     Status: Normal (Preliminary result)   Collection Time   09/19/11  7:38 PM      Component Value Range Status Comment   Specimen Description BLOOD LEFT AC   Final    Special Requests BOTTLES DRAWN AEROBIC AND ANAEROBIC 4 CC EACH   Final    Setup Time [id number]   Final    Culture     Final    Value:        BLOOD CULTURE RECEIVED NO GROWTH TO DATE CULTURE WILL BE HELD FOR 5 DAYS BEFORE ISSUING A FINAL NEGATIVE REPORT   Report Status PENDING   Incomplete   CULTURE, BLOOD  (ROUTINE X 2)     Status: Normal (Preliminary result)   Collection Time   09/19/11  8:40 PM      Component Value Range Status Comment   Specimen Description BLOOD LEFT ARM   Final    Special Requests BOTTLES DRAWN AEROBIC AND ANAEROBIC 10 CC EACH   Final    Setup Time [id number]   Final    Culture     Final    Value:        BLOOD CULTURE RECEIVED NO GROWTH TO DATE CULTURE WILL BE HELD FOR 5 DAYS BEFORE ISSUING A FINAL NEGATIVE REPORT   Report Status PENDING   Incomplete     Studies/Results: No results found.  Medications: Scheduled Meds:   . citalopram  30 mg Oral Daily  . clonazePAM  1 mg Oral TID  . docusate sodium  100 mg Oral BID  . levofloxacin  500 mg Oral Daily  . nicotine  21 mg Transdermal Daily  . DISCONTD: nicotine  14 mg Transdermal Daily   Continuous Infusions:   . sodium chloride 125 mL/hr at 09/22/11 1203   PRN Meds:.acetaminophen, acetaminophen, alum & mag hydroxide-simeth, HYDROmorphone (DILAUDID) injection, morphine injection, ondansetron, ondansetron (ZOFRAN) IV, ondansetron, oxyCODONE, senna-docusate, DISCONTD: HYDROmorphone, DISCONTD:  HYDROmorphone (DILAUDID) injection  Assessment/Plan:  Principal Problem:  *Abdominal pain, acute, bilateral lower quadrant  - clinically improving  - continue supportive care with pain control  - CT abd/pelvis with contrast was recommended but pt refused  - monitor clinically   Active Problems:  ARF (acute renal failure)  - creatinine now within normal limits and at pt's baseline  - BMP in AM   Leukocytosis  - pt remains afebrile  - WBC today within normal limits   Suicidal ideation  - appreciate psych input  - continue sitter at bedside   EDUCATION  - test results and diagnostic studies were discussed with patient  - questions were answered at the bedside and contact information was provided for additional questions or concerns   LOS: 3 days   MAGICK-Archita Lomeli 09/22/2011, 5:38 PM  [facility]  HOSPITALIST Pager: [phone]

## 2011-09-22 NOTE — Progress Notes (Signed)
[facility] still reviewing Pt for possible admission.  CSW to continue to follow.  Providence Crosby, LCSWA Clinical Social Work [phone]

## 2011-09-23 MED ORDER — DIPHENHYDRAMINE HCL 50 MG/ML IJ SOLN
25.0000 mg | INTRAMUSCULAR | Status: DC | PRN
  Administered 2011-09-23 – 2011-09-28 (×20): 25 mg via INTRAVENOUS
  Administered 2011-09-28: 21:00:00 via INTRAVENOUS
  Administered 2011-09-29 – 2011-10-07 (×31): 25 mg via INTRAVENOUS
  Filled 2011-09-23 (×54): qty 1

## 2011-09-23 NOTE — Progress Notes (Signed)
Patient ID: Lyndall Windt, male   DOB: 1970/09/24, 41 y.o.   MRN: [id number]  Subjective: No events overnight. Patient denies chest pain, shortness of breath. Had bowel movement and reports ambulating.  Objective:  Vital signs in last 24 hours:  Filed Vitals:   09/22/11 0608 09/22/11 1500 09/22/11 2200 09/23/11 0508  BP: 103/64 123/83 124/80 117/77  Pulse: 51 57 58 77  Temp: 97.4 F (36.3 C) 98.1 F (36.7 C) 97.8 F (36.6 C) 98.3 F (36.8 C)  TempSrc: Oral Oral Oral Oral  Resp: 18 18 18 16   Height:      Weight:    82.101 kg (181 lb)  SpO2: 96% 96% 98% 95%    Intake/Output from previous day:   Intake/Output Summary (Last 24 hours) at 09/23/11 1029 Last data filed at 09/23/11 0849  Gross per 24 hour  Intake 6526.25 ml  Output   2050 ml  Net 4476.25 ml    Physical Exam: General: Alert, awake, oriented x3, in no acute distress. HEENT: No bruits, no goiter. Moist mucous membranes, no scleral icterus, no conjunctival pallor. Heart: Regular rate and rhythm, S1/S2 +, no murmurs, rubs, gallops. Lungs: Clear to auscultation bilaterally. No wheezing, no rhonchi, no rales.  Abdomen: Soft, nontender, nondistended, positive bowel sounds. Extremities: No clubbing or cyanosis, no pitting edema,  positive pedal pulses. Neuro: Grossly nonfocal.  Lab Results:  Basic Metabolic Panel:    Component Value Date/Time   NA 137 09/22/2011 0450   K 4.2 09/22/2011 0450   CL 103 09/22/2011 0450   CO2 28 09/22/2011 0450   BUN 9 09/22/2011 0450   CREATININE 0.97 09/22/2011 0450   GLUCOSE 93 09/22/2011 0450   CALCIUM 8.8 09/22/2011 0450   CBC:    Component Value Date/Time   WBC 7.2 09/22/2011 0450   HGB 12.3* 09/22/2011 0450   HCT 35.7* 09/22/2011 0450   PLT 234 09/22/2011 0450   MCV 90.6 09/22/2011 0450   NEUTROABS 4.1 09/21/2011 0452   LYMPHSABS 2.1 09/21/2011 0452   MONOABS 0.7 09/21/2011 0452   EOSABS 0.3 09/21/2011 0452   BASOSABS 0.0 09/21/2011 0452      Lab 09/22/11 0450 09/21/11 0452  09/20/11 0440 09/19/11 1900 09/19/11 1150  WBC 7.2 7.2 10.9* 12.6* 14.1*  HGB 12.3* 10.8* 11.3* 13.1 14.3  HCT 35.7* 32.7* 34.5* 38.7* 43.4  PLT 234 210 260 292 244  MCV 90.6 90.8 91.5 88.6 89.5  MCH 31.2 30.0 30.0 30.0 29.5  MCHC 34.5 33.0 32.8 33.9 32.9  RDW 13.4 13.7 14.1 13.8 13.7  LYMPHSABS -- 2.1 2.4 2.0 0.7  MONOABS -- 0.7 0.9 0.7 0.8  EOSABS -- 0.3 0.4 0.1 0.1  BASOSABS -- 0.0 0.0 0.0 0.0  BANDABS -- -- -- -- --    Lab 09/22/11 0450 09/21/11 0452 09/20/11 0440 09/19/11 1907 09/19/11 1900 09/19/11 1150  NA 137 134* 136 -- 136 138  K 4.2 3.8 3.8 -- 4.1 4.2  CL 103 104 103 -- 104 99  CO2 28 25 26  -- 23 26  GLUCOSE 93 100* 105* -- 96 99  BUN 9 8 18  -- 25* 23  CREATININE 0.97 0.99 1.69* -- 3.42* 2.76*  CALCIUM 8.8 8.2* 8.2* -- 8.6 9.7  MG -- -- -- 1.9 -- --   No results found for this basename: INR:5,PROTIME:5 in the last 168 hours Cardiac markers:  Lab 09/19/11 1150  CKMB 1.7  TROPONINI <0.30  MYOGLOBIN --   No components found with this basename: POCBNP:3 Recent Results (from  the past 240 hour(s))  URINE CULTURE     Status: Normal   Collection Time   09/19/11  6:36 PM      Component Value Range Status Comment   Specimen Description URINE, CATHETERIZED   Final    Special Requests NONE   Final    Setup Time [id number]   Final    Colony Count NO GROWTH   Final    Culture NO GROWTH   Final    Report Status 09/20/2011 FINAL   Final   CULTURE, BLOOD (ROUTINE X 2)     Status: Normal (Preliminary result)   Collection Time   09/19/11  7:38 PM      Component Value Range Status Comment   Specimen Description BLOOD LEFT AC   Final    Special Requests BOTTLES DRAWN AEROBIC AND ANAEROBIC 4 CC EACH   Final    Setup Time [id number]   Final    Culture     Final    Value:        BLOOD CULTURE RECEIVED NO GROWTH TO DATE CULTURE WILL BE HELD FOR 5 DAYS BEFORE ISSUING A FINAL NEGATIVE REPORT   Report Status PENDING   Incomplete   CULTURE, BLOOD (ROUTINE X 2)     Status:  Normal (Preliminary result)   Collection Time   09/19/11  8:40 PM      Component Value Range Status Comment   Specimen Description BLOOD LEFT ARM   Final    Special Requests BOTTLES DRAWN AEROBIC AND ANAEROBIC 10 CC EACH   Final    Setup Time [id number]   Final    Culture     Final    Value:        BLOOD CULTURE RECEIVED NO GROWTH TO DATE CULTURE WILL BE HELD FOR 5 DAYS BEFORE ISSUING A FINAL NEGATIVE REPORT   Report Status PENDING   Incomplete     Studies/Results: No results found.  Medications: Scheduled Meds:   . citalopram  30 mg Oral Daily  . clonazePAM  1 mg Oral TID  . docusate sodium  100 mg Oral BID  . levofloxacin  500 mg Oral Daily  . nicotine  21 mg Transdermal Daily   Continuous Infusions:   . sodium chloride 150 mL/hr at 09/23/11 0743   PRN Meds:.acetaminophen, acetaminophen, alum & mag hydroxide-simeth, HYDROmorphone (DILAUDID) injection, morphine injection, ondansetron, ondansetron (ZOFRAN) IV, ondansetron, oxyCODONE, senna-docusate  Assessment/Plan:  Principal Problem:  *Abdominal pain, acute, bilateral lower quadrant  - clinically improving  - continue supportive care with pain control  - CT abd/pelvis with contrast was recommended but pt refused  - monitor clinically   Active Problems:  ARF (acute renal failure)  - creatinine now within normal limits and at pt's baseline  - BMP in AM   Leukocytosis  - pt remains afebrile  - WBC today within normal limits   Suicidal ideation  - appreciate psych input  - continue sitter at bedside   EDUCATION  - test results and diagnostic studies were discussed with patient  - questions were answered at the bedside and contact information was provided for additional questions or concerns     LOS: 4 days   MAGICK-Tryniti Laatsch 09/23/2011, 10:29 AM  [facility]ITAL[facility] Pager: [phone]

## 2011-09-23 NOTE — Progress Notes (Signed)
CSW sent clinicals to [facility].  Awaiting acceptance/bed availability.  CSW continues to follow.

## 2011-09-23 NOTE — Progress Notes (Signed)
   CARE MANAGEMENT NOTE 09/23/2011  Patient:  Ian Ruiz, Ian Ruiz   Account Number:  [id number]  Date Initiated:  09/20/2011  Documentation initiated by:  Lanier Clam  Subjective/Objective Assessment:   ADMITTED W/ABD PAIN.IN ROUTE TO [facility] FOR ADMISSION-SUICIDAL IDEATION,& DRUG ABUSE.NW:GNFAOZH/YQMVHQIONG/[id number] ABUSE.     Action/Plan:   PSYCH/CLINREHAB @ D/C.   Anticipated DC Date:  09/26/2011   Anticipated DC Plan:  PSYCHIATRIC HOSPITAL  In-house referral  Clinical Social Worker      DC Planning Services  Medication Assistance      Choice offered to / List presented to:             Status of service:  In process, will continue to follow Medicare Important Message given?   (If response is "NO", the following Medicare IM given date fields will be blank) Date Medicare IM given:   Date Additional Medicare IM given:    Discharge Disposition:    Per UR Regulation:  Reviewed for med. necessity/level of care/duration of stay  Comments:  09/23/11 Braiden Rodman RN,BSN NCM [phone] CONTINUE TO FOLLOW FOR PROGRESS,& ASST W/D/C NEEDS.PSYCH/PSYCH SW FOLLOWING. D/C PLAN INPT PSYCH.NOTED NOT [facility] APPROPRIATE.AWAITING AUTH FROM THE STATE.  09/20/11 Dominique Calvey RN,BSN NCM [phone] QUALIFIES FOR INDIGENT FUNDS IF NEEDED.

## 2011-09-24 MED ORDER — SENNOSIDES-DOCUSATE SODIUM 8.6-50 MG PO TABS
2.0000 | ORAL_TABLET | Freq: Every day | ORAL | Status: DC
  Administered 2011-09-24 – 2011-10-06 (×13): 2 via ORAL
  Filled 2011-09-24 (×14): qty 2

## 2011-09-24 MED ORDER — POLYETHYLENE GLYCOL 3350 17 G PO PACK
17.0000 g | PACK | Freq: Every day | ORAL | Status: DC
  Administered 2011-09-24 – 2011-09-30 (×7): 17 g via ORAL
  Filled 2011-09-24 (×7): qty 1

## 2011-09-24 NOTE — Progress Notes (Signed)
Patient ID: Ian Ruiz, male   DOB: [birth year], 41 y.o.   MRN: [id number]  Subjective: No events overnight. Patient denies chest pain, shortness of breath. Has persistent abdominal pain.  Objective:  Vital signs in last 24 hours:  Filed Vitals:   09/23/11 0508 09/23/11 1400 09/23/11 2146 09/24/11 0600  BP: 117/77 130/83 156/92 112/71  Pulse: 77 61 62 75  Temp: 98.3 F (36.8 C) 97.6 F (36.4 C) 98.6 F (37 C) 99.5 F (37.5 C)  TempSrc: Oral Oral Oral Oral  Resp: 16 18 20 18   Height:      Weight: 82.101 kg (181 lb)     SpO2: 95% 96% 98% 92%    Intake/Output from previous day:   Intake/Output Summary (Last 24 hours) at 09/24/11 0845 Last data filed at 09/24/11 0600  Gross per 24 hour  Intake   2285 ml  Output   2275 ml  Net     10 ml    Physical Exam: General: Alert, awake, oriented x3, in no acute distress. HEENT: No bruits, no goiter. Moist mucous membranes, no scleral icterus, no conjunctival pallor. Heart: Regular rate and rhythm, S1/S2 +, no murmurs, rubs, gallops. Lungs: Clear to auscultation bilaterally. No wheezing, no rhonchi, no rales.  Abdomen: Soft, nontender, nondistended, positive bowel sounds. Extremities: No clubbing or cyanosis, no pitting edema,  positive pedal pulses. Neuro: Grossly nonfocal.  Lab Results:  Basic Metabolic Panel:    Component Value Date/Time   NA 137 09/22/2011 0450   K 4.2 09/22/2011 0450   CL 103 09/22/2011 0450   CO2 28 09/22/2011 0450   BUN 9 09/22/2011 0450   CREATININE 0.97 09/22/2011 0450   GLUCOSE 93 09/22/2011 0450   CALCIUM 8.8 09/22/2011 0450   CBC:    Component Value Date/Time   WBC 7.2 09/22/2011 0450   HGB 12.3* 09/22/2011 0450   HCT 35.7* 09/22/2011 0450   PLT 234 09/22/2011 0450   MCV 90.6 09/22/2011 0450   NEUTROABS 4.1 09/21/2011 0452   LYMPHSABS 2.1 09/21/2011 0452   MONOABS 0.7 09/21/2011 0452   EOSABS 0.3 09/21/2011 0452   BASOSABS 0.0 09/21/2011 0452      Lab 09/22/11 0450 09/21/11 0452 09/20/11 0440  09/19/11 1900 09/19/11 1150  WBC 7.2 7.2 10.9* 12.6* 14.1*  HGB 12.3* 10.8* 11.3* 13.1 14.3  HCT 35.7* 32.7* 34.5* 38.7* 43.4  PLT 234 210 260 292 244  MCV 90.6 90.8 91.5 88.6 89.5  MCH 31.2 30.0 30.0 30.0 29.5  MCHC 34.5 33.0 32.8 33.9 32.9  RDW 13.4 13.7 14.1 13.8 13.7  LYMPHSABS -- 2.1 2.4 2.0 0.7  MONOABS -- 0.7 0.9 0.7 0.8  EOSABS -- 0.3 0.4 0.1 0.1  BASOSABS -- 0.0 0.0 0.0 0.0  BANDABS -- -- -- -- --    Lab 09/22/11 0450 09/21/11 0452 09/20/11 0440 09/19/11 1907 09/19/11 1900 09/19/11 1150  NA 137 134* 136 -- 136 138  K 4.2 3.8 3.8 -- 4.1 4.2  CL 103 104 103 -- 104 99  CO2 28 25 26  -- 23 26  GLUCOSE 93 100* 105* -- 96 99  BUN 9 8 18  -- 25* 23  CREATININE 0.97 0.99 1.69* -- 3.42* 2.76*  CALCIUM 8.8 8.2* 8.2* -- 8.6 9.7  MG -- -- -- 1.9 -- --   No results found for this basename: INR:5,PROTIME:5 in the last 168 hours Cardiac markers:  Lab 09/19/11 1150  CKMB 1.7  TROPONINI <0.30  MYOGLOBIN --   No components found with this basename:  POCBNP:3 Recent Results (from the past 240 hour(s))  URINE CULTURE     Status: Normal   Collection Time   09/19/11  6:36 PM      Component Value Range Status Comment   Specimen Description URINE, CATHETERIZED   Final    Special Requests NONE   Final    Setup Time [id number]   Final    Colony Count NO GROWTH   Final    Culture NO GROWTH   Final    Report Status 09/20/2011 FINAL   Final   CULTURE, BLOOD (ROUTINE X 2)     Status: Normal (Preliminary result)   Collection Time   09/19/11  7:38 PM      Component Value Range Status Comment   Specimen Description BLOOD LEFT AC   Final    Special Requests BOTTLES DRAWN AEROBIC AND ANAEROBIC 4 CC EACH   Final    Setup Time [id number]   Final    Culture     Final    Value:        BLOOD CULTURE RECEIVED NO GROWTH TO DATE CULTURE WILL BE HELD FOR 5 DAYS BEFORE ISSUING A FINAL NEGATIVE REPORT   Report Status PENDING   Incomplete   CULTURE, BLOOD (ROUTINE X 2)     Status: Normal  (Preliminary result)   Collection Time   09/19/11  8:40 PM      Component Value Range Status Comment   Specimen Description BLOOD LEFT ARM   Final    Special Requests BOTTLES DRAWN AEROBIC AND ANAEROBIC 10 CC EACH   Final    Setup Time [id number]   Final    Culture     Final    Value:        BLOOD CULTURE RECEIVED NO GROWTH TO DATE CULTURE WILL BE HELD FOR 5 DAYS BEFORE ISSUING A FINAL NEGATIVE REPORT   Report Status PENDING   Incomplete     Studies/Results: No results found.  Medications: Scheduled Meds:   . citalopram  30 mg Oral Daily  . clonazePAM  1 mg Oral TID  . docusate sodium  100 mg Oral BID  . levofloxacin  500 mg Oral Daily  . nicotine  21 mg Transdermal Daily   Continuous Infusions:   . sodium chloride 150 mL/hr at 09/23/11 1353   PRN Meds:.acetaminophen, acetaminophen, alum & mag hydroxide-simeth, diphenhydrAMINE, HYDROmorphone (DILAUDID) injection, morphine injection, ondansetron, ondansetron (ZOFRAN) IV, ondansetron, oxyCODONE, senna-docusate  Assessment/Plan:  Principal Problem:  *Abdominal pain, acute, bilateral lower quadrant  - clinically improving  - continue supportive care with pain control  - CT abd/pelvis with contrast was recommended but pt refused  - monitor clinically  Active Problems:  ARF (acute renal failure)  - creatinine now within normal limits and at pt's baseline  - BMP in AM  Leukocytosis  - pt remains afebrile  - WBC today within normal limits  Suicidal ideation  - appreciate psych input  - continue sitter at bedside  EDUCATION  - test results and diagnostic studies were discussed with patient  - questions were answered at the bedside and contact information was provided for additional questions or concerns    LOS: 5 days   MAGICK-Trestin Vences 09/24/2011, 8:45 AM  [facility] Pager: [phone]

## 2011-09-24 NOTE — Progress Notes (Signed)
Pt continues to c/o lower abdominal pain.  Directly after administering pain medication pt asks when he can have his next pain med. Falls asleep during conversations. Forgets what he was talking about mid sentence. Became agitated when This nurse would not push narcotic IV med in fast and also when this nurse would not flush behind pain medicine. This nurse used IV fluids running at 150/hr through pump as flush. Pt states" I still think about slitting my wrists sometimes because I am depressed but do not plan on doing it in the hospital because you all will just save me"

## 2011-09-25 NOTE — Progress Notes (Signed)
Patient continued to complain of constant abdominal pain rated at 8 or 9 throughout 7A-7P shift.  Continuously asking for increases in po pain medication as well.  RN attempted to educate patient that 2 mg of Dilaudid IV every 2 hours was a considerable amount of pain medication because patient stated he had a kidney stone and this was not managing his pain.  Also taking OxyIR 5 mg every 4 hours.  Did speak with MD earlier in the shift regarding patient's constant use of IV Dilaudid and constant requests for additional pain medications.

## 2011-09-25 NOTE — Plan of Care (Signed)
Problem: Phase I Progression Outcomes Goal: Pain controlled with appropriate interventions Outcome: Not Progressing Pt  Constantly complains of severe abdominal pain that he requests IV and po pain medicine each time he can have it, IV every 2 hours.  MD notified

## 2011-09-25 NOTE — Progress Notes (Signed)
Patient ID: Ian Ruiz, male   DOB: 11/16/1970, 41 y.o.   MRN: [id number]  Subjective: No events overnight. Patient denies chest pain, shortness of breath, abdominal pain. Had bowel movement and reports ambulating.  Objective:  Vital signs in last 24 hours:  Filed Vitals:   09/24/11 2158 09/25/11 0207 09/25/11 0600 09/25/11 0630  BP: 129/77 110/69  107/65  Pulse: 80 82  76  Temp: 99.9 F (37.7 C) 98.6 F (37 C)  99.3 F (37.4 C)  TempSrc: Axillary   Oral  Resp: 17 18  18   Height:      Weight:   88.3 kg (194 lb 10.7 oz) 86.7 kg (191 lb 2.2 oz)  SpO2: 96% 93%  92%    Intake/Output from previous day:   Intake/Output Summary (Last 24 hours) at 09/25/11 0929 Last data filed at 09/24/11 2159  Gross per 24 hour  Intake 4612.5 ml  Output   1800 ml  Net 2812.5 ml    Physical Exam: General: Alert, awake, oriented x3, in no acute distress. HEENT: No bruits, no goiter. Moist mucous membranes, no scleral icterus, no conjunctival pallor. Heart: Regular rate and rhythm, S1/S2 +, no murmurs, rubs, gallops. Lungs: Clear to auscultation bilaterally. No wheezing, no rhonchi, no rales.  Abdomen: Soft, nontender, nondistended, positive bowel sounds. Extremities: No clubbing or cyanosis, no pitting edema,  positive pedal pulses. Neuro: Grossly nonfocal.  Lab Results:  Basic Metabolic Panel:    Component Value Date/Time   NA 137 09/22/2011 0450   K 4.2 09/22/2011 0450   CL 103 09/22/2011 0450   CO2 28 09/22/2011 0450   BUN 9 09/22/2011 0450   CREATININE 0.97 09/22/2011 0450   GLUCOSE 93 09/22/2011 0450   CALCIUM 8.8 09/22/2011 0450   CBC:    Component Value Date/Time   WBC 7.2 09/22/2011 0450   HGB 12.3* 09/22/2011 0450   HCT 35.7* 09/22/2011 0450   PLT 234 09/22/2011 0450   MCV 90.6 09/22/2011 0450   NEUTROABS 4.1 09/21/2011 0452   LYMPHSABS 2.1 09/21/2011 0452   MONOABS 0.7 09/21/2011 0452   EOSABS 0.3 09/21/2011 0452   BASOSABS 0.0 09/21/2011 0452      Lab 09/22/11 0450  09/21/11 0452 09/20/11 0440 09/19/11 1900 09/19/11 1150  WBC 7.2 7.2 10.9* 12.6* 14.1*  HGB 12.3* 10.8* 11.3* 13.1 14.3  HCT 35.7* 32.7* 34.5* 38.7* 43.4  PLT 234 210 260 292 244  MCV 90.6 90.8 91.5 88.6 89.5  MCH 31.2 30.0 30.0 30.0 29.5  MCHC 34.5 33.0 32.8 33.9 32.9  RDW 13.4 13.7 14.1 13.8 13.7  LYMPHSABS -- 2.1 2.4 2.0 0.7  MONOABS -- 0.7 0.9 0.7 0.8  EOSABS -- 0.3 0.4 0.1 0.1  BASOSABS -- 0.0 0.0 0.0 0.0  BANDABS -- -- -- -- --    Lab 09/22/11 0450 09/21/11 0452 09/20/11 0440 09/19/11 1907 09/19/11 1900 09/19/11 1150  NA 137 134* 136 -- 136 138  K 4.2 3.8 3.8 -- 4.1 4.2  CL 103 104 103 -- 104 99  CO2 28 25 26  -- 23 26  GLUCOSE 93 100* 105* -- 96 99  BUN 9 8 18  -- 25* 23  CREATININE 0.97 0.99 1.69* -- 3.42* 2.76*  CALCIUM 8.8 8.2* 8.2* -- 8.6 9.7  MG -- -- -- 1.9 -- --   No results found for this basename: INR:5,PROTIME:5 in the last 168 hours Cardiac markers:  Lab 09/19/11 1150  CKMB 1.7  TROPONINI <0.30  MYOGLOBIN --   No components found with  this basename: POCBNP:3 Recent Results (from the past 240 hour(s))  URINE CULTURE     Status: Normal   Collection Time   09/19/11  6:36 PM      Component Value Range Status Comment   Specimen Description URINE, CATHETERIZED   Final    Special Requests NONE   Final    Setup Time [id number]   Final    Colony Count NO GROWTH   Final    Culture NO GROWTH   Final    Report Status 09/20/2011 FINAL   Final   CULTURE, BLOOD (ROUTINE X 2)     Status: Normal (Preliminary result)   Collection Time   09/19/11  7:38 PM      Component Value Range Status Comment   Specimen Description BLOOD LEFT AC   Final    Special Requests BOTTLES DRAWN AEROBIC AND ANAEROBIC 4 CC EACH   Final    Setup Time [id number]   Final    Culture     Final    Value:        BLOOD CULTURE RECEIVED NO GROWTH TO DATE CULTURE WILL BE HELD FOR 5 DAYS BEFORE ISSUING A FINAL NEGATIVE REPORT   Report Status PENDING   Incomplete   CULTURE, BLOOD (ROUTINE X 2)      Status: Normal (Preliminary result)   Collection Time   09/19/11  8:40 PM      Component Value Range Status Comment   Specimen Description BLOOD LEFT ARM   Final    Special Requests BOTTLES DRAWN AEROBIC AND ANAEROBIC 10 CC EACH   Final    Setup Time [id number]   Final    Culture     Final    Value:        BLOOD CULTURE RECEIVED NO GROWTH TO DATE CULTURE WILL BE HELD FOR 5 DAYS BEFORE ISSUING A FINAL NEGATIVE REPORT   Report Status PENDING   Incomplete     Studies/Results: No results found.  Medications: Scheduled Meds:   . citalopram  30 mg Oral Daily  . clonazePAM  1 mg Oral TID  . docusate sodium  100 mg Oral BID  . levofloxacin  500 mg Oral Daily  . nicotine  21 mg Transdermal Daily  . polyethylene glycol  17 g Oral Daily  . senna-docusate  2 tablet Oral Daily   Continuous Infusions:   . sodium chloride 150 mL/hr at 09/25/11 4696   PRN Meds:.acetaminophen, acetaminophen, alum & mag hydroxide-simeth, diphenhydrAMINE, HYDROmorphone (DILAUDID) injection, ondansetron, ondansetron (ZOFRAN) IV, ondansetron, oxyCODONE, senna-docusate, DISCONTD:  morphine injection  Assessment/Plan:  Principal Problem:  *Abdominal pain, acute, bilateral lower quadrant  - clinically improving  - continue supportive care with pain control  - CT abd/pelvis with contrast was recommended but pt refused  - monitor clinically  Active Problems:  ARF (acute renal failure)  - creatinine now within normal limits and at pt's baseline  - BMP in AM  Leukocytosis  - pt remains afebrile  - WBC today within normal limits  Suicidal ideation  - appreciate psych input  - continue sitter at bedside  EDUCATION  - test results and diagnostic studies were discussed with patient  - questions were answered at the bedside and contact information was provided for additional questions or concerns     LOS: 6 days   MAGICK-Chey Cho 09/25/2011, 9:29 AM  [facility] Pager: [phone]

## 2011-09-26 LAB — CULTURE, BLOOD (ROUTINE X 2)
Culture  Setup Time: 201301220142
Culture: NO GROWTH

## 2011-09-26 MED ORDER — HYDROMORPHONE HCL PF 1 MG/ML IJ SOLN
1.0000 mg | INTRAMUSCULAR | Status: DC | PRN
  Administered 2011-09-26 (×2): 1 mg via INTRAVENOUS
  Filled 2011-09-26 (×2): qty 1

## 2011-09-26 MED ORDER — HYDROMORPHONE HCL PF 2 MG/ML IJ SOLN
2.0000 mg | INTRAMUSCULAR | Status: DC | PRN
  Administered 2011-09-26 – 2011-09-27 (×5): 2 mg via INTRAVENOUS
  Filled 2011-09-26 (×6): qty 1

## 2011-09-26 MED ORDER — HYDROMORPHONE HCL PF 1 MG/ML IJ SOLN
INTRAMUSCULAR | Status: AC
  Administered 2011-09-26: 1 mg
  Filled 2011-09-26: qty 1

## 2011-09-26 MED ORDER — HYDROMORPHONE HCL PF 2 MG/ML IJ SOLN: 2.0000 mg | INTRAMUSCULAR | Status: DC | PRN

## 2011-09-26 MED ORDER — OXYCODONE HCL 5 MG PO TABS
10.0000 mg | ORAL_TABLET | ORAL | Status: DC | PRN
  Administered 2011-09-26 – 2011-09-27 (×6): 10 mg via ORAL
  Filled 2011-09-26 (×6): qty 2

## 2011-09-26 NOTE — Progress Notes (Signed)
Patient ID: Ian Ruiz, male   DOB: Jul 10, 1971, 41 y.o.   MRN: [id number]  Subjective: No events overnight. Patient denies chest pain, shortness of breath, abdominal pain. Had bowel movement and reports ambulating.  Objective:  Vital signs in last 24 hours:  Filed Vitals:   09/25/11 1257 09/25/11 1400 09/25/11 2146 09/26/11 0600  BP: 132/81  123/76 104/60  Pulse: 93  83 77  Temp: 101.5 F (38.6 C) 99.8 F (37.7 C) 99.5 F (37.5 C) 98.2 F (36.8 C)  TempSrc: Oral  Oral Oral  Resp: 18  18 20   Height:      Weight:    86.7 kg (191 lb 2.2 oz)  SpO2: 95%  92% 92%    Intake/Output from previous day:   Intake/Output Summary (Last 24 hours) at 09/26/11 0824 Last data filed at 09/26/11 0700  Gross per 24 hour  Intake   1680 ml  Output   2050 ml  Net   -370 ml    Physical Exam: General: Alert, awake, oriented x3, in no acute distress. HEENT: No bruits, no goiter. Moist mucous membranes, no scleral icterus, no conjunctival pallor. Heart: Regular rate and rhythm, S1/S2 +, no murmurs, rubs, gallops. Lungs: Clear to auscultation bilaterally. No wheezing, no rhonchi, no rales.  Abdomen: Soft, nontender, nondistended, positive bowel sounds. Extremities: No clubbing or cyanosis, no pitting edema,  positive pedal pulses. Neuro: Grossly nonfocal.  Lab Results:  Basic Metabolic Panel:    Component Value Date/Time   NA 137 09/22/2011 0450   K 4.2 09/22/2011 0450   CL 103 09/22/2011 0450   CO2 28 09/22/2011 0450   BUN 9 09/22/2011 0450   CREATININE 0.97 09/22/2011 0450   GLUCOSE 93 09/22/2011 0450   CALCIUM 8.8 09/22/2011 0450   CBC:    Component Value Date/Time   WBC 7.2 09/22/2011 0450   HGB 12.3* 09/22/2011 0450   HCT 35.7* 09/22/2011 0450   PLT 234 09/22/2011 0450   MCV 90.6 09/22/2011 0450   NEUTROABS 4.1 09/21/2011 0452   LYMPHSABS 2.1 09/21/2011 0452   MONOABS 0.7 09/21/2011 0452   EOSABS 0.3 09/21/2011 0452   BASOSABS 0.0 09/21/2011 0452      Lab 09/22/11 0450 09/21/11  0452 09/20/11 0440 09/19/11 1900 09/19/11 1150  WBC 7.2 7.2 10.9* 12.6* 14.1*  HGB 12.3* 10.8* 11.3* 13.1 14.3  HCT 35.7* 32.7* 34.5* 38.7* 43.4  PLT 234 210 260 292 244  MCV 90.6 90.8 91.5 88.6 89.5  MCH 31.2 30.0 30.0 30.0 29.5  MCHC 34.5 33.0 32.8 33.9 32.9  RDW 13.4 13.7 14.1 13.8 13.7  LYMPHSABS -- 2.1 2.4 2.0 0.7  MONOABS -- 0.7 0.9 0.7 0.8  EOSABS -- 0.3 0.4 0.1 0.1  BASOSABS -- 0.0 0.0 0.0 0.0  BANDABS -- -- -- -- --    Lab 09/22/11 0450 09/21/11 0452 09/20/11 0440 09/19/11 1907 09/19/11 1900 09/19/11 1150  NA 137 134* 136 -- 136 138  K 4.2 3.8 3.8 -- 4.1 4.2  CL 103 104 103 -- 104 99  CO2 28 25 26  -- 23 26  GLUCOSE 93 100* 105* -- 96 99  BUN 9 8 18  -- 25* 23  CREATININE 0.97 0.99 1.69* -- 3.42* 2.76*  CALCIUM 8.8 8.2* 8.2* -- 8.6 9.7  MG -- -- -- 1.9 -- --   No results found for this basename: INR:5,PROTIME:5 in the last 168 hours Cardiac markers:  Lab 09/19/11 1150  CKMB 1.7  TROPONINI <0.30  MYOGLOBIN --   No components  found with this basename: POCBNP:3 Recent Results (from the past 240 hour(s))  URINE CULTURE     Status: Normal   Collection Time   09/19/11  6:36 PM      Component Value Range Status Comment   Specimen Description URINE, CATHETERIZED   Final    Special Requests NONE   Final    Setup Time [id number]   Final    Colony Count NO GROWTH   Final    Culture NO GROWTH   Final    Report Status 09/20/2011 FINAL   Final   CULTURE, BLOOD (ROUTINE X 2)     Status: Normal (Preliminary result)   Collection Time   09/19/11  7:38 PM      Component Value Range Status Comment   Specimen Description BLOOD LEFT AC   Final    Special Requests BOTTLES DRAWN AEROBIC AND ANAEROBIC 4 CC EACH   Final    Setup Time [id number]   Final    Culture     Final    Value:        BLOOD CULTURE RECEIVED NO GROWTH TO DATE CULTURE WILL BE HELD FOR 5 DAYS BEFORE ISSUING A FINAL NEGATIVE REPORT   Report Status PENDING   Incomplete   CULTURE, BLOOD (ROUTINE X 2)     Status:  Normal (Preliminary result)   Collection Time   09/19/11  8:40 PM      Component Value Range Status Comment   Specimen Description BLOOD LEFT ARM   Final    Special Requests BOTTLES DRAWN AEROBIC AND ANAEROBIC 10 CC EACH   Final    Setup Time [id number]   Final    Culture     Final    Value:        BLOOD CULTURE RECEIVED NO GROWTH TO DATE CULTURE WILL BE HELD FOR 5 DAYS BEFORE ISSUING A FINAL NEGATIVE REPORT   Report Status PENDING   Incomplete     Studies/Results: No results found.  Medications: Scheduled Meds:   . citalopram  30 mg Oral Daily  . clonazePAM  1 mg Oral TID  . docusate sodium  100 mg Oral BID  . levofloxacin  500 mg Oral Daily  . nicotine  21 mg Transdermal Daily  . polyethylene glycol  17 g Oral Daily  . senna-docusate  2 tablet Oral Daily   Continuous Infusions:   . DISCONTD: sodium chloride 150 mL/hr at 09/25/11 2130   PRN Meds:.acetaminophen, acetaminophen, alum & mag hydroxide-simeth, diphenhydrAMINE, ondansetron, ondansetron (ZOFRAN) IV, ondansetron, oxyCODONE, senna-docusate, DISCONTD:  HYDROmorphone (DILAUDID) injection, DISCONTD: oxyCODONE  Assessment/Plan: Principal Problem:  *Abdominal pain, acute, bilateral lower quadrant  - clinically improving  - continue supportive care with pain control  - CT abd/pelvis with contrast was recommended but pt refused  - monitor clinically   Active Problems:  ARF (acute renal failure)  - creatinine now within normal limits and at pt's baseline   Leukocytosis  - pt remains afebrile  - WBC within normal limits   Suicidal ideation  - appreciate psych input  - continue sitter at bedside   EDUCATION  - test results and diagnostic studies were discussed with patient  - questions were answered at the bedside and contact information was provided for additional questions or concerns  - awaiting placement    LOS: 7 days   MAGICK-Madolin Twaddle 09/26/2011, 8:24 AM  [facility] Pager:  [phone]

## 2011-09-26 NOTE — Progress Notes (Signed)
Per [facility], Pt still on the waiting list.  Amanda Marina Boerner, LCSWA Clinical Social Work [phone]  

## 2011-09-27 LAB — URINALYSIS, ROUTINE W REFLEX MICROSCOPIC
Hgb urine dipstick: NEGATIVE
Ketones, ur: NEGATIVE mg/dL
Leukocytes, UA: NEGATIVE
Protein, ur: NEGATIVE mg/dL
Urobilinogen, UA: 0.2 mg/dL (ref 0.0–1.0)

## 2011-09-27 MED ORDER — OXYCODONE HCL 5 MG PO TABS
15.0000 mg | ORAL_TABLET | ORAL | Status: DC | PRN
  Administered 2011-09-27 – 2011-09-28 (×6): 15 mg via ORAL
  Filled 2011-09-27 (×8): qty 3

## 2011-09-27 MED ORDER — HYDROMORPHONE HCL PF 1 MG/ML IJ SOLN
1.0000 mg | INTRAMUSCULAR | Status: AC | PRN
  Administered 2011-09-27 – 2011-09-28 (×13): 1 mg via INTRAVENOUS
  Filled 2011-09-27 (×14): qty 1

## 2011-09-27 NOTE — Plan of Care (Signed)
Problem: Inadequate Intake (NI-2.1) Goal: Food and/or nutrient delivery Individualized approach for food/nutrient provision.  Outcome: Progressing Pt ate 75% of breakfast per pt report. He states nausea and pain improved today

## 2011-09-27 NOTE — Progress Notes (Signed)
Nutrition Follow-up  Diet Order: Regular with paper service r/t suicide precautions  - Pt reports not eating well the past few days r/t pain and nausea, which she states are currently under control. Pt reports eating 75% of breakfast this morning. A follow up CT of abdomen/pelvis was recommended for pt's abdominal pain, however pt refused.   Meds: Scheduled Meds:   . citalopram  30 mg Oral Daily  . clonazePAM  1 mg Oral TID  . docusate sodium  100 mg Oral BID  . HYDROmorphone      . nicotine  21 mg Transdermal Daily  . polyethylene glycol  17 g Oral Daily  . senna-docusate  2 tablet Oral Daily   Continuous Infusions:  PRN Meds:.acetaminophen, acetaminophen, alum & mag hydroxide-simeth, diphenhydrAMINE, HYDROmorphone (DILAUDID) injection, ondansetron, ondansetron (ZOFRAN) IV, ondansetron, oxyCODONE, senna-docusate, DISCONTD:  HYDROmorphone (DILAUDID) injection, DISCONTD:  HYDROmorphone (DILAUDID) injection, DISCONTD:  HYDROmorphone (DILAUDID) injection, DISCONTD: oxyCODONE  Labs:  CMP     Component Value Date/Time   NA 137 09/22/2011 0450   K 4.2 09/22/2011 0450   CL 103 09/22/2011 0450   CO2 28 09/22/2011 0450   GLUCOSE 93 09/22/2011 0450   BUN 9 09/22/2011 0450   CREATININE 0.97 09/22/2011 0450   CALCIUM 8.8 09/22/2011 0450   PROT 7.9 09/19/2011 1150   ALBUMIN 4.0 09/19/2011 1150   AST 15 09/19/2011 1150   ALT 15 09/19/2011 1150   ALKPHOS 65 09/19/2011 1150   BILITOT 0.6 09/19/2011 1150   GFRNONAA >90 09/22/2011 0450   GFRAA >90 09/22/2011 0450     Intake/Output Summary (Last 24 hours) at 09/27/11 1144 Last data filed at 09/27/11 0520  Gross per 24 hour  Intake    240 ml  Output   2026 ml  Net  -1786 ml  Last BM - 09/26/11  Weight Status:   1/21 81.1kg 1/29 85.1kg  Nutrition Dx:  Predicted suboptimal energy intake - improving  Goal:  Pt to consume >75% of meals - met, however previously only eating few bites of meals and ice cream for the past few days.   New goal: Pt to  consistently consume >75% of meals.   Intervention: Encouraged increased intake as nausea under control. No educational needs at this time.   Monitor: Weights, intake, labs   Marshall Cork Pager #:  [phone]

## 2011-09-27 NOTE — Progress Notes (Signed)
   CARE MANAGEMENT NOTE 09/27/2011  Patient:  Ian Ruiz, Ian Ruiz   Account Number:  [id number]  Date Initiated:  09/20/2011  Documentation initiated by:  Lanier Clam  Subjective/Objective Assessment:   ADMITTED W/ABD PAIN.IN ROUTE TO [facility] FOR ADMISSION-SUICIDAL IDEATION,& DRUG ABUSE.WU:[id number] ABUSE.     Action/Plan:   PSYCH/CLINREHAB @ D/C.   Anticipated DC Date:  09/29/2011   Anticipated DC Plan:  PSYCHIATRIC HOSPITAL  In-house referral  Clinical Social Worker      DC Planning Services  Medication Assistance      Choice offered to / List presented to:             Status of service:  In process, will continue to follow Medicare Important Message given?   (If response is "NO", the following Medicare IM given date fields will be blank) Date Medicare IM given:   Date Additional Medicare IM given:    Discharge Disposition:    Per UR Regulation:  Reviewed for med. necessity/level of care/duration of stay  Comments:  09/27/11 [name] RN,BSN NCM [phone] MED STABLE.AWAITING AUTH FROM STATE FOR INPT PSYCH FACILITY.PSYCH SW FOLLOWING DILIGENTLY. 09/23/11 [name] RN,BSN NCM [phone] CONTINUE TO FOLLOW FOR PROGRESS,& ASST W/D/C NEEDS.PSYCH/PSYCH SW FOLLOWING. D/C PLAN INPT PSYCH.NOTED NOT [facility] APPROPRIATE.AWAITING AUTH FROM THE STATE.  09/20/11 [name] RN,BSN NCM [phone] QUALIFIES FOR INDIGENT FUNDS IF NEEDED.

## 2011-09-27 NOTE — Progress Notes (Signed)
Per Pt's request, faxed note to [facility] stating that Pt is currently hospitalized and that he will be unable to attend his hearing today.  Placed a copy of this letter in Pt's chart.  Provided Pt with a copy of this letter.  Called the DA's office and was informed that Pt's case will be continued.  Pt is to follow up with the DA's office to learn his next court date.    CSW notified Pt of the above information and provided Pt with the DA's phone number.  Providence Crosby, LCSWA Clinical Social Work [phone]

## 2011-09-27 NOTE — Progress Notes (Signed)
Updated IVC paperwork.  Obtained MD's signature.  Notified Magistrate that CSW faxing IVC paperwork.  Confirmed with Magistrate receipt of IVC paperwork.  Providence Crosby, LCSWA Clinical Social Work [phone]

## 2011-09-27 NOTE — Progress Notes (Addendum)
Patient ID: Ian Ruiz, male   DOB: 03-28-1971, 41 y.o.   MRN: [id number]  Subjective: No events overnight. Patient denies chest pain, shortness of breath, abdominal pain. Had bowel movement and reports ambulating.  Objective:  Vital signs in last 24 hours:  Filed Vitals:   09/26/11 1619 09/26/11 2210 09/27/11 0500 09/27/11 0520  BP:  124/81  125/85  Pulse:  90  90  Temp: 100.5 F (38.1 C) 98.6 F (37 C)  100.1 F (37.8 C)  TempSrc: Oral Oral  Oral  Resp:  18  18  Height:      Weight:   85.1 kg (187 lb 9.8 oz)   SpO2:  96%  92%    Intake/Output from previous day:   Intake/Output Summary (Last 24 hours) at 09/27/11 0924 Last data filed at 09/27/11 0520  Gross per 24 hour  Intake    480 ml  Output   2726 ml  Net  -2246 ml    Physical Exam: General: Alert, awake, oriented x3, in no acute distress. HEENT: No bruits, no goiter. Moist mucous membranes, no scleral icterus, no conjunctival pallor. Heart: Regular rate and rhythm, S1/S2 +, no murmurs, rubs, gallops. Lungs: Clear to auscultation bilaterally. No wheezing, no rhonchi, no rales.  Abdomen: Soft, nontender, nondistended, positive bowel sounds. Extremities: No clubbing or cyanosis, no pitting edema,  positive pedal pulses. Neuro: Grossly nonfocal.  Lab Results:  Basic Metabolic Panel:    Component Value Date/Time   NA 137 09/22/2011 0450   K 4.2 09/22/2011 0450   CL 103 09/22/2011 0450   CO2 28 09/22/2011 0450   BUN 9 09/22/2011 0450   CREATININE 0.97 09/22/2011 0450   GLUCOSE 93 09/22/2011 0450   CALCIUM 8.8 09/22/2011 0450   CBC:    Component Value Date/Time   WBC 7.2 09/22/2011 0450   HGB 12.3* 09/22/2011 0450   HCT 35.7* 09/22/2011 0450   PLT 234 09/22/2011 0450   MCV 90.6 09/22/2011 0450   NEUTROABS 4.1 09/21/2011 0452   LYMPHSABS 2.1 09/21/2011 0452   MONOABS 0.7 09/21/2011 0452   EOSABS 0.3 09/21/2011 0452   BASOSABS 0.0 09/21/2011 0452      Lab 09/22/11 0450 09/21/11 0452  WBC 7.2 7.2  HGB 12.3*  10.8*  HCT 35.7* 32.7*  PLT 234 210  MCV 90.6 90.8  MCH 31.2 30.0  MCHC 34.5 33.0  RDW 13.4 13.7  LYMPHSABS -- 2.1  MONOABS -- 0.7  EOSABS -- 0.3  BASOSABS -- 0.0  BANDABS -- --    Lab 09/22/11 0450 09/21/11 0452  NA 137 134*  K 4.2 3.8  CL 103 104  CO2 28 25  GLUCOSE 93 100*  BUN 9 8  CREATININE 0.97 0.99  CALCIUM 8.8 8.2*  MG -- --    Recent Results (from the past 240 hour(s))  URINE CULTURE     Status: Normal   Collection Time   09/19/11  6:36 PM      Component Value Range Status Comment   Specimen Description URINE, CATHETERIZED   Final    Special Requests NONE   Final    Culture  Setup Time [id number]   Final    Colony Count NO GROWTH   Final    Culture NO GROWTH   Final    Report Status 09/20/2011 FINAL   Final   CULTURE, BLOOD (ROUTINE X 2)     Status: Normal   Collection Time   09/19/11  7:38 PM  Component Value Range Status Comment   Specimen Description BLOOD LEFT AC   Final    Special Requests BOTTLES DRAWN AEROBIC AND ANAEROBIC 4 CC EACH   Final    Culture  Setup Time [id number]   Final    Culture NO GROWTH 5 DAYS   Final    Report Status 09/26/2011 FINAL   Final   CULTURE, BLOOD (ROUTINE X 2)     Status: Normal   Collection Time   09/19/11  8:40 PM      Component Value Range Status Comment   Specimen Description BLOOD LEFT ARM   Final    Special Requests BOTTLES DRAWN AEROBIC AND ANAEROBIC 10 CC EACH   Final    Culture  Setup Time [id number]   Final    Culture NO GROWTH 5 DAYS   Final    Report Status 09/26/2011 FINAL   Final     Studies/Results: No results found.  Medications: Scheduled Meds:   . citalopram  30 mg Oral Daily  . clonazePAM  1 mg Oral TID  . docusate sodium  100 mg Oral BID  . HYDROmorphone      . nicotine  21 mg Transdermal Daily  . polyethylene glycol  17 g Oral Daily  . senna-docusate  2 tablet Oral Daily   Continuous Infusions:  PRN Meds:.acetaminophen, acetaminophen, alum & mag hydroxide-simeth,  diphenhydrAMINE, HYDROmorphone (DILAUDID) injection, ondansetron, ondansetron (ZOFRAN) IV, ondansetron, oxyCODONE, senna-docusate, DISCONTD:  HYDROmorphone (DILAUDID) injection, DISCONTD:  HYDROmorphone (DILAUDID) injection  Assessment/Plan:  Principal Problem:  *Abdominal pain, acute, bilateral lower quadrant - Pt with persistent abdominal pain and seeking more narcotics for control of pain - discussed with patient worry for drug seeking and he has agreed to stay on current medication regimen - please note that CT abdomen and pelvis was recommended early during the course of hospitalization but pt has refused - ensure regular bowel regimen - worrisome fever spikes and pt passed stone today, this is certainly worrisome for an infection - send kidney stone for analysis - no abx yet since pt just completed the course of Levaquin - follow up on CBC and BMP  Active Problems:  ARF (acute renal failure) - this has now resolved - will obtain BMP in AM   Leukocytosis - this is now resolved but worrisome are the occasional fever spikes - pt denies any chest pain, shortness of breath, no specific urinary concerns - he has history of kidney stones and please note that upon admission he has been treated with Levaquin for total 7 days - pt declines CT of abdomen and pelvis but will send kidney stone for analysis   Suicidal ideation - awaiting placement at state institution   Bipolar disorder (manic depression) - continue Citalopram   EDUCATION - test results and diagnostic studies were discussed with patient  - patient verbalized the understanding - questions were answered at the bedside and contact information was provided for additional questions or concerns - awaiting placement, pt stable for d/c when bed available   LOS: 8 days   MAGICK-Deano Tomaszewski 09/27/2011, 9:24 AM  [facility] Pager: [phone]

## 2011-09-28 LAB — CBC
MCHC: 32.7 g/dL (ref 30.0–36.0)
MCV: 90.1 fL (ref 78.0–100.0)
Platelets: 238 10*3/uL (ref 150–400)
RDW: 13.3 % (ref 11.5–15.5)
WBC: 7.4 10*3/uL (ref 4.0–10.5)

## 2011-09-28 LAB — BASIC METABOLIC PANEL
Chloride: 95 mEq/L — ABNORMAL LOW (ref 96–112)
Creatinine, Ser: 1.09 mg/dL (ref 0.50–1.35)
GFR calc Af Amer: >90 mL/min (ref >90)
GFR calc non Af Amer: 82 mL/min — ABNORMAL LOW (ref >90)

## 2011-09-28 MED ORDER — OXYCODONE HCL 5 MG PO TABS
30.0000 mg | ORAL_TABLET | ORAL | Status: DC | PRN
  Administered 2011-09-28 – 2011-10-01 (×14): 30 mg via ORAL
  Filled 2011-09-28: qty 1
  Filled 2011-09-28 (×3): qty 6
  Filled 2011-09-28: qty 1
  Filled 2011-09-28 (×6): qty 6
  Filled 2011-09-28: qty 5
  Filled 2011-09-28 (×4): qty 6

## 2011-09-28 NOTE — Progress Notes (Signed)
Patient discussed at the Long Length of Stay Ian Ruiz Weeks 09/28/2011  
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
## 2011-09-28 NOTE — Consult Note (Addendum)
Reason for Consult:  Suicidal ideation Referring Physician: Dr. Gilford Silvius is an 41 y.o. male.  HPI: Patient was delivered to the [facility] long ED by a friend who said they have been doing drugs. This patient was unconscious intubated and extubated over a period of days. When first evaluated he was decidedly fed up with his life, decidedly determined to kill himself and be successful the next time. He denied any drug use. He said everything had been wrong in his life and he couldn't corrected. There was no reason he should live any longer. [name] AXIS I    Bipolar I Disorder, polysubstance abuse,  AXIS II   Deferred AXISIII Past Medical History  Diagnosis Date  . Ulcerative colitis   . Meniere disease   . Bipolar 1 disorder, depressed   . Substance abuse   AXIS IV   Unemployment, finances, housing, parental issues, AXIS V   GAF  45  History reviewed. No pertinent past surgical history.  History reviewed. No pertinent family history.  Social History:  reports that he has been smoking.  He has never used smokeless tobacco. He reports that he uses illicit drugs (Marijuana and Cocaine). He reports that he does not drink alcohol.  Allergies:  Allergies  Allergen Reactions  . Penicillins Rash    Medications: I have reviewed the patient's current medications.  Results for orders placed during the hospital encounter of 09/19/11 (from the past 48 hour(s))  URINALYSIS, ROUTINE W REFLEX MICROSCOPIC     Status: Normal   Collection Time   09/27/11  3:42 PM      Component Value Range Comment   Color, Urine YELLOW  YELLOW     APPearance CLEAR  CLEAR     Specific Gravity, Urine 1.006  1.005 - 1.030     pH 7.0  5.0 - 8.0     Glucose, UA NEGATIVE  NEGATIVE (mg/dL)    Hgb urine dipstick NEGATIVE  NEGATIVE     Bilirubin Urine NEGATIVE  NEGATIVE     Ketones, ur NEGATIVE  NEGATIVE (mg/dL)    Protein, ur NEGATIVE  NEGATIVE (mg/dL)    Urobilinogen, UA 0.2  0.0 - 1.0 (mg/dL)    Nitrite  NEGATIVE  NEGATIVE     Leukocytes, UA NEGATIVE  NEGATIVE  MICROSCOPIC NOT DONE ON URINES WITH NEGATIVE PROTEIN, BLOOD, LEUKOCYTES, NITRITE, OR GLUCOSE <1000 mg/dL.  CBC     Status: Abnormal   Collection Time   09/28/11  5:20 AM      Component Value Range Comment   WBC 7.4  4.0 - 10.5 (K/uL)    RBC 4.35  4.22 - 5.81 (MIL/uL)    Hemoglobin 12.8 (*) 13.0 - 17.0 (g/dL)    HCT 16.1  09.6 - 04.5 (%)    MCV 90.1  78.0 - 100.0 (fL)    MCH 29.4  26.0 - 34.0 (pg)    MCHC 32.7  30.0 - 36.0 (g/dL)    RDW 40.9  81.1 - 91.4 (%)    Platelets 238  150 - 400 (K/uL)   BASIC METABOLIC PANEL     Status: Abnormal   Collection Time   09/28/11  5:20 AM      Component Value Range Comment   Sodium 135  135 - 145 (mEq/L)    Potassium 4.3  3.5 - 5.1 (mEq/L)    Chloride 95 (*) 96 - 112 (mEq/L)    CO2 31  19 - 32 (mEq/L)    Glucose, Bld 100 (*)  70 - 99 (mg/dL)    BUN 13  6 - 23 (mg/dL)    Creatinine, Ser 5.28  0.50 - 1.35 (mg/dL)    Calcium 9.4  8.4 - 10.5 (mg/dL)    GFR calc non Af Amer 82 (*) >90 (mL/min)    GFR calc Af Amer >90  >90 (mL/min)     No results found.  Review of Systems  Unable to perform ROS: other   Blood pressure 104/70, pulse 80, temperature 98.3 F (36.8 C), temperature source Oral, resp. rate 18, height 5\' 10"  (1.778 m), weight 84 kg (185 lb 3 oz), SpO2 91.00%. Physical Exam  Assessment/Plan: Chart is reviewed  Discussed with RN and Psych CSW The patient is sitting up in bed. He is neat, alert with minimal eye contact. He has normal rate of speech and is very articulate. He states that he remains determined to end his life he uses a lot of delusions but when questioned for specifics he states "I'm going to check out". He goes on to say that nothing is turning out in his life he expresses a sense of failure and he does not get to see his children as he would like. He believes his 43-year-old son is alienated from him. He he further states that he does not want to be this 41 year old  homeless created who roams the streets with a shopping basket and digs in barrels for food. He is told that there is a plan to have him admitted to the state hospital. He looks surprised and asked what happened to admission to behavioral health inpatient? He is told that the determination is made once the social worker makes a report to the hospitals and they make a determination if a bed is available. At this time the request for admission is pending at [facility].  The nurse is told that the recommendation will be to continue a sitter for safety. The patient calls to discuss the fact that he has apparently thought about his situation because he said "I don't want to go to the state hospital". Now he states that he has thought about it and wants to go to a rehabilitation program. This is after he spent time in this interview saying that he only took his described medication Klonopin "as prescribed". He is told that his request will be relayed but it will most likely be processed with a referral from the psychiatric inpatient unit he will be on.  Patient reveals that he is guarded and makes claims inconsistent with what record and diagnostics report.  His prognosis is guarded due to his inability to admit his real intentions.  E.g. Decidedly suicidal and in next breath wants to go to rehab.  He has not exhibited any signs of bizarre, psychotic behavior or speech; only suicidal ideation.  RECOMMENDATION: 1. Continue established sitter for suicide watch. 2. Continue disposition for the most appropriate inpatient psychiatric hospital. 3. No further psychiatric need is identified.  MD psychiatrist signing off  Cain Fitzhenry 09/28/2011, 5:07 PM

## 2011-09-28 NOTE — Progress Notes (Signed)
Subjective: Continues to complain of intermittent suprapubic pain. Spent a long time discussing details of pain medication dosage, with this MD.  Objective: Vital signs in last 24 hours: Temp:  [98.1 F (36.7 C)-98.7 F (37.1 C)] 98.3 F (36.8 C) (01/30 1336) Pulse Rate:  [75-83] 80  (01/30 1336) Resp:  [16-20] 18  (01/30 1336) BP: (104-131)/(61-70) 104/70 mmHg (01/30 1336) SpO2:  [91 %-95 %] 91 % (01/30 1336) Weight change: -1.1 kg (-2 lb 6.8 oz) Last BM Date: 09/26/11  Intake/Output from previous day: 01/29 0701 - 01/30 0700 In: 960 [P.O.:960] Out: 2150 [Urine:2150] Total I/O In: 840 [P.O.:840] Out: 500 [Urine:500]   Physical Exam: General: Comfortable, alert, communicative, fully oriented, not short of breath at rest.  HEENT:  No clinical pallor, no jaundice, no conjunctival injection or discharge. Hydration status is OK. NECK:  Supple, JVP not seen, no carotid bruits, no palpable lymphadenopathy, no palpable goiter. CHEST:  Clinically clear to auscultation, no wheezes, no crackles. HEART:  Sounds 1 and 2 heard, normal, regular, no murmurs. ABDOMEN:  Full, soft, non-tender, no palpable organomegaly, no palpable masses, normal bowel sounds. GENITALIA:  Not examined. LOWER EXTREMITIES:  No pitting edema, palpable peripheral pulses. MUSCULOSKELETAL SYSTEM:  Unremarkable. CENTRAL NERVOUS SYSTEM:  No focal neurologic deficit on gross examination.  Lab Results:  [facility] 09/28/11 0520  WBC 7.4  HGB 12.8*  HCT 39.2  PLT 238    Basename 09/28/11 0520  NA 135  K 4.3  CL 95*  CO2 31  GLUCOSE 100*  BUN 13  CREATININE 1.09  CALCIUM 9.4   Recent Results (from the past 240 hour(s))  URINE CULTURE     Status: Normal   Collection Time   09/19/11  6:36 PM      Component Value Range Status Comment   Specimen Description URINE, CATHETERIZED   Final    Special Requests NONE   Final    Culture  Setup Time [id number]   Final    Colony Count NO GROWTH   Final    Culture  NO GROWTH   Final    Report Status 09/20/2011 FINAL   Final   CULTURE, BLOOD (ROUTINE X 2)     Status: Normal   Collection Time   09/19/11  7:38 PM      Component Value Range Status Comment   Specimen Description BLOOD LEFT AC   Final    Special Requests BOTTLES DRAWN AEROBIC AND ANAEROBIC 4 CC EACH   Final    Culture  Setup Time [id number]   Final    Culture NO GROWTH 5 DAYS   Final    Report Status 09/26/2011 FINAL   Final   CULTURE, BLOOD (ROUTINE X 2)     Status: Normal   Collection Time   09/19/11  8:40 PM      Component Value Range Status Comment   Specimen Description BLOOD LEFT ARM   Final    Special Requests BOTTLES DRAWN AEROBIC AND ANAEROBIC 10 CC EACH   Final    Culture  Setup Time [id number]   Final    Culture NO GROWTH 5 DAYS   Final    Report Status 09/26/2011 FINAL   Final      Studies/Results: No results found.  Medications: Scheduled Meds:   . citalopram  30 mg Oral Daily  . clonazePAM  1 mg Oral TID  . docusate sodium  100 mg Oral BID  . nicotine  21 mg Transdermal Daily  . polyethylene glycol  17 g Oral Daily  . senna-docusate  2 tablet Oral Daily   Continuous Infusions:  PRN Meds:.acetaminophen, acetaminophen, alum & mag hydroxide-simeth, diphenhydrAMINE, HYDROmorphone (DILAUDID) injection, ondansetron, ondansetron (ZOFRAN) IV, ondansetron, oxyCODONE, senna-docusate  Assessment/Plan: Principal Problem:  *Abdominal pain, acute, bilateral lower quadrant  Patient continues to complain of recurrent lower abdominal/suprapubic pain and seeking more narcotics for control of pain. Patient is s/p Abdo ct scan on 09/19/11, which showed nonobstructing 4 mm right renal  Calculus. In the last few days, he has spiked temperature a few times, and passed a stone on 09/27/11, which was reportedly sent for analysis. Patient just completed a 7 day course of Levaquin on 09/21/11. [facility] consult Urology, in AM of 09/29/11. Meanwhile, D/C dilaudid, and try Oxycodone 30 m prn q4  hours, for pain. Active Problems:  1. ARF (acute renal failure): Likely prerenal, secondary to dehydration. This has now resolved, with iv fluids.  2. Leukocytosis: Likely secondary to UTI. Now resolved. Urinalysis is negative at this time.  3. Suicidal ideation: Awaiting placement at state institution  4. Bipolar disorder (manic depression)  Citalopram, per psychiatrist.   Comment; Patient was re-evaluated by Dr Ferol Luz on 09/28/11, and recommendations for inpatient psychiatric treatment, remain unchanged.     LOS: 9 days   Jenniger Figiel,[name] 09/28/2011, 6:16 PM

## 2011-09-28 NOTE — Progress Notes (Signed)
Spoke with Junious Dresser at [facility] who stated that they lost Pt's information and requested that CSW fax it again.  Faxed information on Pt to State.  CSW to continue to follow.  Providence Crosby, LCSWA Clinical Social Work [phone]

## 2011-09-29 LAB — CBC
HCT: 38.7 % — ABNORMAL LOW (ref 39.0–52.0)
MCHC: 33.6 g/dL (ref 30.0–36.0)
Platelets: 254 10*3/uL (ref 150–400)
RDW: 13.2 % (ref 11.5–15.5)
WBC: 7.6 10*3/uL (ref 4.0–10.5)

## 2011-09-29 LAB — IRON AND TIBC
Iron: 47 ug/dL (ref 42–135)
TIBC: 265 ug/dL (ref 215–435)

## 2011-09-29 LAB — FERRITIN: Ferritin: 204 ng/mL (ref 22–322)

## 2011-09-29 LAB — HEPATIC FUNCTION PANEL
ALT: 14 U/L (ref 0–53)
AST: 12 U/L (ref 0–37)
Albumin: 3.1 g/dL — ABNORMAL LOW (ref 3.5–5.2)
Total Protein: 7.2 g/dL (ref 6.0–8.3)

## 2011-09-29 LAB — BASIC METABOLIC PANEL
BUN: 17 mg/dL (ref 6–23)
Chloride: 96 mEq/L (ref 96–112)
GFR calc Af Amer: >90 mL/min (ref >90)
GFR calc non Af Amer: 84 mL/min — ABNORMAL LOW (ref >90)
Potassium: 4.2 mEq/L (ref 3.5–5.1)
Sodium: 135 mEq/L (ref 135–145)

## 2011-09-29 NOTE — Progress Notes (Signed)
Initial visit with pt on referral by nursing.    Pt spoke with chaplain about his motivations going forward.  He intends to utilize resources from his veterans benefits and attempt to gain some IT certifications.  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   Pt feels pressure to begin the process of "moving forward," as he wishes to be able to prove himself and sped more time with his children (7, 9).  Pt has limited contact with his family.  His children and ex-wife live with his mother in [place].   Pt expressed sadness and grief around relationship with children.   Pt anxious about the possibility of going to state hospital.   Chaplain provided support for pt, engaged in narrative work and grief work around lost time with children.  Will continue to follow pt and provide support during admission.  Please page as needs arise.      09/29/11 1600  Clinical Encounter Type  Visited With Patient  Visit Type Initial;Spiritual support;Psychological support  Referral From Nurse  Spiritual Encounters  Spiritual Needs Emotional;Grief support  Stress Factors  Patient Stress Factors Financial concerns;Family relationships

## 2011-09-29 NOTE — Progress Notes (Signed)
Message from [place] at [place] stating that the MD there is requesting the following: Repeat liver function Hep serology HIV Iron panel Last 3 days of MD prog notes Pt tolerating a regular diet?  Notified Care coordinator, Marcelino Duster, and MD of State's request.  Providence Crosby, [facility] Clinical Social Work [phone]

## 2011-09-29 NOTE — Progress Notes (Signed)
Faxed repeat liver function and past 3 days of MD notes to [facility].  Notified State that, per RN, Pt is tolerating a regular diet.  HIV, Iron Panel and Hep Serology results have yet to return.  CSW to continue to follow.  Providence Crosby, LCSWA Clinical Social Work [phone]

## 2011-09-29 NOTE — Progress Notes (Signed)
Subjective:. Had a good night, pain-wise. No new issues.  Objective: Vital signs in last 24 hours: Temp:  [97.8 F (36.6 C)-98.3 F (36.8 C)] 97.8 F (36.6 C) (01/31 0600) Pulse Rate:  [73-83] 73  (01/31 0600) Resp:  [18] 18  (01/31 0600) BP: (93-104)/(59-70) 93/59 mmHg (01/31 0600) SpO2:  [91 %-94 %] 93 % (01/31 0600) Weight:  [81.1 kg (178 lb 12.7 oz)] 81.1 kg (178 lb 12.7 oz) (01/30 1911) Weight change: -2.9 kg (-6 lb 6.3 oz) Last BM Date: 09/28/11  Intake/Output from previous day: 01/30 0701 - 01/31 0700 In: 2000 [P.O.:2000] Out: 1800 [Urine:1500; Stool:300] Total I/O In: 480 [P.O.:480] Out: -    Physical Exam: General: Comfortable, alert, communicative, fully oriented, not short of breath at rest.  HEENT:  No clinical pallor, no jaundice, no conjunctival injection or discharge. Hydration status is OK. NECK:  Supple, JVP not seen, no carotid bruits, no palpable lymphadenopathy, no palpable goiter. CHEST:  Clinically clear to auscultation, no wheezes, no crackles. HEART:  Sounds 1 and 2 heard, normal, regular, no murmurs. ABDOMEN:  Full, soft, non-tender, no palpable organomegaly, no palpable masses, normal bowel sounds. GENITALIA:  Not examined. LOWER EXTREMITIES:  No pitting edema, palpable peripheral pulses. MUSCULOSKELETAL SYSTEM:  Unremarkable. CENTRAL NERVOUS SYSTEM:  No focal neurologic deficit on gross examination.  Lab Results:  [facility] 09/29/11 0510 09/28/11 0520  WBC 7.6 7.4  HGB 13.0 12.8*  HCT 38.7* 39.2  PLT 254 238    Basename 09/29/11 0510 09/28/11 0520  NA 135 135  K 4.2 4.3  CL 96 95*  CO2 28 31  GLUCOSE 102* 100*  BUN 17 13  CREATININE 1.07 1.09  CALCIUM 9.4 9.4   Recent Results (from the past 240 hour(s))  URINE CULTURE     Status: Normal   Collection Time   09/19/11  6:36 PM      Component Value Range Status Comment   Specimen Description URINE, CATHETERIZED   Final    Special Requests NONE   Final    Culture  Setup Time  [id number]   Final    Colony Count NO GROWTH   Final    Culture NO GROWTH   Final    Report Status 09/20/2011 FINAL   Final   CULTURE, BLOOD (ROUTINE X 2)     Status: Normal   Collection Time   09/19/11  7:38 PM      Component Value Range Status Comment   Specimen Description BLOOD LEFT AC   Final    Special Requests BOTTLES DRAWN AEROBIC AND ANAEROBIC 4 CC EACH   Final    Culture  Setup Time [id number]   Final    Culture NO GROWTH 5 DAYS   Final    Report Status 09/26/2011 FINAL   Final   CULTURE, BLOOD (ROUTINE X 2)     Status: Normal   Collection Time   09/19/11  8:40 PM      Component Value Range Status Comment   Specimen Description BLOOD LEFT ARM   Final    Special Requests BOTTLES DRAWN AEROBIC AND ANAEROBIC 10 CC EACH   Final    Culture  Setup Time [id number]   Final    Culture NO GROWTH 5 DAYS   Final    Report Status 09/26/2011 FINAL   Final      Studies/Results: No results found.  Medications: Scheduled Meds:    . citalopram  30 mg Oral Daily  . clonazePAM  1 mg Oral  TID  . docusate sodium  100 mg Oral BID  . nicotine  21 mg Transdermal Daily  . polyethylene glycol  17 g Oral Daily  . senna-docusate  2 tablet Oral Daily   Continuous Infusions:  PRN Meds:.acetaminophen, acetaminophen, alum & mag hydroxide-simeth, diphenhydrAMINE, HYDROmorphone (DILAUDID) injection, ondansetron, ondansetron (ZOFRAN) IV, ondansetron, oxyCODONE, senna-docusate, DISCONTD: oxyCODONE  Assessment/Plan: Principal Problem:  *Abdominal pain, acute, bilateral lower quadrant  Patient continues to complain of recurrent lower abdominal/suprapubic pain and seeking more narcotics for control of pain. Patient is s/p Abdo ct scan on 09/19/11, which showed nonobstructing 4 mm right renal  Calculus. In the last few days, he has spiked temperature a few times, and passed a stone on 09/27/11, which was reportedly sent for analysis. Patient just completed a 7 day course of Levaquin on 09/21/11. Have  consult Urology, in AM of 09/29/11. Meanwhile, on Oxycodone 30 m prn q4 hours, for pain. Active Problems:  1. ARF (acute renal failure): Likely prerenal, secondary to dehydration. This has now resolved, with iv fluids.  2. Leukocytosis: Likely secondary to UTI. Now resolved. Urinalysis is negative at this time.  3. Suicidal ideation: Awaiting placement at state institution  4. Bipolar disorder (manic depression)  Citalopram, per psychiatrist.   Comment; Patient was re-evaluated by Dr Ferol Luz on 09/28/11, and recommendations for inpatient psychiatric treatment, remain unchanged. Await urology recommendations.    LOS: 10 days   Tiyonna Sardinha,Ian Ruiz 09/29/2011, 9:59 AM

## 2011-09-29 NOTE — Progress Notes (Signed)
Pt is c/o abd pain not helped with po pain med.  Dr Brien Few sent a text page. Pt is requesting dilaudid for pain.

## 2011-09-29 NOTE — Progress Notes (Signed)
Pt continues to c/o pain in abd requesting dilaudid or morphine.  State "I just need a one time dose"  Spoke with Dr Brien Few, and informed him of pt request.  Pt is getting 30mg  oxycodone every 4 hours as needed ,and Dr Brien Few wants to continue the po pain meds for now.  Informed pt.

## 2011-09-29 NOTE — Consult Note (Signed)
Urology Consult  Referring physician: Dr Isidor Holts Reason for referral: Right non obstructing renal calculus.  Chief Complaint: Abdominal pain  History of Present Illness: Patient is a 41 years old male who was admitted on 09/19/2011 complaining of lower abdomen pain. He has a past history of bipolar disorder and polysubstance abuse. CT scan without IV contrast revealed a 4 mm non-obstructing right renal calculus in the mid/upper pole. There is no evidence of hydronephrosis or ureteral calculi. In reviewing the patient's chart it was noted that he passed a stone 1/29 that was sent for analysis. He has continued to complain of pain. At this time he states that the pain is at the base of the penis. He states that he has a history of kidney stone and the pain that he has is similar to the pain that he had before. He also states that he has been passing blood in his urine. According to the patient the pain is excruciating at times. He states that he has been having fever on and off from 97-102. Urinalysis on 1/29 revealed no rbc's or wbc's.  Past Medical History  Diagnosis Date  . Ulcerative colitis   . Meniere disease   . Bipolar 1 disorder, depressed   . Substance abuse    History reviewed. No pertinent past surgical history.  Medications: Benadryl, Zofran, oxycodone 30 mg every 4 hours, Celexa, Klonopin, MiraLAX. Allergies:  Allergies  Allergen Reactions  . Penicillins Rash    History reviewed. No pertinent family history. Social History:  reports that he has been smoking.  He has never used smokeless tobacco. He reports that he uses illicit drugs (Marijuana and Cocaine). He reports that he does not drink alcohol.  ROS: All systems are reviewed and negative except as noted.   Physical Exam:  Vital signs in last 24 hours: Temp:  [97.8 F (36.6 C)-98.4 F (36.9 C)] 97.9 F (36.6 C) (01/31 1404) Pulse Rate:  [73-88] 83  (01/31 1404) Resp:  [18] 18  (01/31 1404) BP:  (93-111)/(59-76) 111/76 mmHg (01/31 1404) SpO2:  [92 %-94 %] 93 % (01/31 1404)  Cardiovascular: Skin warm; not flushed Respiratory: Breaths quiet; no shortness of breath Abdomen: No masses. Soft nontender. There is tenderness at the base of the penis over the pubic bone. The bladder is not distended. Neurological: Normal sensation to touch Musculoskeletal: Normal motor function arms and legs Lymphatics: No inguinal adenopathy Skin: No rashes Genitourinary: Penis is circumcised. Meatus is normal. Scrotum is normal in appearance. Both testicles are normal. Cords and epididymis are within normal limits. Rectal examination is deferred.  Laboratory Data:  Results for orders placed during the hospital encounter of 09/19/11 (from the past 72 hour(s))  URINALYSIS, ROUTINE W REFLEX MICROSCOPIC     Status: Normal   Collection Time   09/27/11  3:42 PM      Component Value Range Comment   Color, Urine YELLOW  YELLOW     APPearance CLEAR  CLEAR     Specific Gravity, Urine 1.006  1.005 - 1.030     pH 7.0  5.0 - 8.0     Glucose, UA NEGATIVE  NEGATIVE (mg/dL)    Hgb urine dipstick NEGATIVE  NEGATIVE     Bilirubin Urine NEGATIVE  NEGATIVE     Ketones, ur NEGATIVE  NEGATIVE (mg/dL)    Protein, ur NEGATIVE  NEGATIVE (mg/dL)    Urobilinogen, UA 0.2  0.0 - 1.0 (mg/dL)    Nitrite NEGATIVE  NEGATIVE     Leukocytes,  UA NEGATIVE  NEGATIVE  MICROSCOPIC NOT DONE ON URINES WITH NEGATIVE PROTEIN, BLOOD, LEUKOCYTES, NITRITE, OR GLUCOSE <1000 mg/dL.  CBC     Status: Abnormal   Collection Time   09/28/11  5:20 AM      Component Value Range Comment   WBC 7.4  4.0 - 10.5 (K/uL)    RBC 4.35  4.22 - 5.81 (MIL/uL)    Hemoglobin 12.8 (*) 13.0 - 17.0 (g/dL)    HCT 16.1  09.6 - 04.5 (%)    MCV 90.1  78.0 - 100.0 (fL)    MCH 29.4  26.0 - 34.0 (pg)    MCHC 32.7  30.0 - 36.0 (g/dL)    RDW 40.9  81.1 - 91.4 (%)    Platelets 238  150 - 400 (K/uL)   BASIC METABOLIC PANEL     Status: Abnormal   Collection Time    09/28/11  5:20 AM      Component Value Range Comment   Sodium 135  135 - 145 (mEq/L)    Potassium 4.3  3.5 - 5.1 (mEq/L)    Chloride 95 (*) 96 - 112 (mEq/L)    CO2 31  19 - 32 (mEq/L)    Glucose, Bld 100 (*) 70 - 99 (mg/dL)    BUN 13  6 - 23 (mg/dL)    Creatinine, Ser 7.82  0.50 - 1.35 (mg/dL)    Calcium 9.4  8.4 - 10.5 (mg/dL)    GFR calc non Af Amer 82 (*) >90 (mL/min)    GFR calc Af Amer >90  >90 (mL/min)   CBC     Status: Abnormal   Collection Time   09/29/11  5:10 AM      Component Value Range Comment   WBC 7.6  4.0 - 10.5 (K/uL)    RBC 4.37  4.22 - 5.81 (MIL/uL)    Hemoglobin 13.0  13.0 - 17.0 (g/dL)    HCT 95.6 (*) 21.3 - 52.0 (%)    MCV 88.6  78.0 - 100.0 (fL)    MCH 29.7  26.0 - 34.0 (pg)    MCHC 33.6  30.0 - 36.0 (g/dL)    RDW 08.6  57.8 - 46.9 (%)    Platelets 254  150 - 400 (K/uL)   BASIC METABOLIC PANEL     Status: Abnormal   Collection Time   09/29/11  5:10 AM      Component Value Range Comment   Sodium 135  135 - 145 (mEq/L)    Potassium 4.2  3.5 - 5.1 (mEq/L)    Chloride 96  96 - 112 (mEq/L)    CO2 28  19 - 32 (mEq/L)    Glucose, Bld 102 (*) 70 - 99 (mg/dL)    BUN 17  6 - 23 (mg/dL)    Creatinine, Ser 6.29  0.50 - 1.35 (mg/dL)    Calcium 9.4  8.4 - 10.5 (mg/dL)    GFR calc non Af Amer 84 (*) >90 (mL/min)    GFR calc Af Amer >90  >90 (mL/min)   HEPATIC FUNCTION PANEL     Status: Abnormal   Collection Time   09/29/11 12:11 PM      Component Value Range Comment   Total Protein 7.2  6.0 - 8.3 (g/dL)    Albumin 3.1 (*) 3.5 - 5.2 (g/dL)    AST 12  0 - 37 (U/L)    ALT 14  0 - 53 (U/L)    Alkaline Phosphatase 57  39 -  117 (U/L)    Total Bilirubin 0.3  0.3 - 1.2 (mg/dL)    Bilirubin, Direct <1.6  0.0 - 0.3 (mg/dL)    Indirect Bilirubin NOT CALCULATED  0.3 - 0.9 (mg/dL)    Recent Results (from the past 240 hour(s))  CULTURE, BLOOD (ROUTINE X 2)     Status: Normal   Collection Time   09/19/11  8:40 PM      Component Value Range Status Comment   Specimen  Description BLOOD LEFT ARM   Final    Special Requests BOTTLES DRAWN AEROBIC AND ANAEROBIC 10 CC EACH   Final    Culture  Setup Time [id number]   Final    Culture NO GROWTH 5 DAYS   Final    Report Status 09/26/2011 FINAL   Final    Creatinine:  Basename 09/29/11 0510 09/28/11 0520  CREATININE 1.07 1.09    Xrays: Patient had a CT scan with IV contrast on 08/01/2011 that revealed normal kidneys. CT scan on 09/19/2011 showed a 4 mm nonobstructing stone in the mid to upper pole of the right kidney. There is no evidence of hydronephrosis or ureteral calculi.    Impression/Assessment:  Right renal calculus. Abdominal pain.  Plan:  I doubt that the patient's pain is related to this small nonobstructing stone. Suggest: to strain his urine. We'll follow the patient with you.  Jarissa Sheriff-HENRY 09/29/2011, 8:01 PM

## 2011-09-30 ENCOUNTER — Inpatient Hospital Stay (HOSPITAL_COMMUNITY): Payer: Self-pay

## 2011-09-30 LAB — CBC
HCT: 37.8 % — ABNORMAL LOW (ref 39.0–52.0)
Hemoglobin: 12.6 g/dL — ABNORMAL LOW (ref 13.0–17.0)
MCHC: 33.3 g/dL (ref 30.0–36.0)
MCV: 89.4 fL (ref 78.0–100.0)
RDW: 13.3 % (ref 11.5–15.5)

## 2011-09-30 LAB — URINALYSIS, ROUTINE W REFLEX MICROSCOPIC
Glucose, UA: NEGATIVE mg/dL
Hgb urine dipstick: NEGATIVE
Leukocytes, UA: NEGATIVE
pH: 7.5 (ref 5.0–8.0)

## 2011-09-30 MED ORDER — SODIUM CHLORIDE 0.9 % IV SOLN
500.0000 mg | Freq: Four times a day (QID) | INTRAVENOUS | Status: DC
  Administered 2011-09-30 – 2011-10-07 (×28): 500 mg via INTRAVENOUS
  Filled 2011-09-30 (×31): qty 500

## 2011-09-30 MED ORDER — POLYETHYLENE GLYCOL 3350 17 G PO PACK
17.0000 g | PACK | Freq: Two times a day (BID) | ORAL | Status: DC
  Administered 2011-09-30 – 2011-10-05 (×9): 17 g via ORAL
  Filled 2011-09-30 (×16): qty 1

## 2011-09-30 MED ORDER — VANCOMYCIN HCL 1000 MG IV SOLR
750.0000 mg | Freq: Three times a day (TID) | INTRAVENOUS | Status: DC
  Administered 2011-09-30 – 2011-10-07 (×20): 750 mg via INTRAVENOUS
  Filled 2011-09-30 (×27): qty 750

## 2011-09-30 MED ORDER — IOHEXOL 300 MG/ML  SOLN: 100.0000 mL | Freq: Once | INTRAMUSCULAR | Status: AC | PRN

## 2011-09-30 MED ORDER — FLEET ENEMA 7-19 GM/118ML RE ENEM
1.0000 | ENEMA | Freq: Every day | RECTAL | Status: AC
  Administered 2011-10-01: 1 via RECTAL
  Filled 2011-09-30 (×2): qty 1

## 2011-09-30 NOTE — Progress Notes (Signed)
ANTIBIOTIC CONSULT NOTE - INITIAL  Pharmacy Consult for Vanco and Primaxin Indication: rule out pneumonia  Allergies  Allergen Reactions  . Penicillins Rash    Patient Measurements: Height: 5\' 10"  (177.8 cm) Weight: 175 lb 14.8 oz (79.8 kg) IBW/kg (Calculated) : 73   Vital Signs: Temp: 101.3 F (38.5 C) (02/01 1110) Temp src: Oral (02/01 0611) BP: 115/68 mmHg (02/01 0611) Pulse Rate: 80  (02/01 0611) Intake/Output from previous day: 01/31 0701 - 02/01 0700 In: 1440 [P.O.:1440] Out: 351 [Urine:350; Stool:1] Intake/Output from this shift: Total I/O In: 480 [P.O.:480] Out: 850 [Urine:850]  Labs:  [facility] 09/30/11 0445 09/29/11 0510 09/28/11 0520  WBC 8.3 7.6 7.4  HGB 12.6* 13.0 12.8*  PLT 251 254 238  LABCREA -- -- --  CREATININE -- 1.07 1.09   Estimated Creatinine Clearance: 92.9 ml/min (by C-G formula based on Cr of 1.07). CrCl(n) ~ 91 ml/min No results found for this basename: VANCOTROUGH:2,VANCOPEAK:2,VANCORANDOM:2,GENTTROUGH:2,GENTPEAK:2,GENTRANDOM:2,TOBRATROUGH:2,TOBRAPEAK:2,TOBRARND:2,AMIKACINPEAK:2,AMIKACINTROU:2,AMIKACIN:2, in the last 72 hours   Microbiology: Recent Results (from the past 720 hour(s))  URINE CULTURE     Status: Normal   Collection Time   09/19/11  6:36 PM      Component Value Range Status Comment   Specimen Description URINE, CATHETERIZED   Final    Special Requests NONE   Final    Culture  Setup Time [id number]   Final    Colony Count NO GROWTH   Final    Culture NO GROWTH   Final    Report Status 09/20/2011 FINAL   Final   CULTURE, BLOOD (ROUTINE X 2)     Status: Normal   Collection Time   09/19/11  7:38 PM      Component Value Range Status Comment   Specimen Description BLOOD LEFT AC   Final    Special Requests BOTTLES DRAWN AEROBIC AND ANAEROBIC 4 CC EACH   Final    Culture  Setup Time [id number]   Final    Culture NO GROWTH 5 DAYS   Final    Report Status 09/26/2011 FINAL   Final   CULTURE, BLOOD (ROUTINE X 2)     Status:  Normal   Collection Time   09/19/11  8:40 PM      Component Value Range Status Comment   Specimen Description BLOOD LEFT ARM   Final    Special Requests BOTTLES DRAWN AEROBIC AND ANAEROBIC 10 CC EACH   Final    Culture  Setup Time [id number]   Final    Culture NO GROWTH 5 DAYS   Final    Report Status 09/26/2011 FINAL   Final     Medical History: Past Medical History  Diagnosis Date  . Ulcerative colitis   . Meniere disease   . Bipolar 1 disorder, depressed   . Substance abuse     Medications:  Scheduled:    . citalopram  30 mg Oral Daily  . clonazePAM  1 mg Oral TID  . docusate sodium  100 mg Oral BID  . nicotine  21 mg Transdermal Daily  . polyethylene glycol  17 g Oral BID  . senna-docusate  2 tablet Oral Daily  . sodium phosphate  1 enema Rectal Daily  . DISCONTD: polyethylene glycol  17 g Oral Daily   Infusions:   PRN: acetaminophen, acetaminophen, alum & mag hydroxide-simeth, diphenhydrAMINE, iohexol, ondansetron, ondansetron (ZOFRAN) IV, ondansetron, oxyCODONE, senna-docusate Anti-infectives     Start     Dose/Rate Route Frequency Ordered Stop   09/21/11 1000  levofloxacin (LEVAQUIN) tablet 500 mg  Status:  Discontinued        500 mg Oral Daily 09/21/11 0854 09/26/11 0836   09/20/11 2000   levofloxacin (LEVAQUIN) IVPB 500 mg  Status:  Discontinued        500 mg 100 mL/hr over 60 Minutes Intravenous Every 24 hours 09/20/11 1900 09/21/11 0854         Assessment: 41 yo M with suspected HAP.  Goal of Therapy:  Vancomycin trough level 15-20 mcg/ml  Plan:  1) Vanco 750mg  IV q8h 2) Primaxin 500mg  IV q6h  Annia Belt 09/30/2011,5:02 PM

## 2011-09-30 NOTE — Progress Notes (Signed)
Per State's request, faxed Urology Consult.  Providence Crosby, LCSWA Clinical Social Work [phone]

## 2011-09-30 NOTE — Consult Note (Addendum)
Reason for Consult: Suicidal ideation Referring Physician: Dr. Agustina Caroli is an 41 y.o. male.  HPI: The patient came in with an overdose of Xanax and was dropped virtually at the ED [facility] by 'friend' suggesting he had been taking drugs.  His UDS was + for cocaine, opiates, cannabis.. He said he was tired of living his life, he did not want to try any more and just wanted to end it all. He declared as soon as he was discharged he would be trying to "check out". At the last interview he recalled the consult instead "I want to go to rehabilitation Center"  AXIS I   Depression NOS, Polysubstance dependence  AXIS II  Deferred  AXIS III Past Medical History  Diagnosis Date  . Ulcerative colitis   . Meniere disease   . Bipolar 1 disorder, depressed   . Substance abuse     History reviewed. No pertinent past surgical history. AXIS IV Parenting issues, employment, financial, housing,  AXIS V GAF 55 History reviewed. No pertinent family history.  Social History:  reports that he has been smoking.  He has never used smokeless tobacco. He reports that he uses illicit drugs (Marijuana and Cocaine). He reports that he does not drink alcohol.  Allergies:  Allergies  Allergen Reactions  . Penicillins Rash    Medications: I have reviewed the patient's current medications.  Results for orders placed during the hospital encounter of 09/19/11 (from the past 48 hour(s))  CBC     Status: Abnormal   Collection Time   09/29/11  5:10 AM      Component Value Range Comment   WBC 7.6  4.0 - 10.5 (K/uL)    RBC 4.37  4.22 - 5.81 (MIL/uL)    Hemoglobin 13.0  13.0 - 17.0 (g/dL)    HCT 53.6 (*) 64.4 - 52.0 (%)    MCV 88.6  78.0 - 100.0 (fL)    MCH 29.7  26.0 - 34.0 (pg)    MCHC 33.6  30.0 - 36.0 (g/dL)    RDW 03.4  74.2 - 59.5 (%)    Platelets 254  150 - 400 (K/uL)   BASIC METABOLIC PANEL     Status: Abnormal   Collection Time   09/29/11  5:10 AM      Component Value Range Comment   Sodium 135   135 - 145 (mEq/L)    Potassium 4.2  3.5 - 5.1 (mEq/L)    Chloride 96  96 - 112 (mEq/L)    CO2 28  19 - 32 (mEq/L)    Glucose, Bld 102 (*) 70 - 99 (mg/dL)    BUN 17  6 - 23 (mg/dL)    Creatinine, Ser 6.38  0.50 - 1.35 (mg/dL)    Calcium 9.4  8.4 - 10.5 (mg/dL)    GFR calc non Af Amer 84 (*) >90 (mL/min)    GFR calc Af Amer >90  >90 (mL/min)   HEPATIC FUNCTION PANEL     Status: Abnormal   Collection Time   09/29/11 12:11 PM      Component Value Range Comment   Total Protein 7.2  6.0 - 8.3 (g/dL)    Albumin 3.1 (*) 3.5 - 5.2 (g/dL)    AST 12  0 - 37 (U/L)    ALT 14  0 - 53 (U/L)    Alkaline Phosphatase 57  39 - 117 (U/L)    Total Bilirubin 0.3  0.3 - 1.2 (mg/dL)    Bilirubin,  Direct <0.1  0.0 - 0.3 (mg/dL)    Indirect Bilirubin NOT CALCULATED  0.3 - 0.9 (mg/dL)   HIV ANTIBODY (ROUTINE TESTING)     Status: Normal   Collection Time   09/29/11 12:11 PM      Component Value Range Comment   HIV NON REACTIVE  NON REACTIVE    HEPATITIS PANEL, ACUTE     Status: Normal (Preliminary result)   Collection Time   09/29/11 12:11 PM      Component Value Range Comment   Hepatitis B Surface Ag NEGATIVE  NEGATIVE     HCV Ab NEGATIVE  NEGATIVE     Hep A IgM PENDING  NEGATIVE     Hep B C IgM PENDING  NEGATIVE    IRON AND TIBC     Status: Abnormal   Collection Time   09/29/11 12:11 PM      Component Value Range Comment   Iron 47  42 - 135 (ug/dL)    TIBC 161  096 - 045 (ug/dL)    Saturation Ratios 18 (*) 20 - 55 (%)    UIBC 218  125 - 400 (ug/dL)   FERRITIN     Status: Normal   Collection Time   09/29/11 12:11 PM      Component Value Range Comment   Ferritin 204  22 - 322 (ng/mL)   CBC     Status: Abnormal   Collection Time   09/30/11  4:45 AM      Component Value Range Comment   WBC 8.3  4.0 - 10.5 (K/uL)    RBC 4.23  4.22 - 5.81 (MIL/uL)    Hemoglobin 12.6 (*) 13.0 - 17.0 (g/dL)    HCT 40.9 (*) 81.1 - 52.0 (%)    MCV 89.4  78.0 - 100.0 (fL)    MCH 29.8  26.0 - 34.0 (pg)    MCHC 33.3   30.0 - 36.0 (g/dL)    RDW 91.4  78.2 - 95.6 (%)    Platelets 251  150 - 400 (K/uL)   URINALYSIS, ROUTINE W REFLEX MICROSCOPIC     Status: Normal   Collection Time   09/30/11  2:30 PM      Component Value Range Comment   Color, Urine YELLOW  YELLOW     APPearance CLEAR  CLEAR     Specific Gravity, Urine 1.005  1.005 - 1.030     pH 7.5  5.0 - 8.0     Glucose, UA NEGATIVE  NEGATIVE (mg/dL)    Hgb urine dipstick NEGATIVE  NEGATIVE     Bilirubin Urine NEGATIVE  NEGATIVE     Ketones, ur NEGATIVE  NEGATIVE (mg/dL)    Protein, ur NEGATIVE  NEGATIVE (mg/dL)    Urobilinogen, UA 0.2  0.0 - 1.0 (mg/dL)    Nitrite NEGATIVE  NEGATIVE     Leukocytes, UA NEGATIVE  NEGATIVE  MICROSCOPIC NOT DONE ON URINES WITH NEGATIVE PROTEIN, BLOOD, LEUKOCYTES, NITRITE, OR GLUCOSE <1000 mg/dL.    Dg Chest 2 View  09/30/2011  *RADIOLOGY REPORT*  Clinical Data: Shortness of breath with chest pain and coughing for several weeks with yellow blood-tinged mucus.  No hypertension. Fever.  Nonsmoker  CHEST - 2 VIEW  Comparison: 09/19/2011  Findings: Heart and mediastinal contours are within normal limits. Persistent left basilar linear scarring or subsegmental atelectasis is seen.  There has been interval development of alveolar infiltrate involving the posterior and lateral segments of the left lower lobe and questionable in the posterior  right lower lobe. This finding is suspicious for areas of focal bronchopneumonia.  No pleural fluid or signs of congestive failure are seen.  Bony structures appear intact.  IMPRESSION: Findings suspicious for interval development of left lower lobe bronchopneumonia and probable right lower lobe pneumonia.  This result was called to the floor and reported to the patient's nurse, Ester at 2:55 pm.  Original Report Authenticated By: Bertha Stakes, M.D.   Ct Abdomen Pelvis W Contrast  09/30/2011  *RADIOLOGY REPORT*  Clinical Data: Abdominal pain and fever  CT ABDOMEN AND PELVIS WITH CONTRAST   Technique:  Multidetector CT imaging of the abdomen and pelvis was performed following the standard protocol during bolus administration of intravenous contrast.  Contrast:  100 ml Isovue 300  Comparison: 09/19/2011  Findings: Heterogeneous opacities have developed at both lung bases worrisome for airspace disease.  Liver, gallbladder, spleen, adrenal glands, pancreas are within normal limits.  Extensive amount of stool throughout the length of the colon.  Tiny hypodensities in the kidneys are stable.  Free fluid has nearly resolved.  There is a tiny amount of residual ascites in the pelvis.  Gas is noted in the bladder right the from recent catheterization.  Unremarkable prostate.  Normal appendix.  IMPRESSION: Extensive stool.  Bibasilar patchy airspace disease.  Original Report Authenticated By: Donavan Burnet, M.D.    Review of Systems  Unable to perform ROS: other   Blood pressure 115/68, pulse 80, temperature 98.4 F (36.9 C), temperature source Oral, resp. rate 20, height 5\' 10"  (1.778 m), weight 79.8 kg (175 lb 14.8 oz), SpO2 93.00%. Physical Exam  Assessment/Plan: Chart is reviewed, Pt is interviewed for persistent suicidal ideation This patient is sitting up and has just concluded the session with a chaplain. He is neat calm with intermittent eye contact and spontaneous speech. He is question about his determined suicidal ideation. He spontaneously and seriously states that he is no longer a suicidal. He said that he and a chaplain have been in prayer for the past 3 days and it has helped to realize that his young sons (2) need him and he has the responsibility because his parents may not be able to always take care of them. He states that he would like to be admitted to a rehabilitation center and he has contacted [facility]. They said he would have to interview with them at their facility once he is discharged. He is question about his interest in rehabilitation since he has been insisting that  he only uses medication as directed. He admits to some use of drugs but states his drug of choice is vodka. Discussion of the high potential of respiratory suppression due to a combination of liquor and benzodiazepines results in his admission that he understands the risk. He minimizes the use of recreational drugs.  This patient speaks clearly and thoughtfully about his hospital admission and claims that he is thinking more clearly  about his options.he strongly denies that he has any suicidal/homicidal intent. He further claims that he would remain safe without any thought of attempting suicide. in his hospital room.  RECOMMENDATION: 1. This patient has the capacity and is cognitively intact to state that he is no longer feeling suicidal. 2. Suggest the IVC and sitter be discontinued.  3. Suggest transfer patient to a rehabilitation inpatient unit  Quincey Nored 09/30/2011, 7:13 PM

## 2011-09-30 NOTE — Progress Notes (Signed)
Notified by Junious Dresser at [facility] that Pt has officially been placed back on the waiting list.  CSW to continue to follow.  Providence Crosby, LCSWA Clinical Social Work [phone]

## 2011-09-30 NOTE — Progress Notes (Signed)
Ian Ruiz spoke with Gigi Gin at [facility] and was informed that Pt isn't eligible for benefits because he hasn't been seen at a [place] facility in the past 24 months.  Gigi Gin can be reached at: [phone].  CSW to continue to follow.  Providence Crosby, LCSWA Clinical Social Work [phone]

## 2011-09-30 NOTE — Progress Notes (Addendum)
Subjective:. Complaining of pain in LLQ, spiked temp this AM.  Objective: Vital signs in last 24 hours: Temp:  [97.9 F (36.6 C)-101.3 F (38.5 C)] 101.3 F (38.5 C) (02/01 1110) Pulse Rate:  [80-83] 80  (02/01 0611) Resp:  [18-20] 20  (02/01 0611) BP: (111-115)/(68-76) 115/68 mmHg (02/01 0611) SpO2:  [93 %] 93 % (02/01 9562) Weight change:  Last BM Date: 09/29/11  Intake/Output from previous day: 01/31 0701 - 02/01 0700 In: 1440 [P.O.:1440] Out: 351 [Urine:350; Stool:1] Total I/O In: -  Out: 500 [Urine:500]   Physical Exam: General: Communicative, fully oriented, not short of breath at rest.  HEENT:  No clinical pallor, no jaundice, no conjunctival injection or discharge. Hydration status is OK. NECK:  Supple, JVP not seen, no carotid bruits, no palpable lymphadenopathy, no palpable goiter. CHEST:  Clinically clear to auscultation, no wheezes, no crackles. HEART:  Sounds 1 and 2 heard, normal, regular, no murmurs. ABDOMEN:  Full, soft, mild-moderate tenderness in LLQ and supraumbilically, no palpable organomegaly, no palpable masses, normal bowel sounds. GENITALIA:  Not examined. LOWER EXTREMITIES:  No pitting edema, palpable peripheral pulses. MUSCULOSKELETAL SYSTEM:  Unremarkable. CENTRAL NERVOUS SYSTEM:  No focal neurologic deficit on gross examination.  Lab Results:  Basename 09/30/11 0445 09/29/11 0510  WBC 8.3 7.6  HGB 12.6* 13.0  HCT 37.8* 38.7*  PLT 251 254    Basename 09/29/11 0510 09/28/11 0520  NA 135 135  K 4.2 4.3  CL 96 95*  CO2 28 31  GLUCOSE 102* 100*  BUN 17 13  CREATININE 1.07 1.09  CALCIUM 9.4 9.4   No results found for this or any previous visit (from the past 240 hour(s)).   Studies/Results: No results found.  Medications: Scheduled Meds:    . citalopram  30 mg Oral Daily  . clonazePAM  1 mg Oral TID  . docusate sodium  100 mg Oral BID  . nicotine  21 mg Transdermal Daily  . polyethylene glycol  17 g Oral BID  .  senna-docusate  2 tablet Oral Daily  . DISCONTD: polyethylene glycol  17 g Oral Daily   Continuous Infusions:  PRN Meds:.acetaminophen, acetaminophen, alum & mag hydroxide-simeth, diphenhydrAMINE, ondansetron, ondansetron (ZOFRAN) IV, ondansetron, oxyCODONE, senna-docusate  Assessment/Plan: Principal Problem:  *Abdominal pain, acute, bilateral lower quadrant  Patient continues to complain of recurrent lower abdominal/suprapubic pain and seeking more narcotics for control of pain. Patient is s/p Abdo ct scan on 09/19/11, which showed nonobstructing 4 mm right renal calculus. In the last few days, he has spiked temperature a few times, and passed a stone on 09/27/11, which was reportedly sent for analysis. Patient just completed a 7 day course of Levaquin on 09/21/11. Seen by Dr Brunilda Payor, Urology, on 09/29/11. Input much appreciated. Meanwhile, on Oxycodone 30 m prn q4 hours, for pain. Active Problems:  1. ARF (acute renal failure): Likely prerenal, secondary to dehydration. This has now resolved, with iv fluids.  2. Leukocytosis: Likely secondary to UTI. Now resolved. Urinalysis is negative at this time.  3. Suicidal ideation: [facility] at state institution  4. Bipolar disorder (manic depression)  On Citalopram, per psychiatrist. Patient was re-evaluated by Dr Ferol Luz on 09/28/11, and recommendations for inpatient psychiatric treatment, remain unchanged. 5. Fever: Patient has had a relapse of pyrexia this morning, wcc is normal, as was urinalysis of 09/27/11. Source is unclear, and no respiratory tract symptoms are evident. I am concerned about abdominal pain/tenderness, and will do septic work-up, including blood cultures, U/A, CXR and CT  of abdomen and pelvis. Perhaps, ID consult would be helpful.    LOS: 11 days   Ian Ruiz,Ian Ruiz 09/30/2011, 11:29 AM   Addendum: 4.31 PM. CXR and Abdo/Pelvic CT findings noted. Patient appears to have healthcare associated pneumonia, as well as severe  constipation and fecal loading. Will treat with iv Vancomycin/Primaxin. Enemas, for constipation.  Ian Capuchin MD.

## 2011-09-30 NOTE — Progress Notes (Signed)
T:  101.3   BP:  115/68    P:  80    R: 20. Still complaining of right lower quadrant and infrapubic pain associated with nausea. Abdomen: soft, tender in the right lower quadrant with rebound tenderness. There is also tenderness in the infrapubic area. Testicles and epididymides are normal. Hgb: 12.8  Hct: 39.2   WBC: 7.4 BUN 13,  Creatinine 1.09  Need to rule out appendicitis.  Is scheduled for CT scan abdomen and pelvis as per Dr Brien Few.

## 2011-09-30 NOTE — Progress Notes (Signed)
Follow up with pt.  Continued emotional support and work around goal clarification.   Pt reported that SW is looking into [place] benefits, but is not aware yet that he may not have VA benefits.    Pt asked chaplain to pray with him about upcoming steps.

## 2011-09-30 NOTE — Progress Notes (Signed)
Reviewed chart and, per the Chaplain's note, Pt may have VA benefits.  LM for [employer], Hospital doctor at [place], [phone], seeking assistance from her in determining if Pt does, in fact, have VA benefits.  CSW to continue to follow.  Providence Crosby, LCSWA Clinical Social Work [phone]

## 2011-09-30 NOTE — Progress Notes (Signed)
Faxed HIV, Hep Serology and Iron Panel to [facility] at [place].  Spoke with Deanna Artis re: VA benefits.  Per Deanna Artis, no VA benefits found in the system.  Deanna Artis is to send a note to [place] to get clarification.  Deanna Artis will notify CSW whether Pt has VA benefits.  Providence Crosby, LCSWA Clinical Social Work [phone]

## 2011-09-30 NOTE — Progress Notes (Signed)
   CARE MANAGEMENT NOTE 09/30/2011  Patient:  Ian Ruiz, Ian Ruiz   Account Number:  [id number]  Date Initiated:  09/20/2011  Documentation initiated by:  Lanier Clam  Subjective/Objective Assessment:   ADMITTED W/ABD PAIN.IN ROUTE TO [facility] FOR ADMISSION-SUICIDAL IDEATION,& DRUG ABUSE.ZO:XWRUEAV/[id number] ABUSE.     Action/Plan:   PSYCH/INPT REHAB @ D/C.   Anticipated DC Date:  10/05/2011   Anticipated DC Plan:  PSYCHIATRIC HOSPITAL  In-house referral  Clinical Social Worker      DC Planning Services  Medication Assistance      Choice offered to / List presented to:             Status of service:  In process, will continue to follow Medicare Important Message given?   (If response is "NO", the following Medicare IM given date fields will be blank) Date Medicare IM given:   Date Additional Medicare IM given:    Discharge Disposition:    Per UR Regulation:  Reviewed for med. necessity/level of care/duration of stay  Comments:  09/30/11 Lynnell Fiumara RN,BSN NCM [phone] NOTED T-101.3,LLQ ABD PAIN,URINE STRAINED,PASSED STONE.UROLOGY FOLLOWING.PSYCH SW FOLLOWING FOR STATE AUTH FOR INPT PSYCH,NOTED ON WAIT LIST.  09/27/11 Halo Laski RN,BSN NCM [phone] MED STABLE.AWAITING AUTH FROM STATE FOR INPT PSYCH FACILITY.PSYCH SW FOLLOWING DILIGENTLY. 09/23/11 Zulma Court RN,BSN NCM [phone] CONTINUE TO FOLLOW FOR PROGRESS,& ASST W/D/C NEEDS.PSYCH/PSYCH SW FOLLOWING. D/C PLAN INPT PSYCH.NOTED NOT [facility] APPROPRIATE.AWAITING AUTH FROM THE STATE.  09/20/11 Geanette Buonocore RN,BSN NCM [phone] QUALIFIES FOR INDIGENT FUNDS IF NEEDED.

## 2011-09-30 NOTE — Progress Notes (Signed)
Faxed Pt's information to [facility] for inpt consideration.  CSW to continue to follow.  Providence Crosby, LCSWA Clinical Social Work [phone]

## 2011-10-01 LAB — CBC
MCH: 29.5 pg (ref 26.0–34.0)
MCHC: 33.6 g/dL (ref 30.0–36.0)
MCV: 87.7 fL (ref 78.0–100.0)
Platelets: 266 10*3/uL (ref 150–400)
RDW: 13.3 % (ref 11.5–15.5)

## 2011-10-01 LAB — BASIC METABOLIC PANEL
Calcium: 8.8 mg/dL (ref 8.4–10.5)
Creatinine, Ser: 1.17 mg/dL (ref 0.50–1.35)
GFR calc Af Amer: 87 mL/min — ABNORMAL LOW (ref >90)
GFR calc non Af Amer: 75 mL/min — ABNORMAL LOW (ref >90)
Sodium: 132 mEq/L — ABNORMAL LOW (ref 135–145)

## 2011-10-01 MED ORDER — DIPHENHYDRAMINE HCL 50 MG/ML IJ SOLN
INTRAMUSCULAR | Status: AC
  Filled 2011-10-01: qty 1

## 2011-10-01 MED ORDER — HYDROMORPHONE HCL PF 1 MG/ML IJ SOLN
INTRAMUSCULAR | Status: AC
  Administered 2011-10-01: 2 mg
  Filled 2011-10-01: qty 2

## 2011-10-01 MED ORDER — OXYCODONE HCL 5 MG PO TABS
15.0000 mg | ORAL_TABLET | ORAL | Status: DC | PRN
  Administered 2011-10-01 – 2011-10-07 (×24): 15 mg via ORAL
  Filled 2011-10-01 (×27): qty 3

## 2011-10-01 MED ORDER — SODIUM CHLORIDE 0.9 % IV SOLN
INTRAVENOUS | Status: DC
  Administered 2011-10-01: 18:00:00 via INTRAVENOUS
  Administered 2011-10-02: 1000 mL via INTRAVENOUS
  Administered 2011-10-03 – 2011-10-05 (×3): via INTRAVENOUS

## 2011-10-01 MED ORDER — GUAIFENESIN ER 600 MG PO TB12
600.0000 mg | ORAL_TABLET | Freq: Two times a day (BID) | ORAL | Status: DC
  Administered 2011-10-01 – 2011-10-07 (×13): 600 mg via ORAL
  Filled 2011-10-01 (×14): qty 1

## 2011-10-01 NOTE — Progress Notes (Signed)
Subjective:. Continued to spike temp ovvernight. Has non-productive cough.  Objective: Vital signs in last 24 hours: Temp:  [98.4 F (36.9 C)-102.8 F (39.3 C)] 102.8 F (39.3 C) (02/02 0441) Pulse Rate:  [80-111] 111  (02/02 0441) Resp:  [19-24] 24  (02/02 0441) BP: (107-115)/(63-69) 115/69 mmHg (02/02 0441) SpO2:  [93 %] 93 % (02/01 2100) Weight:  [79.8 kg (175 lb 14.8 oz)-80.3 kg (177 lb 0.5 oz)] 80.3 kg (177 lb 0.5 oz) (02/02 0441) Weight change:  Last BM Date: 09/29/11  Intake/Output from previous day: 02/01 0701 - 02/02 0700 In: 480 [P.O.:480] Out: 1775 [Urine:1775] Total I/O In: 240 [P.O.:240] Out: -    Physical Exam: General: Communicative, fully oriented, not short of breath at rest.  HEENT:  No clinical pallor, no jaundice, no conjunctival injection or discharge. Hydration status is OK. NECK:  Supple, JVP not seen, no carotid bruits, no palpable lymphadenopathy, no palpable goiter. CHEST:  Few crackles left base, no wheeze. HEART:  Sounds 1 and 2 heard, normal, regular, no murmurs. ABDOMEN:  Full, soft, mild-moderate tenderness in LLQ and supraumbilically, no palpable organomegaly, no palpable masses, normal bowel sounds. GENITALIA:  Not examined. LOWER EXTREMITIES:  No pitting edema, palpable peripheral pulses. MUSCULOSKELETAL SYSTEM:  Unremarkable. CENTRAL NERVOUS SYSTEM:  No focal neurologic deficit on gross examination.  Lab Results:  Basename 10/01/11 0454 09/30/11 0445  WBC 14.2* 8.3  HGB 12.5* 12.6*  HCT 37.2* 37.8*  PLT 266 251    Basename 10/01/11 0454 09/29/11 0510  NA 132* 135  K 4.0 4.2  CL 96 96  CO2 25 28  GLUCOSE 114* 102*  BUN 16 17  CREATININE 1.17 1.07  CALCIUM 8.8 9.4   Recent Results (from the past 240 hour(s))  CULTURE, BLOOD (ROUTINE X 2)     Status: Normal (Preliminary result)   Collection Time   09/30/11 12:19 PM      Component Value Range Status Comment   Specimen Description BLOOD LEFT HAND   Final    Special Requests  BOTTLES DRAWN AEROBIC ONLY 4CC   Final    Culture  Setup Time [id number]   Final    Culture     Final    Value:        BLOOD CULTURE RECEIVED NO GROWTH TO DATE CULTURE WILL BE HELD FOR 5 DAYS BEFORE ISSUING A FINAL NEGATIVE REPORT   Report Status PENDING   Incomplete   CULTURE, BLOOD (ROUTINE X 2)     Status: Normal (Preliminary result)   Collection Time   09/30/11 12:20 PM      Component Value Range Status Comment   Specimen Description BLOOD LEFT HAND   Final    Special Requests BOTTLES DRAWN AEROBIC ONLY 4CC   Final    Culture  Setup Time [id number]   Final    Culture     Final    Value:        BLOOD CULTURE RECEIVED NO GROWTH TO DATE CULTURE WILL BE HELD FOR 5 DAYS BEFORE ISSUING A FINAL NEGATIVE REPORT   Report Status PENDING   Incomplete      Studies/Results: Dg Chest 2 View  09/30/2011  *RADIOLOGY REPORT*  Clinical Data: Shortness of breath with chest pain and coughing for several weeks with yellow blood-tinged mucus.  No hypertension. Fever.  Nonsmoker  CHEST - 2 VIEW  Comparison: 09/19/2011  Findings: Heart and mediastinal contours are within normal limits. Persistent left basilar linear scarring or subsegmental atelectasis is seen.  There  has been interval development of alveolar infiltrate involving the posterior and lateral segments of the left lower lobe and questionable in the posterior right lower lobe. This finding is suspicious for areas of focal bronchopneumonia.  No pleural fluid or signs of congestive failure are seen.  Bony structures appear intact.  IMPRESSION: Findings suspicious for interval development of left lower lobe bronchopneumonia and probable right lower lobe pneumonia.  This result was called to the floor and reported to the patient's nurse, Ester at 2:55 pm.  Original Report Authenticated By: Bertha Stakes, M.D.   Ct Abdomen Pelvis W Contrast  09/30/2011  *RADIOLOGY REPORT*  Clinical Data: Abdominal pain and fever  CT ABDOMEN AND PELVIS WITH CONTRAST   Technique:  Multidetector CT imaging of the abdomen and pelvis was performed following the standard protocol during bolus administration of intravenous contrast.  Contrast:  100 ml Isovue 300  Comparison: 09/19/2011  Findings: Heterogeneous opacities have developed at both lung bases worrisome for airspace disease.  Liver, gallbladder, spleen, adrenal glands, pancreas are within normal limits.  Extensive amount of stool throughout the length of the colon.  Tiny hypodensities in the kidneys are stable.  Free fluid has nearly resolved.  There is a tiny amount of residual ascites in the pelvis.  Gas is noted in the bladder right the from recent catheterization.  Unremarkable prostate.  Normal appendix.  IMPRESSION: Extensive stool.  Bibasilar patchy airspace disease.  Original Report Authenticated By: Donavan Burnet, M.D.    Medications: Scheduled Meds:    . citalopram  30 mg Oral Daily  . clonazePAM  1 mg Oral TID  . diphenhydrAMINE      . docusate sodium  100 mg Oral BID  . imipenem-cilastatin  500 mg Intravenous Q6H  . nicotine  21 mg Transdermal Daily  . polyethylene glycol  17 g Oral BID  . senna-docusate  2 tablet Oral Daily  . sodium phosphate  1 enema Rectal Daily  . vancomycin  750 mg Intravenous Q8H  . DISCONTD: polyethylene glycol  17 g Oral Daily   Continuous Infusions:  PRN Meds:.acetaminophen, acetaminophen, alum & mag hydroxide-simeth, diphenhydrAMINE, iohexol, ondansetron, ondansetron (ZOFRAN) IV, ondansetron, oxyCODONE, senna-docusate  Assessment/Plan: Principal Problem:  *Abdominal pain, acute, bilateral lower quadrant  Patient continues to complain of recurrent lower abdominal/suprapubic pain and seeking more narcotics for control of pain. Patient is s/p Abdo ct scan on 09/19/11, which showed nonobstructing 4 mm right renal calculus. In the last few days, he has spiked temperature a few times, and passed a stone on 09/27/11, which was reportedly sent for analysis. Patient just  completed a 7 day course of Levaquin on 09/21/11. Seen by Dr Brunilda Payor, Urology, on 09/29/11. Input much appreciated. No intervention indicated. CT abdomen/pelvis done 09/30/11, showed extensive amount of stool throughout the length of the colon. Have started enemas, continued Miralax, and reduced Oxycodone to 15 mg prn q4 hours, for pain. Active Problems:  1. ARF (acute renal failure): Likely prerenal, secondary to dehydration. This has now resolved, with iv fluids.  2. UTI: Now resolved, following 7 days treatment with Levaquin. Urinalysis of 2/1`/13, was negative.  3. Suicidal ideation: [facility]waiting [facility] at state institution.  4. Bipolar disorder (manic depression)  On Citalopram, per psychiatrist. Patient was re-evaluated by Dr Ferol Luz on 09/28/11, and recommendations for inpatient psychiatric treatment, remain unchanged. 5. HAP: Patient has had a relapse of pyrexia on morning of 09/30/11, since then, temperature has ben between 101-102, and wcc is elevated today. CXR of 09/30/11,  showed findings suspicious for interval development of left lower lobe bronchopneumonia and probable right lower lobe pneumonia. This was confirmed by CT scan findings of  , as was urinalysis of 09/27/11. Source is unclear, and no respiratory tract symptoms are evident. I am concerned about abdominal pain/tenderness, and will do septic work-up, including blood cultures, U/A, CXR and CT of bibasilar patchy airspace disease. Patient was commenced on Vancomycin/Primaxin on 09/30/11, and is now on day# 2. Will add Mucinex, to treatment.    LOS: 12 days   Ian Ruiz,[name] 10/01/2011, 11:17 AM   Addendum: 4.31 PM. CXR and Abdo/Pelvic CT findings noted. Patient appears to have healthcare associated pneumonia, as well as severe constipation and fecal loading. Will treat with iv Vancomycin/Primaxin. Enemas, for constipation.  [name] MD.

## 2011-10-01 NOTE — Progress Notes (Signed)
Subjective: F/U Consult for fevers, stone.  Pt still febrile overnight with continued low pelvic / abd pain.  CT yesterday w/o hydro, obstructing stones, or GU abscess.  Voiding well. Having some continued nausea.  Objective: Vital signs in last 24 hours: Temp:  [98.4 F (36.9 C)-102.8 F (39.3 C)] 102.8 F (39.3 C) (02/02 0441) Pulse Rate:  [80-111] 111  (02/02 0441) Resp:  [19-24] 24  (02/02 0441) BP: (107-115)/(63-69) 115/69 mmHg (02/02 0441) SpO2:  [93 %] 93 % (02/01 2100) Weight:  [79.8 kg (175 lb 14.8 oz)-80.3 kg (177 lb 0.5 oz)] 80.3 kg (177 lb 0.5 oz) (02/02 0441) Last BM Date: 09/29/11  Intake/Output from previous day: 02/01 0701 - 02/02 0700 In: 480 [P.O.:480] Out: 1775 [Urine:1775] Intake/Output this shift: Total I/O In: -  Out: 925 [Urine:925]  General appearance: alert, cooperative and sitter at bedside Head: Normocephalic, without obvious abnormality, atraumatic Resp: clear to auscultation bilaterally Cardio: tachycardic with regular rythem Male genitalia: normal, penis: no lesions or discharge. testes: no masses or tenderness. no hernias Extremities: extremities normal, atraumatic, no cyanosis or edema and Homans sign is negative, no sign of DVT  Lab Results:   Basename 10/01/11 0454 09/30/11 0445  WBC 14.2* 8.3  HGB 12.5* 12.6*  HCT 37.2* 37.8*  PLT 266 251   BMET  Basename 10/01/11 0454 09/29/11 0510  NA 132* 135  K 4.0 4.2  CL 96 96  CO2 25 28  GLUCOSE 114* 102*  BUN 16 17  CREATININE 1.17 1.07  CALCIUM 8.8 9.4   PT/INR No results found for this basename: LABPROT:2,INR:2 in the last 72 hours ABG No results found for this basename: PHART:2,PCO2:2,PO2:2,HCO3:2 in the last 72 hours  Studies/Results: Dg Chest 2 View  09/30/2011  *RADIOLOGY REPORT*  Clinical Data: Shortness of breath with chest pain and coughing for several weeks with yellow blood-tinged mucus.  No hypertension. Fever.  Nonsmoker  CHEST - 2 VIEW  Comparison: 09/19/2011   Findings: Heart and mediastinal contours are within normal limits. Persistent left basilar linear scarring or subsegmental atelectasis is seen.  There has been interval development of alveolar infiltrate involving the posterior and lateral segments of the left lower lobe and questionable in the posterior right lower lobe. This finding is suspicious for areas of focal bronchopneumonia.  No pleural fluid or signs of congestive failure are seen.  Bony structures appear intact.  IMPRESSION: Findings suspicious for interval development of left lower lobe bronchopneumonia and probable right lower lobe pneumonia.  This result was called to the floor and reported to the patient's nurse, Ester at 2:55 pm.  Original Report Authenticated By: Bertha Stakes, M.D.   Ct Abdomen Pelvis W Contrast  09/30/2011  *RADIOLOGY REPORT*  Clinical Data: Abdominal pain and fever  CT ABDOMEN AND PELVIS WITH CONTRAST  Technique:  Multidetector CT imaging of the abdomen and pelvis was performed following the standard protocol during bolus administration of intravenous contrast.  Contrast:  100 ml Isovue 300  Comparison: 09/19/2011  Findings: Heterogeneous opacities have developed at both lung bases worrisome for airspace disease.  Liver, gallbladder, spleen, adrenal glands, pancreas are within normal limits.  Extensive amount of stool throughout the length of the colon.  Tiny hypodensities in the kidneys are stable.  Free fluid has nearly resolved.  There is a tiny amount of residual ascites in the pelvis.  Gas is noted in the bladder right the from recent catheterization.  Unremarkable prostate.  Normal appendix.  IMPRESSION: Extensive stool.  Bibasilar patchy airspace  disease.  Original Report Authenticated By: Donavan Burnet, M.D.    Anti-infectives: Anti-infectives     Start     Dose/Rate Route Frequency Ordered Stop   09/30/11 1800   imipenem-cilastatin (PRIMAXIN) 500 mg in sodium chloride 0.9 % 100 mL IVPB        500 mg 200  mL/hr over 30 Minutes Intravenous 4 times per day 09/30/11 1708     09/30/11 1800   vancomycin (VANCOCIN) 750 mg in sodium chloride 0.9 % 150 mL IVPB        750 mg 150 mL/hr over 60 Minutes Intravenous Every 8 hours 09/30/11 1708     09/21/11 1000   levofloxacin (LEVAQUIN) tablet 500 mg  Status:  Discontinued        500 mg Oral Daily 09/21/11 0854 09/26/11 0836   09/20/11 2000   levofloxacin (LEVAQUIN) IVPB 500 mg  Status:  Discontinued        500 mg 100 mL/hr over 60 Minutes Intravenous Every 24 hours 09/20/11 1900 09/21/11 0854          Assessment/Plan:  1 - Fever - Imaging favors no GU surgically correctable cause.  Most recent UCX pending, though UA unimpressive. Agree with current emperic ABX. Although imaging unimpressive, exam still somewhat worrisome for appendicitis.     LOS: 12 days    Kalep Full 10/01/2011

## 2011-10-02 LAB — VANCOMYCIN, TROUGH: Vancomycin Tr: 18 ug/mL (ref 10.0–20.0)

## 2011-10-02 LAB — BASIC METABOLIC PANEL
Chloride: 99 mEq/L (ref 96–112)
Creatinine, Ser: 0.94 mg/dL (ref 0.50–1.35)
GFR calc Af Amer: >90 mL/min (ref >90)
Potassium: 3.9 mEq/L (ref 3.5–5.1)
Sodium: 133 mEq/L — ABNORMAL LOW (ref 135–145)

## 2011-10-02 LAB — CBC
MCV: 88.6 fL (ref 78.0–100.0)
Platelets: 226 10*3/uL (ref 150–400)
RBC: 3.94 MIL/uL — ABNORMAL LOW (ref 4.22–5.81)
RDW: 13.1 % (ref 11.5–15.5)
WBC: 5.6 10*3/uL (ref 4.0–10.5)

## 2011-10-02 LAB — LIPASE, BLOOD: Lipase: 21 U/L (ref 11–59)

## 2011-10-02 LAB — URINE CULTURE
Colony Count: NO GROWTH
Culture  Setup Time: 201302020115
Culture: NO GROWTH

## 2011-10-02 MED ORDER — PANTOPRAZOLE SODIUM 40 MG PO TBEC
40.0000 mg | DELAYED_RELEASE_TABLET | Freq: Every day | ORAL | Status: DC
  Administered 2011-10-02 – 2011-10-03 (×2): 40 mg via ORAL
  Filled 2011-10-02 (×2): qty 1

## 2011-10-02 NOTE — Progress Notes (Signed)
Pharmacy Consult:   Day 3 Vancomycin/Primaxin for HCAP  Vancomycin Trough = 18.0  (Goal 15-20) CrCl > 100 ml/min  Microbiology  2/1 Blood x2 - NGTD 2/1 Urine - NG Final  Vancomycin trough therapeutic.   Continue vancomycin 750 mg IV q8h.   Tatyanna Cronk, Loma Messing PharmD 7:05 PM 10/02/2011

## 2011-10-02 NOTE — Progress Notes (Signed)
Pt complain of burning in chest. Pt given Maalox. Pt states he wants me to call dr and get a one time order for Dilaudid. I explained his pain medication is due and I will give him that. He said he knows that isn't going to help. He also asked for a coke. I explained that a carbonated drink may exacerbate his symptoms I offered him ice water he said ok. As I was getting his pain medication he told the Tech he wanted the coke weather it made his pain worse or not. I brought him the pain medication and offered him a different drink like apple juice. He accepted. I provided teaching on increasing his activity to get his bowels to move as well as cough and deep breath and getting up and walking around other than just to the bathroom and back to bed  for his lung health. Following this teaching the sitter offered to walk with him two different times and he refused. The patient has been here aprox 13 days and has stayed laying in bed except for one time other than going to the bathroom. Pt stated his chest is still burning Explained plenty of fluids as well as some relaxation techniques and repositioning is what I could offer him until his next dose at 0630. Pt said he would just try to go to sleep then. Pt vitals 107/72 BP 79 pulse 92% room air 16 RR Also pt chews up his pain pills. His Mucinex was in the pill cup with his pain pills at 2200 and he chewed it up as well. Not sure if this added to the burning.

## 2011-10-02 NOTE — Progress Notes (Signed)
Subjective:. Still has non-productive cough. Also complains of epigastric/retrosternal burning discomfort, unresponsive to Maalox. No further pyrexia, recorded. Moving bowels.  Objective: Vital signs in last 24 hours: Temp:  [97.7 F (36.5 C)-99.7 F (37.6 C)] 99.3 F (37.4 C) (02/03 1532) Pulse Rate:  [74-79] 77  (02/03 1532) Resp:  [16-18] 18  (02/03 1532) BP: (102-117)/(58-75) 117/75 mmHg (02/03 1532) SpO2:  [91 %-94 %] 92 % (02/03 1532) Weight change:  Last BM Date: 10/02/11  Intake/Output from previous day: 02/02 0701 - 02/03 0700 In: 720 [P.O.:720] Out: 1920 [Urine:1920]     Physical Exam: General: Communicative, fully oriented, not short of breath at rest.  HEENT:  No clinical pallor, no jaundice, no conjunctival injection or discharge. Hydration status is OK. NECK:  Supple, JVP not seen, no carotid bruits, no palpable lymphadenopathy, no palpable goiter. CHEST:  Few crackles left base, no wheeze. HEART:  Sounds 1 and 2 heard, normal, regular, no murmurs. ABDOMEN:  Full, soft, mild-moderate tenderness in LLQ and supraumbilically, no palpable organomegaly, no palpable masses, normal bowel sounds. GENITALIA:  Not examined. LOWER EXTREMITIES:  No pitting edema, palpable peripheral pulses. MUSCULOSKELETAL SYSTEM:  Unremarkable. CENTRAL NERVOUS SYSTEM:  No focal neurologic deficit on gross examination.  Lab Results:  Basename 10/02/11 0558 10/01/11 0454  WBC 5.6 14.2*  HGB 11.1* 12.5*  HCT 34.9* 37.2*  PLT 226 266    Basename 10/02/11 0558 10/01/11 0454  NA 133* 132*  K 3.9 4.0  CL 99 96  CO2 26 25  GLUCOSE 112* 114*  BUN 13 16  CREATININE 0.94 1.17  CALCIUM 9.0 8.8   Recent Results (from the past 240 hour(s))  CULTURE, BLOOD (ROUTINE X 2)     Status: Normal (Preliminary result)   Collection Time   09/30/11 12:19 PM      Component Value Range Status Comment   Specimen Description BLOOD LEFT HAND   Final    Special Requests BOTTLES DRAWN AEROBIC ONLY 4CC    Final    Culture  Setup Time [id number]   Final    Culture     Final    Value:        BLOOD CULTURE RECEIVED NO GROWTH TO DATE CULTURE WILL BE HELD FOR 5 DAYS BEFORE ISSUING A FINAL NEGATIVE REPORT   Report Status PENDING   Incomplete   CULTURE, BLOOD (ROUTINE X 2)     Status: Normal (Preliminary result)   Collection Time   09/30/11 12:20 PM      Component Value Range Status Comment   Specimen Description BLOOD LEFT HAND   Final    Special Requests BOTTLES DRAWN AEROBIC ONLY 4CC   Final    Culture  Setup Time [id number]   Final    Culture     Final    Value:        BLOOD CULTURE RECEIVED NO GROWTH TO DATE CULTURE WILL BE HELD FOR 5 DAYS BEFORE ISSUING A FINAL NEGATIVE REPORT   Report Status PENDING   Incomplete   URINE CULTURE     Status: Normal   Collection Time   09/30/11  2:30 PM      Component Value Range Status Comment   Specimen Description URINE, CLEAN CATCH   Final    Special Requests NONE   Final    Culture  Setup Time [id number]   Final    Colony Count NO GROWTH   Final    Culture NO GROWTH   Final    Report Status  10/02/2011 FINAL   Final      Studies/Results: No results found.  Medications: Scheduled Meds:    . citalopram  30 mg Oral Daily  . clonazePAM  1 mg Oral TID  . diphenhydrAMINE      . docusate sodium  100 mg Oral BID  . guaiFENesin  600 mg Oral BID  . HYDROmorphone      . imipenem-cilastatin  500 mg Intravenous Q6H  . nicotine  21 mg Transdermal Daily  . pantoprazole  40 mg Oral Q1200  . polyethylene glycol  17 g Oral BID  . senna-docusate  2 tablet Oral Daily  . sodium phosphate  1 enema Rectal Daily  . vancomycin  750 mg Intravenous Q8H   Continuous Infusions:    . sodium chloride 100 mL/hr at 10/01/11 1810   PRN Meds:.acetaminophen, acetaminophen, alum & mag hydroxide-simeth, diphenhydrAMINE, ondansetron, ondansetron (ZOFRAN) IV, ondansetron, oxyCODONE, senna-docusate  Assessment/Plan: Principal Problem:  *Abdominal pain, acute,  bilateral lower quadrant  Patient continues to complain of recurrent lower abdominal/suprapubic pain and seeking more narcotics for control of pain. Patient is s/p Abdo ct scan on 09/19/11, which showed nonobstructing 4 mm right renal calculus. In the last few days, he has spiked temperature a few times, and passed a stone on 09/27/11, which was reportedly sent for analysis. Patient just completed a 7 day course of Levaquin on 09/21/11. Seen by Dr Brunilda Payor, Urology, on 09/29/11. Input much appreciated. No intervention indicated. CT abdomen/pelvis done 09/30/11, showed extensive amount of stool throughout the length of the colon. Have started enemas, continued Miralax, and reduced Oxycodone to 15 mg prn q4 hours, for pain. Bowels have moved well, since yesterday, and abdominal discomfort/pain, is significantly improved. Active Problems:  1. ARF (acute renal failure): Likely prerenal, secondary to dehydration. This has now resolved, with iv fluids.  2. UTI: Now resolved, following 7 days treatment with Levaquin. Urinalysis of 09/30/11, was negative.  3. Suicidal ideation: Awaiting placement at state institution.  4. Bipolar disorder (manic depression)  On Citalopram, per psychiatrist. Patient was re-evaluated by Dr Ferol Luz on 09/28/11, and recommendations for inpatient psychiatric treatment, remain unchanged. 5. HAP: Patient had pyrexia on morning of 09/30/11, since then, temperature had been between 101-102, and wcc is elevated at 14.2. CXR of 09/30/11, showed findings suspicious for interval development of left lower lobe bronchopneumonia and probable right lower lobe pneumonia. This was confirmed by CT scan findings of the same date, which showed bibasilar patchy airspace disease. Patient was commenced on Vancomycin/Primaxin on 09/30/11, and is now on day# 3. Mucinex was added  to treatment. Patient has remained apyrexial overnight, and wcc has normalized at 5.6 on 10/02/11. 6. GERD: Patient complains of epigastric/retrosternal  burning discomfort. The symptoms are consistent with GERD. He has been started on PPI.    LOS: 13 days   Deke Tilghman,[name] 10/02/2011, 3:34 PM

## 2011-10-02 NOTE — Consult Note (Signed)
Follow-up on Mr Prime, familiar to me.   He will not be allowed to be on any opiates or any benzo when he goes for his interview at [facility].  Strong consideration could be given to using Elavil, Mobic, and Motrin for any pain.  Most opiate addicts report that their pain is much more easily managed with just Motrin after they are off the opiates.  In my experience in treating patients with Suboxone for over a year, they all experience pain more acutely. Some of this patients pain may be coming from how the ongoing use of opiates have impacted the central mu receptors and make them feel pain "louder" than it actually is.  Our pharmacist at [facility] has advised [place] on how to do this and has seen some considerable pain relief benefit with such a shift away from the opiates.  He could be  s t o p p e d  from the Klonopin and placed on TEGRETOL 200 mg TID for 10-14 days to cover the withdrawal seizure aspect AND start THORAZINE for anxiety at 10-25 mg TID-QID.  Considerable number of folks report that they get better anxiety relief from Thorazine than any of the benzos, they report clearer thinking and can remember things much better.    Thank you for the opportunity to share in the care of this patient. Dan Humphreys, Torianna Junio 10/02/2011 12:36 PM

## 2011-10-03 ENCOUNTER — Inpatient Hospital Stay (HOSPITAL_COMMUNITY): Payer: Self-pay

## 2011-10-03 LAB — HEPATITIS PANEL, ACUTE
HCV Ab: NEGATIVE
Hep A IgM: NEGATIVE
Hepatitis B Surface Ag: NEGATIVE

## 2011-10-03 MED ORDER — MORPHINE SULFATE 4 MG/ML IJ SOLN
3.0000 mg | Freq: Every evening | INTRAMUSCULAR | Status: DC | PRN
  Administered 2011-10-03: 3 mg via INTRAVENOUS
  Filled 2011-10-03: qty 1

## 2011-10-03 MED ORDER — ENSURE CLINICAL ST REVIGOR PO LIQD: 237.0000 mL | Freq: Two times a day (BID) | ORAL | Status: DC

## 2011-10-03 MED ORDER — MORPHINE SULFATE 4 MG/ML IJ SOLN
3.0000 mg | Freq: Once | INTRAMUSCULAR | Status: AC
  Administered 2011-10-03: 3 mg via INTRAVENOUS
  Filled 2011-10-03: qty 1

## 2011-10-03 MED ORDER — IBUPROFEN 600 MG PO TABS
600.0000 mg | ORAL_TABLET | Freq: Three times a day (TID) | ORAL | Status: DC
  Administered 2011-10-03 – 2011-10-06 (×10): 600 mg via ORAL
  Filled 2011-10-03 (×14): qty 1

## 2011-10-03 MED ORDER — PANTOPRAZOLE SODIUM 40 MG PO TBEC
40.0000 mg | DELAYED_RELEASE_TABLET | Freq: Two times a day (BID) | ORAL | Status: DC
  Administered 2011-10-03 – 2011-10-06 (×6): 40 mg via ORAL
  Filled 2011-10-03 (×10): qty 1

## 2011-10-03 MED ORDER — GUAIFENESIN-DM 100-10 MG/5ML PO SYRP: 5.0000 mL | ORAL_SOLUTION | ORAL | Status: DC | PRN

## 2011-10-03 MED ORDER — IOHEXOL 300 MG/ML  SOLN
80.0000 mL | Freq: Once | INTRAMUSCULAR | Status: AC | PRN
  Administered 2011-10-03: 80 mL via INTRAVENOUS

## 2011-10-03 NOTE — Plan of Care (Signed)
Problem: Phase I Progression Outcomes Goal: Initial discharge plan identified Outcome: Progressing Waiting on placement

## 2011-10-03 NOTE — Progress Notes (Signed)
Subjective:. Still has non-productive cough. Moving bowels.  Objective: Vital signs in last 24 hours: Temp:  [98.8 F (37.1 C)-99.3 F (37.4 C)] 98.8 F (37.1 C) (02/04 0600) Pulse Rate:  [71-77] 72  (02/04 0600) Resp:  [16-18] 18  (02/04 0600) BP: (107-119)/(69-75) 107/69 mmHg (02/04 0600) SpO2:  [92 %-95 %] 95 % (02/04 0600) Weight:  [81.5 kg (179 lb 10.8 oz)] 81.5 kg (179 lb 10.8 oz) (02/04 0600) Weight change:  Last BM Date: 10/02/11  Intake/Output from previous day: 02/03 0701 - 02/04 0700 In: 2270 [P.O.:720; I.V.:1200; IV Piggyback:350] Out: -      Physical Exam: General: Communicative, fully oriented, not short of breath at rest, feels unwell.  HEENT:  No clinical pallor, no jaundice, no conjunctival injection or discharge. Hydration status is OK. NECK:  Supple, JVP not seen, no carotid bruits, no palpable lymphadenopathy, no palpable goiter. CHEST:  Few crackles both bases, no wheeze. HEART:  Sounds 1 and 2 heard, normal, regular, no murmurs. ABDOMEN:  Full, soft, mild-moderate tenderness in LLQ and supraumbilically, no palpable organomegaly, no palpable masses, normal bowel sounds. GENITALIA:  Not examined. LOWER EXTREMITIES:  No pitting edema, palpable peripheral pulses. MUSCULOSKELETAL SYSTEM:  Unremarkable. CENTRAL NERVOUS SYSTEM:  No focal neurologic deficit on gross examination.  Lab Results:  Basename 10/02/11 0558 10/01/11 0454  WBC 5.6 14.2*  HGB 11.1* 12.5*  HCT 34.9* 37.2*  PLT 226 266    Basename 10/02/11 0558 10/01/11 0454  NA 133* 132*  K 3.9 4.0  CL 99 96  CO2 26 25  GLUCOSE 112* 114*  BUN 13 16  CREATININE 0.94 1.17  CALCIUM 9.0 8.8   Recent Results (from the past 240 hour(s))  CULTURE, BLOOD (ROUTINE X 2)     Status: Normal (Preliminary result)   Collection Time   09/30/11 12:19 PM      Component Value Range Status Comment   Specimen Description BLOOD LEFT HAND   Final    Special Requests BOTTLES DRAWN AEROBIC ONLY 4CC   Final    Culture  Setup Time [id number]   Final    Culture     Final    Value:        BLOOD CULTURE RECEIVED NO GROWTH TO DATE CULTURE WILL BE HELD FOR 5 DAYS BEFORE ISSUING A FINAL NEGATIVE REPORT   Report Status PENDING   Incomplete   CULTURE, BLOOD (ROUTINE X 2)     Status: Normal (Preliminary result)   Collection Time   09/30/11 12:20 PM      Component Value Range Status Comment   Specimen Description BLOOD LEFT HAND   Final    Special Requests BOTTLES DRAWN AEROBIC ONLY 4CC   Final    Culture  Setup Time [id number]   Final    Culture     Final    Value:        BLOOD CULTURE RECEIVED NO GROWTH TO DATE CULTURE WILL BE HELD FOR 5 DAYS BEFORE ISSUING A FINAL NEGATIVE REPORT   Report Status PENDING   Incomplete   URINE CULTURE     Status: Normal   Collection Time   09/30/11  2:30 PM      Component Value Range Status Comment   Specimen Description URINE, CLEAN CATCH   Final    Special Requests NONE   Final    Culture  Setup Time [id number]   Final    Colony Count NO GROWTH   Final    Culture NO GROWTH  Final    Report Status 10/02/2011 FINAL   Final      Studies/Results: Dg Chest 2 View  10/03/2011  *RADIOLOGY REPORT*  Clinical Data: Recently diagnosed with pneumonia, now with shortness of breath, coughing and chest discomfort  CHEST - 2 VIEW  Comparison: 09/30/2011; 09/19/2011; CT abdomen pelvis - 09/30/2011  Findings:  Grossly unchanged cardiac silhouette and mediastinal contours. The previously identified bilateral lower lung and left mid lung heterogeneous air space opacities are grossly unchanged compared to recent prior examination.  Interval development of an additional ill-defined airspace opacity within the right mid lung. Nodular opacities overlying the bilateral lower lung favored to represent nipple shadows.  No pleural effusion or pneumothorax.  Unchanged bones.  IMPRESSION: Findings most suggestive of progressive multifocal infection.  A follow-up chest radiograph in 4 to 6 weeks  after treatment is recommended to ensure resolution.  Original Report Authenticated By: Waynard Reeds, M.D.    Medications: Scheduled Meds:    . citalopram  30 mg Oral Daily  . clonazePAM  1 mg Oral TID  . docusate sodium  100 mg Oral BID  . guaiFENesin  600 mg Oral BID  . imipenem-cilastatin  500 mg Intravenous Q6H  . nicotine  21 mg Transdermal Daily  . pantoprazole  40 mg Oral Q1200  . polyethylene glycol  17 g Oral BID  . senna-docusate  2 tablet Oral Daily  . sodium phosphate  1 enema Rectal Daily  . vancomycin  750 mg Intravenous Q8H   Continuous Infusions:    . sodium chloride 1,000 mL (10/02/11 2308)   PRN Meds:.acetaminophen, acetaminophen, alum & mag hydroxide-simeth, diphenhydrAMINE, ondansetron, ondansetron (ZOFRAN) IV, ondansetron, oxyCODONE, senna-docusate  Assessment/Plan: Principal Problem:  *Abdominal pain, acute, bilateral lower quadrant  Patient is s/p Abdo ct scan on 09/19/11, which showed nonobstructing 4 mm right renal calculus. In the last few days, he has spiked temperature a few times, and passed a stone on 09/27/11, which was reportedly sent for analysis. Patient just completed a 7 day course of Levaquin on 09/21/11. Seen by Dr Brunilda Payor, Urology, on 09/29/11. Input much appreciated. No intervention indicated. CT abdomen/pelvis done 09/30/11, showed extensive amount of stool throughout the length of the colon. Have started enemas, continued Miralax, and reduced Oxycodone to 15 mg prn q4 hours, for pain. Bowels have moved well, since 10/01/11, and abdominal discomfort/pain, has significantly improved. Active Problems:  1. ARF (acute renal failure): Likely prerenal, secondary to dehydration. This has now resolved, with iv fluids.  2. UTI: Now resolved, following 7 days treatment with Levaquin. Urinalysis of 09/30/11, was negative.  3. Suicidal ideation: [facility].  4. Bipolar disorder (manic depression)  On Citalopram, per psychiatrist.  Patient was re-evaluated by Dr Ferol Luz on 09/28/11, and recommendations for inpatient psychiatric treatment, remain unchanged. 5. HAP: Patient had pyrexia on morning of 09/30/11, since then, temperature had been between 101-102, and wcc is elevated at 14.2. CXR of 09/30/11, showed findings suspicious for interval development of left lower lobe bronchopneumonia and probable right lower lobe pneumonia. This was confirmed by CT scan findings of the same date, which showed bibasilar patchy airspace disease. Patient was commenced on Vancomycin/Primaxin on 09/30/11, and is now on day# 4. CXR of 10/03/11, shows progression of airspace disease, although patient has remained apyrexial, and wcc has normalized. Patient continues to complain of chest pain. Will commence NSAID, and double PPI. As patient has not mobilized much during his hospitalization, will check ABG and do CTA, to  rule out PE, masquerading as pneumonia. . 6. GERD: Patient complains of epigastric/retrosternal burning discomfort. The symptoms are consistent with GERD. He was started on PPI, on 10/02/11..    LOS: 14 days   Ian Ruiz,[name] 10/03/2011, 9:43 AM

## 2011-10-03 NOTE — Progress Notes (Signed)
Attempted ABG, pt requested that I stop due to pain, sample was not obtained

## 2011-10-03 NOTE — Progress Notes (Signed)
Nutrition Follow-up  Diet Order: Regular with paper service r/t suicide precautions, intake 25-100% of meals over the weekend  - Pt denies any pain or nausea and states his appetite is improving today. DG of chest on 09/30/11 showed findings suspicious for interval development of left lower lobe  bronchopneumonia and probable right lower lobe pneumonia. CT of abdomen/pelvis on 09/30/11 showed extensive stool and bibasilar patchy airspace disease.    Meds: Scheduled Meds:   . citalopram  30 mg Oral Daily  . clonazePAM  1 mg Oral TID  . docusate sodium  100 mg Oral BID  . guaiFENesin  600 mg Oral BID  . ibuprofen  600 mg Oral TID  . imipenem-cilastatin  500 mg Intravenous Q6H  .  morphine injection  3 mg Intravenous Once  . nicotine  21 mg Transdermal Daily  . pantoprazole  40 mg Oral BID AC  . polyethylene glycol  17 g Oral BID  . senna-docusate  2 tablet Oral Daily  . sodium phosphate  1 enema Rectal Daily  . vancomycin  750 mg Intravenous Q8H  . DISCONTD: pantoprazole  40 mg Oral Q1200   Continuous Infusions:   . sodium chloride 100 mL/hr at 10/03/11 1139   PRN Meds:.acetaminophen, acetaminophen, alum & mag hydroxide-simeth, diphenhydrAMINE, guaiFENesin-dextromethorphan, iohexol, morphine injection, ondansetron, ondansetron (ZOFRAN) IV, ondansetron, oxyCODONE, senna-docusate  Labs:  CMP     Component Value Date/Time   NA 133* 10/02/2011 0558   K 3.9 10/02/2011 0558   CL 99 10/02/2011 0558   CO2 26 10/02/2011 0558   GLUCOSE 112* 10/02/2011 0558   BUN 13 10/02/2011 0558   CREATININE 0.94 10/02/2011 0558   CALCIUM 9.0 10/02/2011 0558   PROT 7.2 09/29/2011 1211   ALBUMIN 3.1* 09/29/2011 1211   AST 12 09/29/2011 1211   ALT 14 09/29/2011 1211   ALKPHOS 57 09/29/2011 1211   BILITOT 0.3 09/29/2011 1211   GFRNONAA >90 10/02/2011 0558   GFRAA >90 10/02/2011 0558     Intake/Output Summary (Last 24 hours) at 10/03/11 1411 Last data filed at 10/03/11 0930  Gross per 24 hour  Intake   1910 ml  Output       0 ml  Net   1910 ml   Last BM - 10/02/11  Weight Status:   1/29 85.1kg 2/4 81.5kg  Nutrition Dx: Predicted suboptimal energy intake - improving today  Goal: Pt to consume >75% of meals - not met r/t PNA per pt statement  Intervention: Ensure Clinical Strength BID. Encouraged increased intake. No educational needs at this time.   Monitor: Weights, intake, labs   Marshall Cork Pager #: [phone]

## 2011-10-03 NOTE — Plan of Care (Signed)
Problem: Phase I Progression Outcomes Goal: Pain controlled with appropriate interventions Outcome: Progressing Pain requesting for less pain medication - no pain med given throughout  7p-7a. Patient stated he was fine. Will cont to monitor.

## 2011-10-03 NOTE — Progress Notes (Addendum)
Chart reviewed.  Psych MD recommending IVC be discontinued and that Pt be referred to inpt S.A tx.    Spoke with Pt who stated that he has been in touch with [facility] and that he understands their admissions procedures.  He intends to schedule an appt for an intake.  He understands that this is a screening only and that it can take up to a week for admission, should admission be deemed.  Pt reported that he already had an appt scheduled with [facility] for a screening but was admitted to [facility] before he could attend the Ax.  Discussed with Pt [facility].  Pt is interested in this program and sees the value in going straight from [facility] to an inpt tx program.    CSW left 2 messages at [facility] asking if they received the information that CSW faxed to them on Friday re: Pt.  CSW to continue to follow.  Providence Crosby, LCSWA Clinical Social Work [phone]

## 2011-10-03 NOTE — Progress Notes (Signed)
   CARE MANAGEMENT NOTE 10/03/2011  Patient:  Ian Ruiz, Ian Ruiz   Account Number:  [id number]  Date Initiated:  09/20/2011  Documentation initiated by:  Lanier Clam  Subjective/Objective Assessment:   ADMITTED W/ABD PAIN.IN ROUTE TO [facility] FOR ADMISSION-SUICIDAL IDEATION,& DRUG ABUSE.XB:JYNWGNF/[id number]/IONGEXBMW ABUSE.     Action/Plan:   PSYCH/INPT REHAB @ D/C.   Anticipated DC Date:  10/05/2011   Anticipated DC Plan:  IP REHAB FACILITY  In-house referral  Clinical Social Worker      DC Planning Services  Medication Assistance      Choice offered to / List presented to:             Status of service:  In process, will continue to follow Medicare Important Message given?   (If response is "NO", the following Medicare IM given date fields will be blank) Date Medicare IM given:   Date Additional Medicare IM given:    Discharge Disposition:    Per UR Regulation:  Reviewed for med. necessity/level of care/duration of stay  Comments:  10/03/11 Yanelly Cantrelle RN,BSN NCM [phone] PNA,NOTED BOWELS HAVE MOVED.PSYCH HAS CLEARED FOR IVC,NOW RECOMMENDED FOR INPT SUBSTANCE ABUSE TX.CSW FOLLOWING.   09/30/11 Jadelyn Elks RN,BSN NCM [phone] NOTED T-101.3,LLQ ABD PAIN,URINE STRAINED,PASSED STONE.UROLOGY FOLLOWING.PSYCH SW FOLLOWING FOR STATE AUTH FOR INPT PSYCH,NOTED ON WAIT LIST.  09/27/11 Tane Biegler RN,BSN NCM [phone] MED STABLE.AWAITING AUTH FROM STATE FOR INPT PSYCH FACILITY.PSYCH SW FOLLOWING DILIGENTLY. 09/23/11 Marwa Fuhrman RN,BSN NCM [phone] CONTINUE TO FOLLOW FOR PROGRESS,& ASST W/D/C NEEDS.PSYCH/PSYCH SW FOLLOWING. D/C PLAN INPT PSYCH.NOTED NOT [facility] APPROPRIATE.AWAITING AUTH FROM THE STATE.  09/20/11 Markis Langland RN,BSN NCM [phone] QUALIFIES FOR INDIGENT FUNDS IF NEEDED.

## 2011-10-03 NOTE — Plan of Care (Signed)
Problem: Inadequate Intake (NI-2.1) Goal: Food and/or nutrient delivery Individualized approach for food/nutrient provision.  Outcome: Progressing Appetite better today

## 2011-10-04 MED ORDER — HYDROMORPHONE HCL PF 1 MG/ML IJ SOLN
1.0000 mg | Freq: Four times a day (QID) | INTRAMUSCULAR | Status: AC | PRN
  Administered 2011-10-04 – 2011-10-06 (×10): 1 mg via INTRAVENOUS
  Filled 2011-10-04 (×10): qty 1

## 2011-10-04 NOTE — Progress Notes (Signed)
Subjective:. Constipation has resolved. Troubled by pleuritic chest pain, om coughing.  Objective: Vital signs in last 24 hours: Temp:  [97.8 F (36.6 C)-98.9 F (37.2 C)] 98.3 F (36.8 C) (02/05 1444) Pulse Rate:  [60-71] 71  (02/05 1444) Resp:  [16-18] 16  (02/05 1444) BP: (110-121)/(72-80) 110/77 mmHg (02/05 1444) SpO2:  [94 %-100 %] 96 % (02/05 1444) Weight:  [81.4 kg (179 lb 7.3 oz)] 81.4 kg (179 lb 7.3 oz) (02/05 0557) Weight change: -0.1 kg (-3.5 oz) Last BM Date: 10/02/11  Intake/Output from previous day: 02/04 0701 - 02/05 0700 In: 3938 [P.O.:1080; I.V.:2100; IV Piggyback:758] Out: -  Total I/O In: 240 [P.O.:240] Out: -    Physical Exam: General: Communicative, fully oriented, not short of breath at rest, does not look toxic today.  HEENT:  No clinical pallor, no jaundice, no conjunctival injection or discharge. Hydration status is OK. NECK:  Supple, JVP not seen, no carotid bruits, no palpable lymphadenopathy, no palpable goiter. CHEST:  Clinically clear to auscultation, no wheeze, no crackles. HEART:  Sounds 1 and 2 heard, normal, regular, no murmurs. ABDOMEN:  Full, soft, mild-moderate tenderness in LLQ and supraumbilically, no palpable organomegaly, no palpable masses, normal bowel sounds. GENITALIA:  Not examined. LOWER EXTREMITIES:  No pitting edema, palpable peripheral pulses. MUSCULOSKELETAL SYSTEM:  Unremarkable. CENTRAL NERVOUS SYSTEM:  No focal neurologic deficit on gross examination.  Lab Results:  [facility] 10/02/11 0558  WBC 5.6  HGB 11.1*  HCT 34.9*  PLT 226    Basename 10/02/11 0558  NA 133*  K 3.9  CL 99  CO2 26  GLUCOSE 112*  BUN 13  CREATININE 0.94  CALCIUM 9.0   Recent Results (from the past 240 hour(s))  CULTURE, BLOOD (ROUTINE X 2)     Status: Normal (Preliminary result)   Collection Time   09/30/11 12:19 PM      Component Value Range Status Comment   Specimen Description BLOOD LEFT HAND   Final    Special Requests BOTTLES  DRAWN AEROBIC ONLY 4CC   Final    Culture  Setup Time [id number]   Final    Culture     Final    Value:        BLOOD CULTURE RECEIVED NO GROWTH TO DATE CULTURE WILL BE HELD FOR 5 DAYS BEFORE ISSUING A FINAL NEGATIVE REPORT   Report Status PENDING   Incomplete   CULTURE, BLOOD (ROUTINE X 2)     Status: Normal (Preliminary result)   Collection Time   09/30/11 12:20 PM      Component Value Range Status Comment   Specimen Description BLOOD LEFT HAND   Final    Special Requests BOTTLES DRAWN AEROBIC ONLY 4CC   Final    Culture  Setup Time [id number]   Final    Culture     Final    Value:        BLOOD CULTURE RECEIVED NO GROWTH TO DATE CULTURE WILL BE HELD FOR 5 DAYS BEFORE ISSUING A FINAL NEGATIVE REPORT   Report Status PENDING   Incomplete   URINE CULTURE     Status: Normal   Collection Time   09/30/11  2:30 PM      Component Value Range Status Comment   Specimen Description URINE, CLEAN CATCH   Final    Special Requests NONE   Final    Culture  Setup Time [id number]   Final    Colony Count NO GROWTH   Final    Culture  NO GROWTH   Final    Report Status 10/02/2011 FINAL   Final      Studies/Results: Dg Chest 2 View  10/03/2011  *RADIOLOGY REPORT*  Clinical Data: Recently diagnosed with pneumonia, now with shortness of breath, coughing and chest discomfort  CHEST - 2 VIEW  Comparison: 09/30/2011; 09/19/2011; CT abdomen pelvis - 09/30/2011  Findings:  Grossly unchanged cardiac silhouette and mediastinal contours. The previously identified bilateral lower lung and left mid lung heterogeneous air space opacities are grossly unchanged compared to recent prior examination.  Interval development of an additional ill-defined airspace opacity within the right mid lung. Nodular opacities overlying the bilateral lower lung favored to represent nipple shadows.  No pleural effusion or pneumothorax.  Unchanged bones.  IMPRESSION: Findings most suggestive of progressive multifocal infection.  A follow-up  chest radiograph in 4 to 6 weeks after treatment is recommended to ensure resolution.  Original Report Authenticated By: Waynard Reeds, M.D.   Ct Angio Chest W/cm &/or Wo Cm  10/03/2011  *RADIOLOGY REPORT*  Clinical Data: Chest discomfort, evaluate for pulmonary embolism  CT ANGIOGRAPHY CHEST  Technique:  Multidetector CT imaging of the chest using the standard protocol during bolus administration of intravenous contrast. Multiplanar reconstructed images including MIPs were obtained and reviewed to evaluate the vascular anatomy.  Contrast: 80mL OMNIPAQUE IOHEXOL 300 MG/ML IV SOLN  Comparison: Chest radiograph - 10/03/2011; 09/30/2011; CT abdomen pelvis - 09/30/2011  Findings:  There is adequate opacification of the pulmonary arteries with the main pulmonary artery measuring 301 HU.  There are no discrete filling defects within the main, right, left, segmental or subsegmental branch vessels of the pulmonary arteries to suggest acute pulmonary embolism.  Scattered heterogeneous predominantly ground-glass opacities primarily within the right upper lobe and superior segment of the right lower lobe, some of which within the anterior segment of the right upper lobe may have a tree in bud type configuration (image 33 and 35).  Dependent bibasilar heterogeneous opacities are favored to represent subsegmental atelectasis.  There is centrally calcified 9 mm granuloma within the right lower lung (image 49, series 14). Central airways are patent.  No pleural effusion or pneumothorax.  Borderline enlarged right hilar lymph node measures approximate 1.1 cm in greatest short axis diameter (image 118, series six). Subcarinal nodal conglomeration measures approximately 1.5 cm in greatest short axis diameter.  Additional scattered shoddy paratracheal lymph nodes are not enlarged by CT criteria.  Shoddy bilateral axillary lymph nodes are not enlarged by CT criteria.  Normal heart size. No pericardial effusion.  Normal caliber of  the thoracic aorta.  Normal configuration of the aortic arch.  There is no periaortic stranding.  Early arterial phase evaluation of the upper abdomen demonstrates a sub centimeter hypoattenuating lesion within the anterior aspect the right lobe of the liver (image 77, series 11) which is too small to accurately characterize.  Incidental note is made of a small splenule.  High-density pill fragment is noted within the gastric fundus (image 70).  No acute or aggressive osseous abnormalities within the thorax.  IMPRESSION:  1.  No pulmonary embolism.  2.  Ill-defined ground-glass opacities within the right upper and lower lobe, some of which demonstrate a tree in bud configuration, are worrisome for infection, including atypicals.  3.  Borderline enlarged right hilar and shoddy mediastinal lymph nodes are nonspecific, possibly reactive in etiology.  Original Report Authenticated By: Waynard Reeds, M.D.    Medications: Scheduled Meds:    . citalopram  30 mg Oral Daily  . clonazePAM  1 mg Oral TID  . docusate sodium  100 mg Oral BID  . feeding supplement  237 mL Oral BID BM  . guaiFENesin  600 mg Oral BID  . ibuprofen  600 mg Oral TID  . imipenem-cilastatin  500 mg Intravenous Q6H  . nicotine  21 mg Transdermal Daily  . pantoprazole  40 mg Oral BID AC  . polyethylene glycol  17 g Oral BID  . senna-docusate  2 tablet Oral Daily  . vancomycin  750 mg Intravenous Q8H   Continuous Infusions:    . sodium chloride 100 mL/hr at 10/03/11 1139   PRN Meds:.acetaminophen, acetaminophen, alum & mag hydroxide-simeth, diphenhydrAMINE, guaiFENesin-dextromethorphan, morphine injection, ondansetron, ondansetron (ZOFRAN) IV, ondansetron, oxyCODONE, senna-docusate  Assessment/Plan: Principal Problem:  *Abdominal pain, acute, bilateral lower quadrant  Patient is s/p Abdo ct scan on 09/19/11, which showed nonobstructing 4 mm right renal calculus. In the last few days, he has spiked temperature a few times, and  passed a stone on 09/27/11, which was reportedly sent for analysis. Patient just completed a 7 day course of Levaquin on 09/21/11. Seen by Dr Brunilda Payor, Urology, on 09/29/11. Input much appreciated. No intervention indicated. CT abdomen/pelvis done 09/30/11, showed extensive amount of stool throughout the length of the colon. Has responded to enemas, continued Miralax, and reduction of Oxycodone to 15 mg prn q4 hours, for pain. Bowels have moved well, since 10/01/11, and abdominal discomfort/pain, has significantly improved.  Active Problems:  1. ARF (acute renal failure): Likely prerenal, secondary to dehydration. This has now resolved, with iv fluids.  2. UTI: Now resolved, following 7 days treatment with Levaquin. Urinalysis of 09/30/11, was negative.  3. Suicidal ideation: Has been re-evaluated by Dr Ferol Luz, psychiatrist, on 09/30/2011, and she has opined that patient is no longer suicidal, and that suicide precautions and involuntary commitment, be discontinued.  4. Bipolar disorder (manic depression)  On Citalopram, per psychiatrist, and appears stable. 5. HAP: Patient had pyrexia on morning of 09/30/11, since then, temperature had been between 101-102, and wcc is elevated at 14.2. CXR of 09/30/11, showed findings suspicious for interval development of left lower lobe bronchopneumonia and probable right lower lobe pneumonia. This was confirmed by CT scan findings of the same date, which showed bibasilar patchy airspace disease. Patient was commenced on Vancomycin/Primaxin on 09/30/11, and is now on day# 5. CXR of 10/03/11, showed progression of airspace disease, although patient has remained apyrexial, and wcc has normalized. Patient continues to complain of chest pain. Now on NSAID, and doubled PPI. As patient has not mobilized much during his hospitalization, checked CTA, to rule out PE, masquerading as pneumonia. CTA of 10/03/11, showed No pulmonary embolism, ill-defined ground-glass opacities within the right upper and lower  lobe, some of which demonstrate a tree in bud configuration, are worrisome for infection, including atypicals. Borderline enlarged right hilar and shoddy mediastinal lymph nodes are nonspecific, possibly reactive in etiology. 6. GERD: Patient complains of epigastric/retrosternal burning discomfort. The symptoms are consistent with GERD. He was started on PPI, on 10/02/11.  Comment: Patient is clinically stable at this time, although needing continued treatment with iv antibiotics at this time, given the extent of his radiologic changes. He continues to request opioids and exhibit drug-seeking behavior, despite my attempts to wean these off, without causing him undue discomfort. I think Dr Jesusita Oka (psychiatrist) consultation notes of 10/02/11, are very pertinent, and continued use of opioids will ultimately militate against optimal disposition.  LOS: 15  days   Serene Kopf,[name] 10/04/2011, 3:37 PM

## 2011-10-04 NOTE — Progress Notes (Signed)
Patient asked to see the unit's charge nurse.  He has concern that he has been waiting to see his doctor since 8am when he was first asked for pain medication and was told that his doctor wanted to assess him before ordering him any additional pain medication, and he still has a 7 out of 10 pain now radiating from his chest to his back and the medication he has been taking is not relieving his pain.  I spoke with Dr. Brien Few expressing the patient's concern and was instructed he would make rounds but was detained at this time with other patients.  Informed patient that doctor was detained and he states that he wants this doctor removed from his case because he does not feel he has appropriately responded to his needs and his care is just as important as other patients.

## 2011-10-04 NOTE — Progress Notes (Signed)
Call made to Dr. Rito Ehrlich with patient's request to have Dr. Brien Few removed as his doctor and a new doctor assigned to his case, also with the request regarding meeting the needs of his pain control which have been ignored.  Explained chain of events since 0830 this morning.  Dr. Rito Ehrlich will assign another doctor but will not be effective until tomorrow 10/05/11 and he will also contact Dr. Brien Few regarding the patient's request and needs.

## 2011-10-04 NOTE — Progress Notes (Signed)
Dr Brien Few returned call and wants to see pt before changing pain meds. Informed pt of this.

## 2011-10-04 NOTE — Progress Notes (Signed)
Pt is stating that pain med has not helped that pain is now going to his back.  Dr. Brien Few sent a text page.

## 2011-10-04 NOTE — Consult Note (Signed)
Reason for Consult:Polysubstance abuse, Overdose, suicidal ideation, Depression NOS Referring Physician: Dr. Agustina Caroli is an 41 y.o. male.  HPI: Pt took overdose of benzodiazepine, used other drugs cannabis and cocaine without recognition of use.  For weeks he has been adamantly suicidal claiming he would end his life as soon as he was discharged. IVC has been in place with sitter.   He is treated for pneumonia.   AXIS I   Polysubstance abuse, Major depression moderate AXIS II  Deferred AXIS III   Past Medical History  Diagnosis Date  . Ulcerative colitis   . Meniere disease   . Bipolar 1 disorder, depressed   . Substance abuse     History reviewed. No pertinent past surgical history. AXIS IV  Homeless, finances, parenting issues, employment  AXIS V  GAF  43  History reviewed. No pertinent family history.  Social History:  reports that he has been smoking.  He has never used smokeless tobacco. He reports that he uses illicit drugs (Marijuana and Cocaine). He reports that he does not drink alcohol.  Allergies:  Allergies  Allergen Reactions  . Penicillins Rash    Medications: I have reviewed patient's current medications  No results found for this or any previous visit (from the past 48 hour(s)).  Dg Chest 2 View  10/03/2011  *RADIOLOGY REPORT*  Clinical Data: Recently diagnosed with pneumonia, now with shortness of breath, coughing and chest discomfort  CHEST - 2 VIEW  Comparison: 09/30/2011; 09/19/2011; CT abdomen pelvis - 09/30/2011  Findings:  Grossly unchanged cardiac silhouette and mediastinal contours. The previously identified bilateral lower lung and left mid lung heterogeneous air space opacities are grossly unchanged compared to recent prior examination.  Interval development of an additional ill-defined airspace opacity within the right mid lung. Nodular opacities overlying the bilateral lower lung favored to represent nipple shadows.  No pleural effusion or  pneumothorax.  Unchanged bones.  IMPRESSION: Findings most suggestive of progressive multifocal infection.  A follow-up chest radiograph in 4 to 6 weeks after treatment is recommended to ensure resolution.  Original Report Authenticated By: Waynard Reeds, M.D.   Ct Angio Chest W/cm &/or Wo Cm  10/03/2011  *RADIOLOGY REPORT*  Clinical Data: Chest discomfort, evaluate for pulmonary embolism  CT ANGIOGRAPHY CHEST  Technique:  Multidetector CT imaging of the chest using the standard protocol during bolus administration of intravenous contrast. Multiplanar reconstructed images including MIPs were obtained and reviewed to evaluate the vascular anatomy.  Contrast: 80mL OMNIPAQUE IOHEXOL 300 MG/ML IV SOLN  Comparison: Chest radiograph - 10/03/2011; 09/30/2011; CT abdomen pelvis - 09/30/2011  Findings:  There is adequate opacification of the pulmonary arteries with the main pulmonary artery measuring 301 HU.  There are no discrete filling defects within the main, right, left, segmental or subsegmental branch vessels of the pulmonary arteries to suggest acute pulmonary embolism.  Scattered heterogeneous predominantly ground-glass opacities primarily within the right upper lobe and superior segment of the right lower lobe, some of which within the anterior segment of the right upper lobe may have a tree in bud type configuration (image 33 and 35).  Dependent bibasilar heterogeneous opacities are favored to represent subsegmental atelectasis.  There is centrally calcified 9 mm granuloma within the right lower lung (image 49, series 14). Central airways are patent.  No pleural effusion or pneumothorax.  Borderline enlarged right hilar lymph node measures approximate 1.1 cm in greatest short axis diameter (image 118, series six). Subcarinal nodal conglomeration measures approximately 1.5  cm in greatest short axis diameter.  Additional scattered shoddy paratracheal lymph nodes are not enlarged by CT criteria.  Shoddy bilateral  axillary lymph nodes are not enlarged by CT criteria.  Normal heart size. No pericardial effusion.  Normal caliber of the thoracic aorta.  Normal configuration of the aortic arch.  There is no periaortic stranding.  Early arterial phase evaluation of the upper abdomen demonstrates a sub centimeter hypoattenuating lesion within the anterior aspect the right lobe of the liver (image 77, series 11) which is too small to accurately characterize.  Incidental note is made of a small splenule.  High-density pill fragment is noted within the gastric fundus (image 70).  No acute or aggressive osseous abnormalities within the thorax.  IMPRESSION:  1.  No pulmonary embolism.  2.  Ill-defined ground-glass opacities within the right upper and lower lobe, some of which demonstrate a tree in bud configuration, are worrisome for infection, including atypicals.  3.  Borderline enlarged right hilar and shoddy mediastinal lymph nodes are nonspecific, possibly reactive in etiology.  Original Report Authenticated By: Waynard Reeds, M.D.    Review of Systems  Unable to perform ROS: other   Blood pressure 123/75, pulse 64, temperature 98.2 F (36.8 C), temperature source Oral, resp. rate 12, height 5\' 10"  (1.778 m), weight 81.4 kg (179 lb 7.3 oz), SpO2 96.00%. Physical Exam  Assessment/Plan:  Follow up with patient IVC persists due to suicide attempt and prior psychiatric admissions with inappropriate interaction with other patients.  He has developed pneumonia and is currently being treated for it.  Since admission and learning his admission application was submitted to [facility] he declares he wants to go to rehab.  He then admits his substance abuse.  RECOMMENDATIONS; 1. Pt is cognitively intact.  He declares he is no longer suicidal.  Suggest removal of sitter, suicide watch. 2. Continue IVC for rehab transfer when medically stable.  3. Will follow this patient.  Daquisha Clermont 10/04/2011, 11:30 PM

## 2011-10-04 NOTE — Progress Notes (Signed)
Pt c/o pain that is worse and states "i have been patient, and have waited for several hours and the Dr still hasn't come, can you please call him" Text page sent to Dr Brien Few.

## 2011-10-04 NOTE — Progress Notes (Signed)
Follow up - continued emotional support.   Pt expressed concern about his belongings, which are at [employer].  [facility].  Shelter is affiliated with [employer] and [employer].  Pt gave chaplain permission to contact concerning belongings. Will consult with nursing and SW to attempt to retreive pt belongings before discharge.     10/04/11 1800  Clinical Encounter Type  Visited With Patient  Visit Type Follow-up;Psychological support;Spiritual support;Social support  Consult/Referral To Chaplain  Spiritual Encounters  Spiritual Needs Emotional  Stress Factors  Patient Stress Factors Financial concerns;Major life changes

## 2011-10-05 LAB — BASIC METABOLIC PANEL
BUN: 14 mg/dL (ref 6–23)
CO2: 25 mEq/L (ref 19–32)
Chloride: 104 mEq/L (ref 96–112)
GFR calc Af Amer: >90 mL/min (ref >90)
Glucose, Bld: 91 mg/dL (ref 70–99)
Potassium: 4.1 mEq/L (ref 3.5–5.1)

## 2011-10-05 LAB — CBC
HCT: 35.2 % — ABNORMAL LOW (ref 39.0–52.0)
Hemoglobin: 11.7 g/dL — ABNORMAL LOW (ref 13.0–17.0)
RBC: 4 MIL/uL — ABNORMAL LOW (ref 4.22–5.81)
WBC: 5.8 10*3/uL (ref 4.0–10.5)

## 2011-10-05 NOTE — Progress Notes (Signed)
Pharmacy Note Addendum:  Vancomycin trough today 18.3 Goal range = 15-20  Will continue present Vancomycin dosage (750mg  q8h).  Elie Goody, PharmD, BCPS Pager; [phone] 10/05/2011  11:38 AM

## 2011-10-05 NOTE — Progress Notes (Signed)
ANTIBIOTIC CONSULT NOTE - FOLLOW UP  Pharmacy Consult for Vancomycin/Primaxin Indication: HCAP  Allergies  Allergen Reactions  . Penicillins Rash    Patient Measurements: Height: 5\' 10"  (177.8 cm) Weight: 174 lb 6.1 oz (79.1 kg) IBW/kg (Calculated) : 73   Vital Signs: Temp: 98.5 F (36.9 C) (02/06 0505) Temp src: Oral (02/06 0505) BP: 124/72 mmHg (02/06 0505) Pulse Rate: 56  (02/06 0505) Intake/Output from previous day: 02/05 0701 - 02/06 0700 In: 2018.7 [P.O.:242; I.V.:1426.7; IV Piggyback:350] Out: -  Intake/Output from this shift:    Labs:  Basename 10/05/11 0453  WBC 5.8  HGB 11.7*  PLT 299  LABCREA --  CREATININE 0.97   Estimated Creatinine Clearance: 102.4 ml/min (by C-G formula based on Cr of 0.97).  Basename 10/02/11 1750  VANCOTROUGH 18.0  VANCOPEAK --  VANCORANDOM --  GENTTROUGH --  GENTPEAK --  GENTRANDOM --  TOBRATROUGH --  TOBRAPEAK --  TOBRARND --  AMIKACINPEAK --  AMIKACINTROU --  AMIKACIN --     Microbiology: Recent Results (from the past 720 hour(s))  URINE CULTURE     Status: Normal   Collection Time   09/19/11  6:36 PM      Component Value Range Status Comment   Specimen Description URINE, CATHETERIZED   Final    Special Requests NONE   Final    Culture  Setup Time [id number]   Final    Colony Count NO GROWTH   Final    Culture NO GROWTH   Final    Report Status 09/20/2011 FINAL   Final   CULTURE, BLOOD (ROUTINE X 2)     Status: Normal   Collection Time   09/19/11  7:38 PM      Component Value Range Status Comment   Specimen Description BLOOD LEFT AC   Final    Special Requests BOTTLES DRAWN AEROBIC AND ANAEROBIC 4 CC EACH   Final    Culture  Setup Time [id number]   Final    Culture NO GROWTH 5 DAYS   Final    Report Status 09/26/2011 FINAL   Final   CULTURE, BLOOD (ROUTINE X 2)     Status: Normal   Collection Time   09/19/11  8:40 PM      Component Value Range Status Comment   Specimen Description BLOOD LEFT ARM    Final    Special Requests BOTTLES DRAWN AEROBIC AND ANAEROBIC 10 CC EACH   Final    Culture  Setup Time [id number]   Final    Culture NO GROWTH 5 DAYS   Final    Report Status 09/26/2011 FINAL   Final   CULTURE, BLOOD (ROUTINE X 2)     Status: Normal (Preliminary result)   Collection Time   09/30/11 12:19 PM      Component Value Range Status Comment   Specimen Description BLOOD LEFT HAND   Final    Special Requests BOTTLES DRAWN AEROBIC ONLY 4CC   Final    Culture  Setup Time [id number]   Final    Culture     Final    Value:        BLOOD CULTURE RECEIVED NO GROWTH TO DATE CULTURE WILL BE HELD FOR 5 DAYS BEFORE ISSUING A FINAL NEGATIVE REPORT   Report Status PENDING   Incomplete   CULTURE, BLOOD (ROUTINE X 2)     Status: Normal (Preliminary result)   Collection Time   09/30/11 12:20 PM      Component Value Range  Status Comment   Specimen Description BLOOD LEFT HAND   Final    Special Requests BOTTLES DRAWN AEROBIC ONLY 4CC   Final    Culture  Setup Time [id number]   Final    Culture     Final    Value:        BLOOD CULTURE RECEIVED NO GROWTH TO DATE CULTURE WILL BE HELD FOR 5 DAYS BEFORE ISSUING A FINAL NEGATIVE REPORT   Report Status PENDING   Incomplete   URINE CULTURE     Status: Normal   Collection Time   09/30/11  2:30 PM      Component Value Range Status Comment   Specimen Description URINE, CLEAN CATCH   Final    Special Requests NONE   Final    Culture  Setup Time [id number]   Final    Colony Count NO GROWTH   Final    Culture NO GROWTH   Final    Report Status 10/02/2011 FINAL   Final     Anti-infectives     Start     Dose/Rate Route Frequency Ordered Stop   09/30/11 1800  imipenem-cilastatin (PRIMAXIN) 500 mg in sodium chloride 0.9 % 100 mL IVPB       500 mg 200 mL/hr over 30 Minutes Intravenous 4 times per day 09/30/11 1708     09/30/11 1800   vancomycin (VANCOCIN) 750 mg in sodium chloride 0.9 % 150 mL IVPB        750 mg 150 mL/hr over 60 Minutes Intravenous  Every 8 hours 09/30/11 1708     09/21/11 1000   levofloxacin (LEVAQUIN) tablet 500 mg  Status:  Discontinued        500 mg Oral Daily 09/21/11 0854 09/26/11 0836   09/20/11 2000   levofloxacin (LEVAQUIN) IVPB 500 mg  Status:  Discontinued        500 mg 100 mL/hr over 60 Minutes Intravenous Every 24 hours 09/20/11 1900 09/21/11 0854          Assessment:  D6 Vancomycin 750 q8h/Primaxin 500 q6h for HCAP.   Temp and WBC have normalized but radiographic evidence suggests infection persists.    SCr stable.  Plan:   Continue Vancomycin and Primaxin at current dosages.  Recheck Vancomycin trough today.  Marijo Conception, Pharmacy Student 10/05/2011,7:50 AM  Elie Goody, PharmD, BCPS Pager; [phone] 10/05/2011  9:07 AM

## 2011-10-05 NOTE — Progress Notes (Signed)
Patient discussed at the Long Length of Stay [name] Weeks 10/05/2011  

## 2011-10-05 NOTE — Progress Notes (Signed)
Discussed my conversation with [facility]'s admission coordinator with Pt.  Pt feels that [facility] may be the best route, as he's concerned about the service gap that will occur if he's d/c'd and sent to [facility].  Pt hoping to be admitted to [facility] upon d/c.  Provided Pt with print outs from [employer].  CSW to continue to follow.  Providence Crosby, LCSWA Clinical Social Work [phone]

## 2011-10-05 NOTE — Progress Notes (Signed)
PROGRESS NOTE  Ian Ruiz WUJ:[id number] DOB: [birth year] DOA: 09/19/2011 PCP: No primary provider on file.  Brief narrative: 41 year old man admitted for abdominal pain. Currently being treated for pneumonia.  Past medical history: Ulcerative colitis, Mnire's disease, bipolar disorder, substance abuse  Consultants:  Psychiatry  Urology  Procedures:    Antibiotics:    Interim History:   Subjective: Feels okay today. Complains of some shortness of breath.  Objective: Filed Vitals:   10/04/11 1444 10/04/11 2108 10/05/11 0505 10/05/11 1403  BP: 110/77 123/75 124/72 110/56  Pulse: 71 64 56 69  Temp: 98.3 F (36.8 C) 98.2 F (36.8 C) 98.5 F (36.9 C) 97.8 F (36.6 C)  TempSrc: Oral Oral Oral Oral  Resp: 16 12 14 18   Height:      Weight:   79.1 kg (174 lb 6.1 oz)   SpO2: 96% 96% 94% 96%    Intake/Output Summary (Last 24 hours) at 10/05/11 1500 Last data filed at 10/05/11 1450  Gross per 24 hour  Intake 668.67 ml  Output      0 ml  Net 668.67 ml    Exam:  General: Appears calm and comfortable. Cardiovascular: Regular rate and rhythm. No murmur, rub, gallop. No lower extremity edema. Respiratory: Clear to auscultation bilaterally. No wheezes, rales, rhonchi. Normal respiratory effort  Data Reviewed: Basic Metabolic Panel:  Lab 10/05/11 9562 10/02/11 0558 10/01/11 0454 09/29/11 0510  NA 138 133* 132* 135  K 4.1 3.9 -- --  CL 104 99 96 96  CO2 25 26 25 28   GLUCOSE 91 112* 114* 102*  BUN 14 13 16 17   CREATININE 0.97 0.94 1.17 1.07  CALCIUM 8.6 9.0 8.8 9.4  MG -- -- -- --  PHOS -- -- -- --   Liver Function Tests:  Lab 09/29/11 1211  AST 12  ALT 14  ALKPHOS 57  BILITOT 0.3  PROT 7.2  ALBUMIN 3.1*    Lab 10/02/11 0558  LIPASE 21  AMYLASE --   CBC:  Lab 10/05/11 0453 10/02/11 0558 10/01/11 0454 09/30/11 0445 09/29/11 0510  WBC 5.8 5.6 14.2* 8.3 7.6  NEUTROABS -- -- -- -- --  HGB 11.7* 11.1* 12.5* 12.6* 13.0  HCT 35.2* 34.9* 37.2*  37.8* 38.7*  MCV 88.0 88.6 87.7 89.4 88.6  PLT 299 226 266 251 254     Recent Results (from the past 240 hour(s))  CULTURE, BLOOD (ROUTINE X 2)     Status: Normal (Preliminary result)   Collection Time   09/30/11 12:19 PM      Component Value Range Status Comment   Specimen Description BLOOD LEFT HAND   Final    Special Requests BOTTLES DRAWN AEROBIC ONLY 4CC   Final    Culture  Setup Time [id number]   Final    Culture     Final    Value:        BLOOD CULTURE RECEIVED NO GROWTH TO DATE CULTURE WILL BE HELD FOR 5 DAYS BEFORE ISSUING A FINAL NEGATIVE REPORT   Report Status PENDING   Incomplete   CULTURE, BLOOD (ROUTINE X 2)     Status: Normal (Preliminary result)   Collection Time   09/30/11 12:20 PM      Component Value Range Status Comment   Specimen Description BLOOD LEFT HAND   Final    Special Requests BOTTLES DRAWN AEROBIC ONLY 4CC   Final    Culture  Setup Time [id number]   Final    Culture     Final  Value:        BLOOD CULTURE RECEIVED NO GROWTH TO DATE CULTURE WILL BE HELD FOR 5 DAYS BEFORE ISSUING A FINAL NEGATIVE REPORT   Report Status PENDING   Incomplete   URINE CULTURE     Status: Normal   Collection Time   09/30/11  2:30 PM      Component Value Range Status Comment   Specimen Description URINE, CLEAN CATCH   Final    Special Requests NONE   Final    Culture  Setup Time [id number]   Final    Colony Count NO GROWTH   Final    Culture NO GROWTH   Final    Report Status 10/02/2011 FINAL   Final      Studies: Ct Abdomen Pelvis Wo Contrast  09/19/2011  *RADIOLOGY REPORT*  Clinical Data: Lower abdominal and testicular pain.  Dysuria. History of kidney stones.  History of ulcerative colitis.  CT ABDOMEN AND PELVIS WITHOUT CONTRAST  Technique:  Multidetector CT imaging of the abdomen and pelvis was performed following the standard protocol without intravenous contrast.  Comparison: 08/01/2011.  Findings: Since the prior examination, the patient has developed a moderate  amount of low density ascites.  The etiology of ascites is indeterminate.  This is not centered around the pancreas to suggest pancreatitis.  Fluid extends into the small bowel mesentery and surrounds the jejunal loops.  It is possible this is related to an enteritis or colitis.  The colon and small bowel do not appear thickened.  Barium is noted entering the appendix which does not appear to be a primary source of the ascites.  Sigmoid colon remains under filled. This may be fortuitous although stricture not excluded.  Unenhanced imaging without focal liver, splenic, pancreatic, adrenal or left renal lesion.  Nonobstructing 4 mm right renal calculus.  No calcified gallstones.  No free intraperitoneal air.  Prostate gland top normal in size.  Unenhanced imaging of the urinary bladder unremarkable.  Scattered normal to top normal size upper abdominal and retroperitoneal lymph nodes.  Small calcification lower abdominal aorta without evidence of aneurysmal dilation.  No bony destructive lesion.  Peripheral atelectasis lung bases greater on the left.  Granuloma right lung base.  IMPRESSION: Interval development of moderate ascites.  Etiology indeterminate. Please see above discussion.  Original Report Authenticated By: Fuller Canada, M.D.   Dg Chest 2 View  10/03/2011  *RADIOLOGY REPORT*  Clinical Data: Recently diagnosed with pneumonia, now with shortness of breath, coughing and chest discomfort  CHEST - 2 VIEW  Comparison: 09/30/2011; 09/19/2011; CT abdomen pelvis - 09/30/2011  Findings:  Grossly unchanged cardiac silhouette and mediastinal contours. The previously identified bilateral lower lung and left mid lung heterogeneous air space opacities are grossly unchanged compared to recent prior examination.  Interval development of an additional ill-defined airspace opacity within the right mid lung. Nodular opacities overlying the bilateral lower lung favored to represent nipple shadows.  No pleural effusion or  pneumothorax.  Unchanged bones.  IMPRESSION: Findings most suggestive of progressive multifocal infection.  A follow-up chest radiograph in 4 to 6 weeks after treatment is recommended to ensure resolution.  Original Report Authenticated By: Waynard Reeds, M.D.   Dg Chest 2 View  09/30/2011  *RADIOLOGY REPORT*  Clinical Data: Shortness of breath with chest pain and coughing for several weeks with yellow blood-tinged mucus.  No hypertension. Fever.  Nonsmoker  CHEST - 2 VIEW  Comparison: 09/19/2011  Findings: Heart and mediastinal contours are within  normal limits. Persistent left basilar linear scarring or subsegmental atelectasis is seen.  There has been interval development of alveolar infiltrate involving the posterior and lateral segments of the left lower lobe and questionable in the posterior right lower lobe. This finding is suspicious for areas of focal bronchopneumonia.  No pleural fluid or signs of congestive failure are seen.  Bony structures appear intact.  IMPRESSION: Findings suspicious for interval development of left lower lobe bronchopneumonia and probable right lower lobe pneumonia.  This result was called to the floor and reported to the patient's nurse, Ester at 2:55 pm.  Original Report Authenticated By: Bertha Stakes, M.D.   Ct Angio Chest W/cm &/or Wo Cm  10/03/2011  *RADIOLOGY REPORT*  Clinical Data: Chest discomfort, evaluate for pulmonary embolism  CT ANGIOGRAPHY CHEST  Technique:  Multidetector CT imaging of the chest using the standard protocol during bolus administration of intravenous contrast. Multiplanar reconstructed images including MIPs were obtained and reviewed to evaluate the vascular anatomy.  Contrast: 80mL OMNIPAQUE IOHEXOL 300 MG/ML IV SOLN  Comparison: Chest radiograph - 10/03/2011; 09/30/2011; CT abdomen pelvis - 09/30/2011  Findings:  There is adequate opacification of the pulmonary arteries with the main pulmonary artery measuring 301 HU.  There are no discrete  filling defects within the main, right, left, segmental or subsegmental branch vessels of the pulmonary arteries to suggest acute pulmonary embolism.  Scattered heterogeneous predominantly ground-glass opacities primarily within the right upper lobe and superior segment of the right lower lobe, some of which within the anterior segment of the right upper lobe may have a tree in bud type configuration (image 33 and 35).  Dependent bibasilar heterogeneous opacities are favored to represent subsegmental atelectasis.  There is centrally calcified 9 mm granuloma within the right lower lung (image 49, series 14). Central airways are patent.  No pleural effusion or pneumothorax.  Borderline enlarged right hilar lymph node measures approximate 1.1 cm in greatest short axis diameter (image 118, series six). Subcarinal nodal conglomeration measures approximately 1.5 cm in greatest short axis diameter.  Additional scattered shoddy paratracheal lymph nodes are not enlarged by CT criteria.  Shoddy bilateral axillary lymph nodes are not enlarged by CT criteria.  Normal heart size. No pericardial effusion.  Normal caliber of the thoracic aorta.  Normal configuration of the aortic arch.  There is no periaortic stranding.  Early arterial phase evaluation of the upper abdomen demonstrates a sub centimeter hypoattenuating lesion within the anterior aspect the right lobe of the liver (image 77, series 11) which is too small to accurately characterize.  Incidental note is made of a small splenule.  High-density pill fragment is noted within the gastric fundus (image 70).  No acute or aggressive osseous abnormalities within the thorax.  IMPRESSION:  1.  No pulmonary embolism.  2.  Ill-defined ground-glass opacities within the right upper and lower lobe, some of which demonstrate a tree in bud configuration, are worrisome for infection, including atypicals.  3.  Borderline enlarged right hilar and shoddy mediastinal lymph nodes are  nonspecific, possibly reactive in etiology.  Original Report Authenticated By: Waynard Reeds, M.D.   Ct Abdomen Pelvis W Contrast  09/30/2011  *RADIOLOGY REPORT*  Clinical Data: Abdominal pain and fever  CT ABDOMEN AND PELVIS WITH CONTRAST  Technique:  Multidetector CT imaging of the abdomen and pelvis was performed following the standard protocol during bolus administration of intravenous contrast.  Contrast:  100 ml Isovue 300  Comparison: 09/19/2011  Findings: Heterogeneous opacities have developed  at both lung bases worrisome for airspace disease.  Liver, gallbladder, spleen, adrenal glands, pancreas are within normal limits.  Extensive amount of stool throughout the length of the colon.  Tiny hypodensities in the kidneys are stable.  Free fluid has nearly resolved.  There is a tiny amount of residual ascites in the pelvis.  Gas is noted in the bladder right the from recent catheterization.  Unremarkable prostate.  Normal appendix.  IMPRESSION: Extensive stool.  Bibasilar patchy airspace disease.  Original Report Authenticated By: Donavan Burnet, M.D.   US Renal  09/19/2011  *RADIOLOGY REPORT*  Clinical Data: Renal failure.  Gross hematuria.  Elevated white blood count.  RENAL/URINARY TRACT ULTRASOUND COMPLETE  Comparison:  CT scan dated 09/19/2011  Findings:  Right Kidney:  Normal.  9.9 cm in length.  Left Kidney:  Normal.  10.6 cm in length.  Bladder:  Foley catheter in place.  However, the bladder is distended.  Bilateral ureteral jets are visualized.  The tip of a Foley catheter is adjacent to the dome of the bladder.  IMPRESSION:  1.  Normal kidneys. 2.  Distended urinary bladder.  Foley catheter in place with the tip against the superior wall of the bladder.  Original Report Authenticated By: Gwynn Burly, M.D.   US Abdomen Limited  09/20/2011  *RADIOLOGY REPORT*  LIMITED ABDOMINAL ULTRASOUND  The patient had prior CT scan which stated the patient had moderate ascites.  Ultrasound imaging was  performed of all four quadrants of the abdomen and image documentation was obtained.  No ascites was noted on ultrasound for needle aspiration.  Impression:  Limited abdominal ultrasound revealing no ascites for paracentesis procedure.  Read by: Anselm Pancoast, P.A.-C  Original Report Authenticated By: Waynard Reeds, M.D.   Portable Chest 1 View  09/19/2011  *RADIOLOGY REPORT*  Clinical Data: Shortness of breath.  PORTABLE CHEST - 1 VIEW  Comparison: None.  Findings: Minimal linear atelectasis is seen in the periphery of the left lung base.  Lungs otherwise clear.  Heart size normal.  No pneumothorax or effusion.  No focal bony abnormality.  IMPRESSION: No acute disease.  Original Report Authenticated By: Bernadene Bell. D'ALESSIO, M.D.   Dg Abd Acute W/chest  09/08/2011  *RADIOLOGY REPORT*  Clinical Data: Right lower quadrant pain. Ulcerative colitis.  ACUTE ABDOMEN SERIES (ABDOMEN 2 VIEW & CHEST 1 VIEW)  Comparison: None.  Findings: The bowel gas pattern is within normal limits.  No evidence of air-fluid levels or free air.  Heart size is normal.  Mild central peribronchial thickening is noted, however there is no evidence of pulmonary infiltrate or pleural effusion.  Calcified granuloma seen in the lateral right lung base, as shown on recent abdomen CT images on 08/01/2011.  IMPRESSION: No acute findings or other significant radiographic abnormality.  Original Report Authenticated By: Danae Orleans, M.D.    Scheduled Meds:   . citalopram  30 mg Oral Daily  . clonazePAM  1 mg Oral TID  . docusate sodium  100 mg Oral BID  . feeding supplement  237 mL Oral BID BM  . guaiFENesin  600 mg Oral BID  . ibuprofen  600 mg Oral TID  . imipenem-cilastatin  500 mg Intravenous Q6H  . nicotine  21 mg Transdermal Daily  . pantoprazole  40 mg Oral BID AC  . polyethylene glycol  17 g Oral BID  . senna-docusate  2 tablet Oral Daily  . vancomycin  750 mg Intravenous Q8H   Continuous  Infusions:   . sodium  chloride 100 mL/hr at 10/05/11 0432     Assessment/Plan: 1. Abdominal pain: Secondary constipation. 2. Acute renal failure: Resolved with IV fluids. Secondary to dehydration. 3. UTI: Resolved with 7 days of Levaquin. 4. Suicidal ideation: Involuntary commitment discontinued. Suicide precautions discontinued. No longer suicidal per psychiatry. 5. Bipolar disorder: Citalopram per psychiatrist. 6. Healthcare acquired pneumonia: Patient had pyrexia on morning of 09/30/11, since then, temperature had been between 101-102, and wcc is elevated at 14.2. CXR of 09/30/11, showed findings suspicious for interval development of left lower lobe bronchopneumonia and probable right lower lobe pneumonia. This was confirmed by CT scan findings of the same date, which showed bibasilar patchy airspace disease. Patient was commenced on Vancomycin/Primaxin on 09/30/11, and is now on day# 5. CXR of 10/03/11, showed progression of airspace disease, although patient has remained apyrexial, and wcc has normalized. Patient continues to complain of chest pain. Now on NSAID, and doubled PPI. As patient has not mobilized much during his hospitalization, checked CTA, to rule out PE, masquerading as pneumonia. CTA of 10/03/11, showed No pulmonary embolism, ill-defined ground-glass opacities within the right upper and lower lobe, some of which demonstrate a tree in bud configuration, are worrisome for infection, including atypicals.  7. GERD: Patient complains of epigastric/retrosternal burning discomfort. The symptoms are consistent with GERD. He was started on PPI, on 10/02/11. 8. Substance abuse: Hopeful for inpatient rehabilitation. 9. Kidney stone.  Appears to be improving. Likely stable for discharge one to 2 days.  Code Status: Full code. Family Communication:  Disposition Plan: Hopefully inpatient treatment one to 2 days.   Brendia Sacks, MD  [facility] Pager [phone] 10/05/2011, 3:00 PM    LOS: 16 days

## 2011-10-05 NOTE — Progress Notes (Signed)
Lengthy conversation regarding placement post hospital stay.  Pt really wanting to go to [facility] but concerned about the potential gap in service delivery.  Pt questioning whether he could go to a safe house in the interim and await placement at [facility], should they accept him for inpt tx.  CSW to look into safe houses on Pt's behalf but advised Pt that safe houses/halfway houses typically take Pts who are being d/c'd from tx programs.    Providence Crosby, LCSWA Clinical Social Work [phone]

## 2011-10-05 NOTE — Progress Notes (Signed)
LM for inpt admissions coordinator at [facility], Briant Cedar.  Spoke with Briant Cedar from [facility].  Per Mr. Beverely Risen has an extensive waiting list and that they aren't able to schedule admissions until Feb. 19th.  Mr. Geraldo Pitter stated that he typically refers Pts who do meet criteria to [facility] as a place to await admission.  CSW to continue to follow.  Providence Crosby, LCSWA Clinical Social Work [phone]

## 2011-10-05 NOTE — Progress Notes (Signed)
Spoke with psych MD to receive clarification regarding Pt's voluntary/involuntary status.  Per psych MD, Pt doesn't need to be on IVC status, even though her note from 2/5 states that he does.  Psych MD reports that she was unable to access her note in EPIC from 2/1, where she indicated that IVC and sitter can be discontinued, and so she just carried over recommendations from older notes.  Again, psych MD OK with sitter and IVC status being d/c'd.  Providence Crosby, LCSWA Clinical Social Work [phone]

## 2011-10-05 NOTE — Progress Notes (Signed)
Discovered patient had dressed in street clothes and left the unit via service elevator.  Security alerted and went looking for patient and found him on the first floor wearing street clothes,jacket and tobogan.  Escorted him back to [address] and explained he could not leave the floor.  He returned to his room without issue.

## 2011-10-06 LAB — CULTURE, BLOOD (ROUTINE X 2)
Culture  Setup Time: 201302011430
Culture: NO GROWTH

## 2011-10-06 NOTE — Plan of Care (Signed)
Problem: Problem: Pain Management Progression Goal: Pain controlled Outcome: Progressing Pt getting prns po and iv pain meds. Offered Special educational needs teacher.

## 2011-10-06 NOTE — Progress Notes (Signed)
PROGRESS NOTE  Ian Ruiz ZOX:[id number] DOB: 07-25-1971 DOA: 09/19/2011 PCP: No primary provider on file.  Brief narrative: 41 year old man admitted for abdominal pain. Currently being treated for pneumonia.  Past medical history: Ulcerative colitis, Mnire's disease, bipolar disorder, substance abuse  Consultants:  Psychiatry  Urology  Procedures:    Antibiotics:    Interim History:   Subjective: Feels better today. Less short of breath. Chest pain.  Objective: Filed Vitals:   10/05/11 1403 10/05/11 2229 10/06/11 0700 10/06/11 1435  BP: 110/56 132/84 120/76 132/84  Pulse: 69 59 68 73  Temp: 97.8 F (36.6 C) 98.4 F (36.9 C) 98.3 F (36.8 C) 98.1 F (36.7 C)  TempSrc: Oral Oral Oral Oral  Resp: 18 18 18 20   Height:      Weight:      SpO2: 96% 95% 96% 97%    Intake/Output Summary (Last 24 hours) at 10/06/11 1753 Last data filed at 10/06/11 1713  Gross per 24 hour  Intake   2280 ml  Output      0 ml  Net   2280 ml    Exam:  General: Appears calm and comfortable. Cardiovascular: Regular rate and rhythm. No murmur, rub, gallop. No lower extremity edema. Respiratory: Clear to auscultation bilaterally. No wheezes, rales, rhonchi. Normal respiratory effort  Data Reviewed: Basic Metabolic Panel:  Lab 10/05/11 8119 10/02/11 0558 10/01/11 0454  NA 138 133* 132*  K 4.1 3.9 --  CL 104 99 96  CO2 25 26 25   GLUCOSE 91 112* 114*  BUN 14 13 16   CREATININE 0.97 0.94 1.17  CALCIUM 8.6 9.0 8.8  MG -- -- --  PHOS -- -- --    Lab 10/02/11 0558  LIPASE 21  AMYLASE --   CBC:  Lab 10/05/11 0453 10/02/11 0558 10/01/11 0454 09/30/11 0445  WBC 5.8 5.6 14.2* 8.3  NEUTROABS -- -- -- --  HGB 11.7* 11.1* 12.5* 12.6*  HCT 35.2* 34.9* 37.2* 37.8*  MCV 88.0 88.6 87.7 89.4  PLT 299 226 266 251     Recent Results (from the past 240 hour(s))  CULTURE, BLOOD (ROUTINE X 2)     Status: Normal   Collection Time   09/30/11 12:19 PM      Component Value Range  Status Comment   Specimen Description BLOOD LEFT HAND   Final    Special Requests BOTTLES DRAWN AEROBIC ONLY 4CC   Final    Culture  Setup Time [id number]   Final    Culture NO GROWTH 5 DAYS   Final    Report Status 10/06/2011 FINAL   Final   CULTURE, BLOOD (ROUTINE X 2)     Status: Normal   Collection Time   09/30/11 12:20 PM      Component Value Range Status Comment   Specimen Description BLOOD LEFT HAND   Final    Special Requests BOTTLES DRAWN AEROBIC ONLY 4CC   Final    Culture  Setup Time [id number]   Final    Culture NO GROWTH 5 DAYS   Final    Report Status 10/06/2011 FINAL   Final   URINE CULTURE     Status: Normal   Collection Time   09/30/11  2:30 PM      Component Value Range Status Comment   Specimen Description URINE, CLEAN CATCH   Final    Special Requests NONE   Final    Culture  Setup Time [id number]   Final    Colony Count NO GROWTH  Final    Culture NO GROWTH   Final    Report Status 10/02/2011 FINAL   Final      Studies: Ct Abdomen Pelvis Wo Contrast  09/19/2011  *RADIOLOGY REPORT*  Clinical Data: Lower abdominal and testicular pain.  Dysuria. History of kidney stones.  History of ulcerative colitis.  CT ABDOMEN AND PELVIS WITHOUT CONTRAST  Technique:  Multidetector CT imaging of the abdomen and pelvis was performed following the standard protocol without intravenous contrast.  Comparison: 08/01/2011.  Findings: Since the prior examination, the patient has developed a moderate amount of low density ascites.  The etiology of ascites is indeterminate.  This is not centered around the pancreas to suggest pancreatitis.  Fluid extends into the small bowel mesentery and surrounds the jejunal loops.  It is possible this is related to an enteritis or colitis.  The colon and small bowel do not appear thickened.  Barium is noted entering the appendix which does not appear to be a primary source of the ascites.  Sigmoid colon remains under filled. This may be fortuitous  although stricture not excluded.  Unenhanced imaging without focal liver, splenic, pancreatic, adrenal or left renal lesion.  Nonobstructing 4 mm right renal calculus.  No calcified gallstones.  No free intraperitoneal air.  Prostate gland top normal in size.  Unenhanced imaging of the urinary bladder unremarkable.  Scattered normal to top normal size upper abdominal and retroperitoneal lymph nodes.  Small calcification lower abdominal aorta without evidence of aneurysmal dilation.  No bony destructive lesion.  Peripheral atelectasis lung bases greater on the left.  Granuloma right lung base.  IMPRESSION: Interval development of moderate ascites.  Etiology indeterminate. Please see above discussion.  Original Report Authenticated By: Fuller Canada, M.D.   Dg Chest 2 View  10/03/2011  *RADIOLOGY REPORT*  Clinical Data: Recently diagnosed with pneumonia, now with shortness of breath, coughing and chest discomfort  CHEST - 2 VIEW  Comparison: 09/30/2011; 09/19/2011; CT abdomen pelvis - 09/30/2011  Findings:  Grossly unchanged cardiac silhouette and mediastinal contours. The previously identified bilateral lower lung and left mid lung heterogeneous air space opacities are grossly unchanged compared to recent prior examination.  Interval development of an additional ill-defined airspace opacity within the right mid lung. Nodular opacities overlying the bilateral lower lung favored to represent nipple shadows.  No pleural effusion or pneumothorax.  Unchanged bones.  IMPRESSION: Findings most suggestive of progressive multifocal infection.  A follow-up chest radiograph in 4 to 6 weeks after treatment is recommended to ensure resolution.  Original Report Authenticated By: Waynard Reeds, M.D.   Ct Angio Chest W/cm &/or Wo Cm  10/03/2011  *RADIOLOGY REPORT*  Clinical Data: Chest discomfort, evaluate for pulmonary embolism  CT ANGIOGRAPHY CHEST  Technique:  Multidetector CT imaging of the chest using the standard  protocol during bolus administration of intravenous contrast. Multiplanar reconstructed images including MIPs were obtained and reviewed to evaluate the vascular anatomy.  Contrast: 80mL OMNIPAQUE IOHEXOL 300 MG/ML IV SOLN  Comparison: Chest radiograph - 10/03/2011; 09/30/2011; CT abdomen pelvis - 09/30/2011  Findings:  There is adequate opacification of the pulmonary arteries with the main pulmonary artery measuring 301 HU.  There are no discrete filling defects within the main, right, left, segmental or subsegmental branch vessels of the pulmonary arteries to suggest acute pulmonary embolism.  Scattered heterogeneous predominantly ground-glass opacities primarily within the right upper lobe and superior segment of the right lower lobe, some of which within the anterior segment of the right  upper lobe may have a tree in bud type configuration (image 33 and 35).  Dependent bibasilar heterogeneous opacities are favored to represent subsegmental atelectasis.  There is centrally calcified 9 mm granuloma within the right lower lung (image 49, series 14). Central airways are patent.  No pleural effusion or pneumothorax.  Borderline enlarged right hilar lymph node measures approximate 1.1 cm in greatest short axis diameter (image 118, series six). Subcarinal nodal conglomeration measures approximately 1.5 cm in greatest short axis diameter.  Additional scattered shoddy paratracheal lymph nodes are not enlarged by CT criteria.  Shoddy bilateral axillary lymph nodes are not enlarged by CT criteria.  Normal heart size. No pericardial effusion.  Normal caliber of the thoracic aorta.  Normal configuration of the aortic arch.  There is no periaortic stranding.  Early arterial phase evaluation of the upper abdomen demonstrates a sub centimeter hypoattenuating lesion within the anterior aspect the right lobe of the liver (image 77, series 11) which is too small to accurately characterize.  Incidental note is made of a small  splenule.  High-density pill fragment is noted within the gastric fundus (image 70).  No acute or aggressive osseous abnormalities within the thorax.  IMPRESSION:  1.  No pulmonary embolism.  2.  Ill-defined ground-glass opacities within the right upper and lower lobe, some of which demonstrate a tree in bud configuration, are worrisome for infection, including atypicals.  3.  Borderline enlarged right hilar and shoddy mediastinal lymph nodes are nonspecific, possibly reactive in etiology.  Original Report Authenticated By: Waynard Reeds, M.D.   Ct Abdomen Pelvis W Contrast  09/30/2011  *RADIOLOGY REPORT*  Clinical Data: Abdominal pain and fever  CT ABDOMEN AND PELVIS WITH CONTRAST  Technique:  Multidetector CT imaging of the abdomen and pelvis was performed following the standard protocol during bolus administration of intravenous contrast.  Contrast:  100 ml Isovue 300  Comparison: 09/19/2011  Findings: Heterogeneous opacities have developed at both lung bases worrisome for airspace disease.  Liver, gallbladder, spleen, adrenal glands, pancreas are within normal limits.  Extensive amount of stool throughout the length of the colon.  Tiny hypodensities in the kidneys are stable.  Free fluid has nearly resolved.  There is a tiny amount of residual ascites in the pelvis.  Gas is noted in the bladder right the from recent catheterization.  Unremarkable prostate.  Normal appendix.  IMPRESSION: Extensive stool.  Bibasilar patchy airspace disease.  Original Report Authenticated By: Donavan Burnet, M.D.   US Renal  09/19/2011  *RADIOLOGY REPORT*  Clinical Data: Renal failure.  Gross hematuria.  Elevated white blood count.  RENAL/URINARY TRACT ULTRASOUND COMPLETE  Comparison:  CT scan dated 09/19/2011  Findings:  Right Kidney:  Normal.  9.9 cm in length.  Left Kidney:  Normal.  10.6 cm in length.  Bladder:  Foley catheter in place.  However, the bladder is distended.  Bilateral ureteral jets are visualized.  The tip  of a Foley catheter is adjacent to the dome of the bladder.  IMPRESSION:  1.  Normal kidneys. 2.  Distended urinary bladder.  Foley catheter in place with the tip against the superior wall of the bladder.  Original Report Authenticated By: Gwynn Burly, M.D.   US Abdomen Limited  09/20/2011  *RADIOLOGY REPORT*  LIMITED ABDOMINAL ULTRASOUND  The patient had prior CT scan which stated the patient had moderate ascites.  Ultrasound imaging was performed of all four quadrants of the abdomen and image documentation was obtained.  No ascites  was noted on ultrasound for needle aspiration.  Impression:  Limited abdominal ultrasound revealing no ascites for paracentesis procedure.  Read by: Anselm Pancoast, P.A.-C  Original Report Authenticated By: Waynard Reeds, M.D.   Scheduled Meds:    . citalopram  30 mg Oral Daily  . clonazePAM  1 mg Oral TID  . docusate sodium  100 mg Oral BID  . feeding supplement  237 mL Oral BID BM  . guaiFENesin  600 mg Oral BID  . ibuprofen  600 mg Oral TID  . imipenem-cilastatin  500 mg Intravenous Q6H  . nicotine  21 mg Transdermal Daily  . pantoprazole  40 mg Oral BID AC  . polyethylene glycol  17 g Oral BID  . senna-docusate  2 tablet Oral Daily  . vancomycin  750 mg Intravenous Q8H   Continuous Infusions:    . DISCONTD: sodium chloride 100 mL/hr at 10/05/11 1628     Assessment/Plan: 1. Healthcare acquired pneumonia: Started on Vancomycin/Primaxin on 09/30/11. Complete antibiotics tomorrow morning. No oxygen requirement. Exam unremarkable. 2. Abdominal pain: Secondary to constipation.Acute renal failure: Resolved with IV fluids. Secondary to dehydration. 3. UTI: Resolved with 7 days of Levaquin. 4. Suicidal ideation: Involuntary commitment discontinued. Suicide precautions discontinued. No longer suicidal per psychiatry. 5. Bipolar disorder: Citalopram per psychiatrist. 6. GERD: Patient complains of epigastric/retrosternal burning discomfort. The symptoms  are consistent with GERD. He was started on PPI, on 10/02/11. 7. Substance abuse: Hopeful for inpatient rehabilitation. 8. Kidney stone.  Appears to be improving. Medically stable for discharge February 8.  Code Status: Full code. Family Communication:  Disposition Plan: Hopefully inpatient treatment one to 2 days.   Brendia Sacks, MD  [facility] Pager [phone] 10/06/2011, 5:53 PM    LOS: 17 days

## 2011-10-06 NOTE — Progress Notes (Signed)
Pt asking if I could go ahead and give his dilaudid now and scan it later.  Explained to pt that th med is ordered every 6 hours and needs to be given as ordered.

## 2011-10-06 NOTE — Progress Notes (Signed)
Pt requested pain medication for chest pain. When I walked in I let him know a new order was placed by MD to DC his Dialudid at midnight. He said the MD told him he was going to do that when he saw him the evening.He received 3 pills of oxycodone and as I was putting something in sharps container he spit out one of the oxycodone and hid it under his sheet on his chest. He pulled his cover up to his chin quickly. I noticed something wasn't right so I pulled his cover down and he grabbed the pill quickly and put it in his mouth. He said oh it must have dropped out of my mouth, honestly. I asked him if the MD said when he was going home. He said it would be tomorrow. I asked how he felt about that he said he was ready and that this place was getting pretty old.

## 2011-10-06 NOTE — Progress Notes (Signed)
   CARE MANAGEMENT NOTE 10/06/2011  Patient:  Ian Ruiz, Ian Ruiz   Account Number:  [id number]  Date Initiated:  09/20/2011  Documentation initiated by:  Lanier Clam  Subjective/Objective Assessment:   ADMITTED W/ABD PAIN.IN ROUTE TO [facility] FOR ADMISSION-SUICIDAL IDEATION,& DRUG ABUSE.JX:[id number] ABUSE.     Action/Plan:   PSYCH/INPT REHAB @ D/C.   Anticipated DC Date:  10/06/2011   Anticipated DC Plan:  IP REHAB FACILITY  In-house referral  Clinical Social Worker      DC Planning Services  Medication Assistance      Choice offered to / List presented to:             Status of service:  In process, will continue to follow Medicare Important Message given?   (If response is "NO", the following Medicare IM given date fields will be blank) Date Medicare IM given:   Date Additional Medicare IM given:    Discharge Disposition:    Per UR Regulation:  Reviewed for med. necessity/level of care/duration of stay  Comments:  10/06/11 Elmo Shumard RN,BSN NCM [phone] IMPROVING.FOR CHANGE TO PO ABX SOON.PSYCH CSW-INPT/OTPT SUBSTANCE ABUSE TX. 10/03/11 Colandra Ohanian RN,BSN NCM [phone] PNA,NOTED BOWELS HAVE MOVED.PSYCH HAS CLEARED FOR IVC,NOW RECOMMENDED FOR INPT SUBSTANCE ABUSE TX.CSW FOLLOWING.   09/30/11 Shanen Norris RN,BSN NCM [phone] NOTED T-101.3,LLQ ABD PAIN,URINE STRAINED,PASSED STONE.UROLOGY FOLLOWING.PSYCH SW FOLLOWING FOR STATE AUTH FOR INPT PSYCH,NOTED ON WAIT LIST.  09/27/11 Crickett Abbett RN,BSN NCM [phone] MED STABLE.AWAITING AUTH FROM STATE FOR INPT PSYCH FACILITY.PSYCH SW FOLLOWING DILIGENTLY. 09/23/11 Arius Harnois RN,BSN NCM [phone] CONTINUE TO FOLLOW FOR PROGRESS,& ASST W/D/C NEEDS.PSYCH/PSYCH SW FOLLOWING. D/C PLAN INPT PSYCH.NOTED NOT [facility] APPROPRIATE.AWAITING AUTH FROM THE STATE.  09/20/11 Dalaina Tates RN,BSN NCM [phone] QUALIFIES FOR INDIGENT FUNDS IF NEEDED.

## 2011-10-06 NOTE — Progress Notes (Signed)
   CARE MANAGEMENT NOTE 10/06/2011  Patient:  Delilah Shan   Account Number:  [id number]  Date Initiated:  10/04/2011  Documentation initiated by:  Lanier Clam  Subjective/Objective Assessment:   ADMITTED W/N/V. Bethann Humble. HX:CAD.     Action/Plan:   FROM HOME W/SPOUSE.HAS PCP.   Anticipated DC Date:  10/10/2011   Anticipated DC Plan:  HOME W HOME HEALTH SERVICES         Choice offered to / List presented to:  C-1 Patient           Status of service:  In process, will continue to follow Medicare Important Message given?   (If response is "NO", the following Medicare IM given date fields will be blank) Date Medicare IM given:   Date Additional Medicare IM given:    Discharge Disposition:    Per UR Regulation:  Reviewed for med. necessity/level of care/duration of stay  Comments:  10/06/11 Shatasha Lambing RN,BSN NCM [phone] PT-HH. OT EVAL PEND.HAS RW.HHC LIST PROVIDED FOR PATIENT TO CHOOSE A HHC AGENCY.SHE WILL STAY W/DTR WHO LIVES IN [place],BUT WILL HAVE ADDRESS AVAILABLE TOMORROW.  10/04/11 Caitlynne Harbeck RN,BSN NCM [phone] RECOMMEND PT/OT CONS WHEN MD FEEL MEDICALLY APPROPRIATE.

## 2011-10-06 NOTE — Progress Notes (Signed)
Per MD, Pt may be ready for d/c tomorrow.  Providence Crosby, LCSWA Clinical Social Work [phone]

## 2011-10-06 NOTE — Progress Notes (Addendum)
Pt requested that I turn up the rate on his iv while giving his dilaudid Explained to the pt that I needed a Dr order to change the rate of his iv, and that I had diluted the med and left the IV running while giving the med. Pt states that the pain meds work better when given faster.  Explained to pt that we had to give iv meds slow.  Voices understanding.

## 2011-10-07 MED ORDER — NICOTINE 21 MG/24HR TD PT24: 1.0000 | MEDICATED_PATCH | TRANSDERMAL | Status: DC

## 2011-10-07 MED ORDER — ACETAMINOPHEN 325 MG PO TABS: 650.0000 mg | ORAL_TABLET | Freq: Four times a day (QID) | ORAL | Status: DC | PRN

## 2011-10-07 MED ORDER — CITALOPRAM HYDROBROMIDE 10 MG PO TABS: 30.0000 mg | ORAL_TABLET | Freq: Every day | ORAL | Status: DC

## 2011-10-07 MED ORDER — POLYETHYLENE GLYCOL 3350 17 G PO PACK: 17.0000 g | PACK | Freq: Two times a day (BID) | ORAL | Status: DC

## 2011-10-07 MED ORDER — PANTOPRAZOLE SODIUM 40 MG PO TBEC: 40.0000 mg | DELAYED_RELEASE_TABLET | Freq: Two times a day (BID) | ORAL | Status: DC

## 2011-10-07 MED ORDER — DSS 100 MG PO CAPS: 100.0000 mg | ORAL_CAPSULE | Freq: Two times a day (BID) | ORAL | Status: AC

## 2011-10-07 NOTE — Consult Note (Signed)
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 male.  HPI: Pt took overdose and was adamantly suicidal for many days, during which IVC and sitter were applied.  He later became mentally clearer, stated beenfit from Clergy visits.  IVC and sitter were discontinued when pt requested rehab treatment.  He was anticipating discharge when pneumonia was detected and treated.  Past Medical History  Diagnosis Date  . Ulcerative colitis   . Meniere disease   . Bipolar 1 disorder, depressed   . Substance abuse     History reviewed. No pertinent past surgical history.  History reviewed. No pertinent family history.  Social History:  reports that he has been smoking.  He has never used smokeless tobacco. He reports that he uses illicit drugs (Marijuana and Cocaine). He reports that he does not drink alcohol.  Allergies:  Allergies  Allergen Reactions  . Penicillins Rash    Medications: I have reviewed patient's current medications.   Results for orders placed during the hospital encounter of 09/19/11 (from the past 48 hour(s))  CBC     Status: Abnormal   Collection Time   10/05/11  4:53 AM      Component Value Range Comment   WBC 5.8  4.0 - 10.5 (K/uL)    RBC 4.00 (*) 4.22 - 5.81 (MIL/uL)    Hemoglobin 11.7 (*) 13.0 - 17.0 (g/dL)    HCT 62.1 (*) 30.8 - 52.0 (%)    MCV 88.0  78.0 - 100.0 (fL)    MCH 29.3  26.0 - 34.0 (pg)    MCHC 33.2  30.0 - 36.0 (g/dL)    RDW 65.7  84.6 - 96.2 (%)    Platelets 299  150 - 400 (K/uL)   BASIC METABOLIC PANEL     Status: Normal   Collection Time   10/05/11  4:53 AM      Component Value Range Comment   Sodium 138  135 - 145 (mEq/L)    Potassium 4.1  3.5 - 5.1 (mEq/L)    Chloride 104  96 - 112 (mEq/L)    CO2 25  19 - 32 (mEq/L)    Glucose, Bld 91  70 - 99 (mg/dL)    BUN 14  6 - 23 (mg/dL)    Creatinine, Ser 9.52  0.50 - 1.35 (mg/dL)    Calcium 8.6  8.4 - 10.5 (mg/dL)    GFR calc non Af  Amer >90  >90 (mL/min)    GFR calc Af Amer >90  >90 (mL/min)   VANCOMYCIN, TROUGH     Status: Normal   Collection Time   10/05/11  9:52 AM      Component Value Range Comment   Vancomycin Tr 18.3  10.0 - 20.0 (ug/mL)     No results found.  Review of Systems  Unable to perform ROS: other   Blood pressure 109/72, pulse 62, temperature 97.9 F (36.6 C), temperature source Oral, resp. rate 18, height 5\' 10"  (1.778 m), weight 79.1 kg (174 lb 6.1 oz), SpO2 97.00%. Physical Exam  Assessment/Plan:  Continued monitoring patient's progress.  RECOMMENDATION:  1.  Pt is cognitively intact and goal directed without suicidal 'homicidal ideation.  2. Pt is to transfer to rehab program when medically stable.  3.  No further psychiatric needs are identified; will follow until medically stable and able to be discharged.  MD Psychiatrist  Katori Wirsing 10/07/2011, 1:34 AM

## 2011-10-07 NOTE — Progress Notes (Signed)
   CARE MANAGEMENT NOTE 10/07/2011  Patient:  Ian Ruiz, Ian Ruiz   Account Number:  [id number]  Date Initiated:  09/20/2011  Documentation initiated by:  Lanier Clam  Subjective/Objective Assessment:   ADMITTED W/ABD PAIN.IN ROUTE TO [facility] FOR ADMISSION-SUICIDAL IDEATION,& DRUG ABUSE.ZO:[id number] ABUSE.     Action/Plan:   PSYCH/INPT REHAB @ D/C.   Anticipated DC Date:  10/07/2011   Anticipated DC Plan:  HOME/SELF CARE  In-house referral  Clinical Social Worker      DC Planning Services  Medication Assistance      Choice offered to / List presented to:             Status of service:  Completed, signed off Medicare Important Message given?   (If response is "NO", the following Medicare IM given date fields will be blank) Date Medicare IM given:   Date Additional Medicare IM given:    Discharge Disposition:  HOME/SELF CARE  Per UR Regulation:  Reviewed for med. necessity/level of care/duration of stay  Comments:  10/07/11 [facility] RN,BSN NCM [phone] PROVIDED W/$4 [employer] MED LIST.QUALIFIES FOR INDIGENT FUNDS IF NEEDED.PSYCH CSW-[facility]-OTPT SA TREATMENT.   10/06/11 Glendoris Nodarse RN,BSN NCM [phone] IMPROVING.FOR CHANGE TO PO ABX SOON.PSYCH CSW-INPT/OTPT SUBSTANCE ABUSE TX. 10/03/11 Kyle Stansell RN,BSN NCM [phone] PNA,NOTED BOWELS HAVE MOVED.PSYCH HAS CLEARED FOR IVC,NOW RECOMMENDED FOR INPT SUBSTANCE ABUSE TX.CSW FOLLOWING.   09/30/11 Ileane Sando RN,BSN NCM [phone] NOTED T-101.3,LLQ ABD PAIN,URINE STRAINED,PASSED STONE.UROLOGY FOLLOWING.PSYCH SW FOLLOWING FOR STATE AUTH FOR INPT PSYCH,NOTED ON WAIT LIST.  09/27/11 Shaquoia Miers RN,BSN NCM [phone] MED STABLE.AWAITING AUTH FROM STATE FOR INPT PSYCH FACILITY.PSYCH SW FOLLOWING DILIGENTLY. 09/23/11 Lasean Gorniak RN,BSN NCM [phone] CONTINUE TO FOLLOW FOR PROGRESS,& ASST W/D/C NEEDS.PSYCH/PSYCH SW FOLLOWING. D/C PLAN INPT PSYCH.NOTED NOT [facility] APPROPRIATE.AWAITING AUTH FROM THE STATE.  09/20/11 Elouise Divelbiss RN,BSN NCM [phone] QUALIFIES FOR INDIGENT FUNDS IF NEEDED.

## 2011-10-07 NOTE — Discharge Summary (Signed)
Physician Discharge Summary  Ian Ruiz AVW:[id number] DOB: 1971-08-23 DOA: 09/19/2011  PCP: None Admit date: 09/19/2011 Discharge date: 10/07/2011  Discharge Diagnoses:  1. Healthcare acquired pneumonia 2. Abdominal pain secondary to constipation 3. Acute renal failure, resolved 4. UTI, completely treated 5. Suicidal ideation, resolved, not active. 6. Nephrolithiasis, resolved 7. Bipolar disorder/depression, stable  Discharge Condition: Improved  Disposition: [facility] inpatient rehabilitation for substance abuse  History of present illness:  41 year old man presented to the emergency department with abdominal pain. He had been discharged earlier that same day of admission but came back with a different story stating that he use cocaine and marijuana and that then his stomach start to hurt. He will be discharged to police custody once medically cleared. He also voiced suicidal ideation. Emergency department was found to have acute renal failure as well as some ascites. He was admitted for abdominal pain, acute renal failure, suicidal ideation, polysubstance abuse.  Hospital Course:  Ian Ruiz was admitted to medical floor. He seen by psychiatry and initially placed under involuntary commitment for suicidal ideation. However he subsequently improved and he was cleared by psychiatry for discharge to a substance abuse rehabilitation center. He is not suicidal. He was continued on Celexa on Klonopin per psychiatry. He complained of abdominal pain and was noted to have significant constipation. He was placed on bowel regimen with good effect. He also had a kidney stone passed during this hospitalization. He was seen by urology but no intervention was required. Acute renal failure resolved with supportive care and IV fluids and requires no further evaluation. Urinary tract infection has resolved. During his hospital stay he developed fever and was treated for healthcare acquired pneumonia. This has  resolved. Patient had no oxygen requirement and stable for discharge. 1. Healthcare acquired pneumonia: Completed antibiotic therapy. No oxygen requirement. Exam unremarkable.  2. Abdominal pain: Secondary to constipation. Resolved. Bowel regimen.  3. Acute renal failure: Resolved with IV fluids. Secondary to dehydration.  4. UTI: Resolved with 7 days of Levaquin.  5. Suicidal ideation: Involuntary commitment discontinued. Suicide precautions discontinued. No longer suicidal per psychiatry.  6. Bipolar disorder: Citalopram per psychiatrist.  7. GERD: Patient complains of epigastric/retrosternal burning discomfort. The symptoms are consistent with GERD.  8. Substance abuse: Inpatient rehabilitation.  9. Kidney stone: Passed during this hospitalization on September 29.  10. Severe malnutrition in context of acute illness. Patient not interested in supplements.  Patient was noted be manipulative and inappropriate with staff. Displays narcotic seeking behavior.  Consultants:  Psychiatry   Urology  Procedures:  None  Antibiotics:  February 1-7: Vancomycin/Primaxin  Discharge Instructions  Discharge Orders    Future Orders Please Complete By Expires   Diet general      Activity as tolerated - No restrictions        Medication List  As of 10/07/2011  3:56 PM   TAKE these medications         acetaminophen 325 MG tablet   Commonly known as: TYLENOL   Take 2 tablets (650 mg total) by mouth every 6 (six) hours as needed (or Fever >/= 101).      citalopram 10 MG tablet   Commonly known as: CELEXA   Take 3 tablets (30 mg total) by mouth daily.      clonazePAM 1 MG tablet   Commonly known as: KLONOPIN   Take 1 tablet (1 mg total) by mouth 3 (three) times daily.      DSS 100 MG Caps   Take 100  mg by mouth 2 (two) times daily.      nicotine 21 mg/24hr patch   Commonly known as: NICODERM CQ - dosed in mg/24 hours   Place 1 patch onto the skin daily.      pantoprazole 40 MG  tablet   Commonly known as: PROTONIX   Take 1 tablet (40 mg total) by mouth 2 (two) times daily before a meal.      polyethylene glycol packet   Commonly known as: MIRALAX / GLYCOLAX   Take 17 g by mouth 2 (two) times daily.           Follow-up Information    Follow up with [facility] on 10/31/2011. (9:30A)    Contact information:   [address] [place] [address] [phone]          The results of significant diagnostics from this hospitalization (including imaging, microbiology, ancillary and laboratory) are listed below for reference.    Significant Diagnostic Studies: Ct Abdomen Pelvis Wo Contrast  09/19/2011  *RADIOLOGY REPORT*  Clinical Data: Lower abdominal and testicular pain.  Dysuria. History of kidney stones.  History of ulcerative colitis.  CT ABDOMEN AND PELVIS WITHOUT CONTRAST  Technique:  Multidetector CT imaging of the abdomen and pelvis was performed following the standard protocol without intravenous contrast.  Comparison: 08/01/2011.  Findings: Since the prior examination, the patient has developed a moderate amount of low density ascites.  The etiology of ascites is indeterminate.  This is not centered around the pancreas to suggest pancreatitis.  Fluid extends into the small bowel mesentery and surrounds the jejunal loops.  It is possible this is related to an enteritis or colitis.  The colon and small bowel do not appear thickened.  Barium is noted entering the appendix which does not appear to be a primary source of the ascites.  Sigmoid colon remains under filled. This may be fortuitous although stricture not excluded.  Unenhanced imaging without focal liver, splenic, pancreatic, adrenal or left renal lesion.  Nonobstructing 4 mm right renal calculus.  No calcified gallstones.  No free intraperitoneal air.  Prostate gland top normal in size.  Unenhanced imaging of the urinary bladder unremarkable.  Scattered normal to top normal size upper abdominal and  retroperitoneal lymph nodes.  Small calcification lower abdominal aorta without evidence of aneurysmal dilation.  No bony destructive lesion.  Peripheral atelectasis lung bases greater on the left.  Granuloma right lung base.  IMPRESSION: Interval development of moderate ascites.  Etiology indeterminate. Please see above discussion.  Original Report Authenticated By: Fuller Canada, M.D.   Dg Chest 2 View  10/03/2011  *RADIOLOGY REPORT*  Clinical Data: Recently diagnosed with pneumonia, now with shortness of breath, coughing and chest discomfort  CHEST - 2 VIEW  Comparison: 09/30/2011; 09/19/2011; CT abdomen pelvis - 09/30/2011  Findings:  Grossly unchanged cardiac silhouette and mediastinal contours. The previously identified bilateral lower lung and left mid lung heterogeneous air space opacities are grossly unchanged compared to recent prior examination.  Interval development of an additional ill-defined airspace opacity within the right mid lung. Nodular opacities overlying the bilateral lower lung favored to represent nipple shadows.  No pleural effusion or pneumothorax.  Unchanged bones.  IMPRESSION: Findings most suggestive of progressive multifocal infection.  A follow-up chest radiograph in 4 to 6 weeks after treatment is recommended to ensure resolution.  Original Report Authenticated By: Waynard Reeds, M.D.   Dg Chest 2 View  09/30/2011  *RADIOLOGY REPORT*  Clinical Data: Shortness  of breath with chest pain and coughing for several weeks with yellow blood-tinged mucus.  No hypertension. Fever.  Nonsmoker  CHEST - 2 VIEW  Comparison: 09/19/2011  Findings: Heart and mediastinal contours are within normal limits. Persistent left basilar linear scarring or subsegmental atelectasis is seen.  There has been interval development of alveolar infiltrate involving the posterior and lateral segments of the left lower lobe and questionable in the posterior right lower lobe. This finding is suspicious for areas  of focal bronchopneumonia.  No pleural fluid or signs of congestive failure are seen.  Bony structures appear intact.  IMPRESSION: Findings suspicious for interval development of left lower lobe bronchopneumonia and probable right lower lobe pneumonia.  This result was called to the floor and reported to the patient's nurse, Ester at 2:55 pm.  Original Report Authenticated By: Bertha Stakes, M.D.   Ct Angio Chest W/cm &/or Wo Cm  10/03/2011  *RADIOLOGY REPORT*  Clinical Data: Chest discomfort, evaluate for pulmonary embolism  CT ANGIOGRAPHY CHEST  Technique:  Multidetector CT imaging of the chest using the standard protocol during bolus administration of intravenous contrast. Multiplanar reconstructed images including MIPs were obtained and reviewed to evaluate the vascular anatomy.  Contrast: 80mL OMNIPAQUE IOHEXOL 300 MG/ML IV SOLN  Comparison: Chest radiograph - 10/03/2011; 09/30/2011; CT abdomen pelvis - 09/30/2011  Findings:  There is adequate opacification of the pulmonary arteries with the main pulmonary artery measuring 301 HU.  There are no discrete filling defects within the main, right, left, segmental or subsegmental branch vessels of the pulmonary arteries to suggest acute pulmonary embolism.  Scattered heterogeneous predominantly ground-glass opacities primarily within the right upper lobe and superior segment of the right lower lobe, some of which within the anterior segment of the right upper lobe may have a tree in bud type configuration (image 33 and 35).  Dependent bibasilar heterogeneous opacities are favored to represent subsegmental atelectasis.  There is centrally calcified 9 mm granuloma within the right lower lung (image 49, series 14). Central airways are patent.  No pleural effusion or pneumothorax.  Borderline enlarged right hilar lymph node measures approximate 1.1 cm in greatest short axis diameter (image 118, series six). Subcarinal nodal conglomeration measures approximately  1.5 cm in greatest short axis diameter.  Additional scattered shoddy paratracheal lymph nodes are not enlarged by CT criteria.  Shoddy bilateral axillary lymph nodes are not enlarged by CT criteria.  Normal heart size. No pericardial effusion.  Normal caliber of the thoracic aorta.  Normal configuration of the aortic arch.  There is no periaortic stranding.  Early arterial phase evaluation of the upper abdomen demonstrates a sub centimeter hypoattenuating lesion within the anterior aspect the right lobe of the liver (image 77, series 11) which is too small to accurately characterize.  Incidental note is made of a small splenule.  High-density pill fragment is noted within the gastric fundus (image 70).  No acute or aggressive osseous abnormalities within the thorax.  IMPRESSION:  1.  No pulmonary embolism.  2.  Ill-defined ground-glass opacities within the right upper and lower lobe, some of which demonstrate a tree in bud configuration, are worrisome for infection, including atypicals.  3.  Borderline enlarged right hilar and shoddy mediastinal lymph nodes are nonspecific, possibly reactive in etiology.  Original Report Authenticated By: Waynard Reeds, M.D.   Ct Abdomen Pelvis W Contrast  09/30/2011  *RADIOLOGY REPORT*  Clinical Data: Abdominal pain and fever  CT ABDOMEN AND PELVIS WITH CONTRAST  Technique:  Multidetector CT imaging of the abdomen and pelvis was performed following the standard protocol during bolus administration of intravenous contrast.  Contrast:  100 ml Isovue 300  Comparison: 09/19/2011  Findings: Heterogeneous opacities have developed at both lung bases worrisome for airspace disease.  Liver, gallbladder, spleen, adrenal glands, pancreas are within normal limits.  Extensive amount of stool throughout the length of the colon.  Tiny hypodensities in the kidneys are stable.  Free fluid has nearly resolved.  There is a tiny amount of residual ascites in the pelvis.  Gas is noted in the  bladder right the from recent catheterization.  Unremarkable prostate.  Normal appendix.  IMPRESSION: Extensive stool.  Bibasilar patchy airspace disease.  Original Report Authenticated By: Donavan Burnet, M.D.   US Renal  09/19/2011  *RADIOLOGY REPORT*  Clinical Data: Renal failure.  Gross hematuria.  Elevated white blood count.  RENAL/URINARY TRACT ULTRASOUND COMPLETE  Comparison:  CT scan dated 09/19/2011  Findings:  Right Kidney:  Normal.  9.9 cm in length.  Left Kidney:  Normal.  10.6 cm in length.  Bladder:  Foley catheter in place.  However, the bladder is distended.  Bilateral ureteral jets are visualized.  The tip of a Foley catheter is adjacent to the dome of the bladder.  IMPRESSION:  1.  Normal kidneys. 2.  Distended urinary bladder.  Foley catheter in place with the tip against the superior wall of the bladder.  Original Report Authenticated By: Gwynn Burly, M.D.   US Abdomen Limited  09/20/2011  *RADIOLOGY REPORT*  LIMITED ABDOMINAL ULTRASOUND  The patient had prior CT scan which stated the patient had moderate ascites.  Ultrasound imaging was performed of all four quadrants of the abdomen and image documentation was obtained.  No ascites was noted on ultrasound for needle aspiration.  Impression:  Limited abdominal ultrasound revealing no ascites for paracentesis procedure.  Read by: Anselm Pancoast, P.A.-C  Original Report Authenticated By: Waynard Reeds, M.D.   Portable Chest 1 View  09/19/2011  *RADIOLOGY REPORT*  Clinical Data: Shortness of breath.  PORTABLE CHEST - 1 VIEW  Comparison: None.  Findings: Minimal linear atelectasis is seen in the periphery of the left lung base.  Lungs otherwise clear.  Heart size normal.  No pneumothorax or effusion.  No focal bony abnormality.  IMPRESSION: No acute disease.  Original Report Authenticated By: Bernadene Bell. D'ALESSIO, M.D.   Dg Abd Acute W/chest  09/08/2011  *RADIOLOGY REPORT*  Clinical Data: Right lower quadrant pain. Ulcerative  colitis.  ACUTE ABDOMEN SERIES (ABDOMEN 2 VIEW & CHEST 1 VIEW)  Comparison: None.  Findings: The bowel gas pattern is within normal limits.  No evidence of air-fluid levels or free air.  Heart size is normal.  Mild central peribronchial thickening is noted, however there is no evidence of pulmonary infiltrate or pleural effusion.  Calcified granuloma seen in the lateral right lung base, as shown on recent abdomen CT images on 08/01/2011.  IMPRESSION: No acute findings or other significant radiographic abnormality.  Original Report Authenticated By: Danae Orleans, M.D.    Microbiology: Recent Results (from the past 240 hour(s))  CULTURE, BLOOD (ROUTINE X 2)     Status: Normal   Collection Time   09/30/11 12:19 PM      Component Value Range Status Comment   Specimen Description BLOOD LEFT HAND   Final    Special Requests BOTTLES DRAWN AEROBIC ONLY 4CC   Final    Culture  Setup Time [id number]  Final    Culture NO GROWTH 5 DAYS   Final    Report Status 10/06/2011 FINAL   Final   CULTURE, BLOOD (ROUTINE X 2)     Status: Normal   Collection Time   09/30/11 12:20 PM      Component Value Range Status Comment   Specimen Description BLOOD LEFT HAND   Final    Special Requests BOTTLES DRAWN AEROBIC ONLY 4CC   Final    Culture  Setup Time [id number]   Final    Culture NO GROWTH 5 DAYS   Final    Report Status 10/06/2011 FINAL   Final   URINE CULTURE     Status: Normal   Collection Time   09/30/11  2:30 PM      Component Value Range Status Comment   Specimen Description URINE, CLEAN CATCH   Final    Special Requests NONE   Final    Culture  Setup Time [id number]   Final    Colony Count NO GROWTH   Final    Culture NO GROWTH   Final    Report Status 10/02/2011 FINAL   Final      Labs: Basic Metabolic Panel:  Lab 10/05/11 2130 10/02/11 0558 10/01/11 0454  NA 138 133* 132*  K 4.1 3.9 --  CL 104 99 96  CO2 25 26 25   GLUCOSE 91 112* 114*  BUN 14 13 16   CREATININE 0.97 0.94 1.17  CALCIUM  8.6 9.0 8.8  MG -- -- --  PHOS -- -- --    Lab 10/02/11 0558  LIPASE 21  AMYLASE --   CBC:  Lab 10/05/11 0453 10/02/11 0558 10/01/11 0454  WBC 5.8 5.6 14.2*  NEUTROABS -- -- --  HGB 11.7* 11.1* 12.5*  HCT 35.2* 34.9* 37.2*  MCV 88.0 88.6 87.7  PLT 299 226 266    Time coordinating discharge: 35 minutes.  Signed:  Brendia Sacks, MD  [facility] 10/07/2011, 3:52 PM

## 2011-10-07 NOTE — Progress Notes (Signed)
While patient was taking his Oxy IR he pretended to take the medication and actually dumped the 3 pills under his covers.  RN called him out on this, and he pretended to not know what I was talking about.  I pulled the pills out from under the covers and made him swallow them 1 at a time.

## 2011-10-07 NOTE — Progress Notes (Signed)
Confirmed receipt of Pt's information by [facility] rep.  CSW to continue to follow.  Providence Crosby, LCSWA Clinical Social Work [phone]

## 2011-10-07 NOTE — Progress Notes (Signed)
PROGRESS NOTE  Ian Ruiz WUJ:[id number] DOB: July 31, 1971 DOA: 09/19/2011 PCP: No primary provider on file.  Brief narrative: 41 year old man presented to the emergency department with abdominal pain. He had been discharged earlier that same day of admission but came back with a different story stating that he use cocaine and marijuana and that then his stomach start to hurt. He will be discharged to police custody once medically cleared. He also voiced suicidal ideation. Emergency department was found to have acute renal failure as well as some ascites. He was admitted for abdominal pain, acute renal failure, suicidal ideation, polysubstance abuse.  Past medical history: Ulcerative colitis, Mnire's disease, bipolar disorder, substance abuse, history of overdose on Klonopin January 17.  Consultants:  Psychiatry  Urology  Procedures:  None  Antibiotics:  February 1-7: Vancomycin/Primaxin  Interim History: Chart reviewed in detail. Has been hiding his oral narcotics in his bed.  Subjective: Overall doing okay.  Objective: Filed Vitals:   10/06/11 2037 10/07/11 0704 10/07/11 0900 10/07/11 1429  BP: 109/72 119/75  121/83  Pulse: 62 57  66  Temp: 97.9 F (36.6 C) 98 F (36.7 C)  97.6 F (36.4 C)  TempSrc: Oral Oral  Oral  Resp: 18 16  16   Height:      Weight:   78.3 kg (172 lb 9.9 oz)   SpO2: 97% 98%  97%    Intake/Output Summary (Last 24 hours) at 10/07/11 1518 Last data filed at 10/07/11 0752  Gross per 24 hour  Intake   1300 ml  Output      3 ml  Net   1297 ml    Exam:  General: Appears calm and comfortable. Cardiovascular: Regular rate and rhythm. No murmur, rub, gallop. No lower extremity edema. Respiratory: Clear to auscultation bilaterally. No wheezes, rales, rhonchi. Normal respiratory effort  Data Reviewed: Basic Metabolic Panel:  Lab 10/05/11 9562 10/02/11 0558 10/01/11 0454  NA 138 133* 132*  K 4.1 3.9 --  CL 104 99 96  CO2 25 26 25     GLUCOSE 91 112* 114*  BUN 14 13 16   CREATININE 0.97 0.94 1.17  CALCIUM 8.6 9.0 8.8  MG -- -- --  PHOS -- -- --    Lab 10/02/11 0558  LIPASE 21  AMYLASE --   CBC:  Lab 10/05/11 0453 10/02/11 0558 10/01/11 0454  WBC 5.8 5.6 14.2*  NEUTROABS -- -- --  HGB 11.7* 11.1* 12.5*  HCT 35.2* 34.9* 37.2*  MCV 88.0 88.6 87.7  PLT 299 226 266     Recent Results (from the past 240 hour(s))  CULTURE, BLOOD (ROUTINE X 2)     Status: Normal   Collection Time   09/30/11 12:19 PM      Component Value Range Status Comment   Specimen Description BLOOD LEFT HAND   Final    Special Requests BOTTLES DRAWN AEROBIC ONLY 4CC   Final    Culture  Setup Time [id number]   Final    Culture NO GROWTH 5 DAYS   Final    Report Status 10/06/2011 FINAL   Final   CULTURE, BLOOD (ROUTINE X 2)     Status: Normal   Collection Time   09/30/11 12:20 PM      Component Value Range Status Comment   Specimen Description BLOOD LEFT HAND   Final    Special Requests BOTTLES DRAWN AEROBIC ONLY 4CC   Final    Culture  Setup Time [id number]   Final    Culture NO  GROWTH 5 DAYS   Final    Report Status 10/06/2011 FINAL   Final   URINE CULTURE     Status: Normal   Collection Time   09/30/11  2:30 PM      Component Value Range Status Comment   Specimen Description URINE, CLEAN CATCH   Final    Special Requests NONE   Final    Culture  Setup Time [id number]   Final    Colony Count NO GROWTH   Final    Culture NO GROWTH   Final    Report Status 10/02/2011 FINAL   Final      Studies: Ct Abdomen Pelvis Wo Contrast  09/19/2011  *RADIOLOGY REPORT*  Clinical Data: Lower abdominal and testicular pain.  Dysuria. History of kidney stones.  History of ulcerative colitis.  CT ABDOMEN AND PELVIS WITHOUT CONTRAST  Technique:  Multidetector CT imaging of the abdomen and pelvis was performed following the standard protocol without intravenous contrast.  Comparison: 08/01/2011.  Findings: Since the prior examination, the patient  has developed a moderate amount of low density ascites.  The etiology of ascites is indeterminate.  This is not centered around the pancreas to suggest pancreatitis.  Fluid extends into the small bowel mesentery and surrounds the jejunal loops.  It is possible this is related to an enteritis or colitis.  The colon and small bowel do not appear thickened.  Barium is noted entering the appendix which does not appear to be a primary source of the ascites.  Sigmoid colon remains under filled. This may be fortuitous although stricture not excluded.  Unenhanced imaging without focal liver, splenic, pancreatic, adrenal or left renal lesion.  Nonobstructing 4 mm right renal calculus.  No calcified gallstones.  No free intraperitoneal air.  Prostate gland top normal in size.  Unenhanced imaging of the urinary bladder unremarkable.  Scattered normal to top normal size upper abdominal and retroperitoneal lymph nodes.  Small calcification lower abdominal aorta without evidence of aneurysmal dilation.  No bony destructive lesion.  Peripheral atelectasis lung bases greater on the left.  Granuloma right lung base.  IMPRESSION: Interval development of moderate ascites.  Etiology indeterminate. Please see above discussion.  Original Report Authenticated By: Fuller Canada, M.D.   Dg Chest 2 View  10/03/2011  *RADIOLOGY REPORT*  Clinical Data: Recently diagnosed with pneumonia, now with shortness of breath, coughing and chest discomfort  CHEST - 2 VIEW  Comparison: 09/30/2011; 09/19/2011; CT abdomen pelvis - 09/30/2011  Findings:  Grossly unchanged cardiac silhouette and mediastinal contours. The previously identified bilateral lower lung and left mid lung heterogeneous air space opacities are grossly unchanged compared to recent prior examination.  Interval development of an additional ill-defined airspace opacity within the right mid lung. Nodular opacities overlying the bilateral lower lung favored to represent nipple shadows.   No pleural effusion or pneumothorax.  Unchanged bones.  IMPRESSION: Findings most suggestive of progressive multifocal infection.  A follow-up chest radiograph in 4 to 6 weeks after treatment is recommended to ensure resolution.  Original Report Authenticated By: Waynard Reeds, M.D.   Ct Angio Chest W/cm &/or Wo Cm  10/03/2011  *RADIOLOGY REPORT*  Clinical Data: Chest discomfort, evaluate for pulmonary embolism  CT ANGIOGRAPHY CHEST  Technique:  Multidetector CT imaging of the chest using the standard protocol during bolus administration of intravenous contrast. Multiplanar reconstructed images including MIPs were obtained and reviewed to evaluate the vascular anatomy.  Contrast: 80mL OMNIPAQUE IOHEXOL 300 MG/ML IV SOLN  Comparison:  Chest radiograph - 10/03/2011; 09/30/2011; CT abdomen pelvis - 09/30/2011  Findings:  There is adequate opacification of the pulmonary arteries with the main pulmonary artery measuring 301 HU.  There are no discrete filling defects within the main, right, left, segmental or subsegmental branch vessels of the pulmonary arteries to suggest acute pulmonary embolism.  Scattered heterogeneous predominantly ground-glass opacities primarily within the right upper lobe and superior segment of the right lower lobe, some of which within the anterior segment of the right upper lobe may have a tree in bud type configuration (image 33 and 35).  Dependent bibasilar heterogeneous opacities are favored to represent subsegmental atelectasis.  There is centrally calcified 9 mm granuloma within the right lower lung (image 49, series 14). Central airways are patent.  No pleural effusion or pneumothorax.  Borderline enlarged right hilar lymph node measures approximate 1.1 cm in greatest short axis diameter (image 118, series six). Subcarinal nodal conglomeration measures approximately 1.5 cm in greatest short axis diameter.  Additional scattered shoddy paratracheal lymph nodes are not enlarged by CT  criteria.  Shoddy bilateral axillary lymph nodes are not enlarged by CT criteria.  Normal heart size. No pericardial effusion.  Normal caliber of the thoracic aorta.  Normal configuration of the aortic arch.  There is no periaortic stranding.  Early arterial phase evaluation of the upper abdomen demonstrates a sub centimeter hypoattenuating lesion within the anterior aspect the right lobe of the liver (image 77, series 11) which is too small to accurately characterize.  Incidental note is made of a small splenule.  High-density pill fragment is noted within the gastric fundus (image 70).  No acute or aggressive osseous abnormalities within the thorax.  IMPRESSION:  1.  No pulmonary embolism.  2.  Ill-defined ground-glass opacities within the right upper and lower lobe, some of which demonstrate a tree in bud configuration, are worrisome for infection, including atypicals.  3.  Borderline enlarged right hilar and shoddy mediastinal lymph nodes are nonspecific, possibly reactive in etiology.  Original Report Authenticated By: Waynard Reeds, M.D.   Ct Abdomen Pelvis W Contrast  09/30/2011  *RADIOLOGY REPORT*  Clinical Data: Abdominal pain and fever  CT ABDOMEN AND PELVIS WITH CONTRAST  Technique:  Multidetector CT imaging of the abdomen and pelvis was performed following the standard protocol during bolus administration of intravenous contrast.  Contrast:  100 ml Isovue 300  Comparison: 09/19/2011  Findings: Heterogeneous opacities have developed at both lung bases worrisome for airspace disease.  Liver, gallbladder, spleen, adrenal glands, pancreas are within normal limits.  Extensive amount of stool throughout the length of the colon.  Tiny hypodensities in the kidneys are stable.  Free fluid has nearly resolved.  There is a tiny amount of residual ascites in the pelvis.  Gas is noted in the bladder right the from recent catheterization.  Unremarkable prostate.  Normal appendix.  IMPRESSION: Extensive stool.   Bibasilar patchy airspace disease.  Original Report Authenticated By: Donavan Burnet, M.D.   US Renal  09/19/2011  *RADIOLOGY REPORT*  Clinical Data: Renal failure.  Gross hematuria.  Elevated white blood count.  RENAL/URINARY TRACT ULTRASOUND COMPLETE  Comparison:  CT scan dated 09/19/2011  Findings:  Right Kidney:  Normal.  9.9 cm in length.  Left Kidney:  Normal.  10.6 cm in length.  Bladder:  Foley catheter in place.  However, the bladder is distended.  Bilateral ureteral jets are visualized.  The tip of a Foley catheter is adjacent to the dome of the  bladder.  IMPRESSION:  1.  Normal kidneys. 2.  Distended urinary bladder.  Foley catheter in place with the tip against the superior wall of the bladder.  Original Report Authenticated By: Gwynn Burly, M.D.   US Abdomen Limited  09/20/2011  *RADIOLOGY REPORT*  LIMITED ABDOMINAL ULTRASOUND  The patient had prior CT scan which stated the patient had moderate ascites.  Ultrasound imaging was performed of all four quadrants of the abdomen and image documentation was obtained.  No ascites was noted on ultrasound for needle aspiration.  Impression:  Limited abdominal ultrasound revealing no ascites for paracentesis procedure.  Read by: Anselm Pancoast, P.A.-C  Original Report Authenticated By: Waynard Reeds, M.D.   Scheduled Meds:    . citalopram  30 mg Oral Daily  . clonazePAM  1 mg Oral TID  . docusate sodium  100 mg Oral BID  . feeding supplement  237 mL Oral BID BM  . guaiFENesin  600 mg Oral BID  . ibuprofen  600 mg Oral TID  . imipenem-cilastatin  500 mg Intravenous Q6H  . nicotine  21 mg Transdermal Daily  . pantoprazole  40 mg Oral BID AC  . polyethylene glycol  17 g Oral BID  . senna-docusate  2 tablet Oral Daily  . vancomycin  750 mg Intravenous Q8H   Continuous Infusions:     Assessment/Plan: 1. Healthcare acquired pneumonia: Completed antibiotic therapy. No oxygen requirement. Exam unremarkable. 2. Abdominal pain:  Secondary to constipation. Resolved. Bowel regimen. 3. Acute renal failure: Resolved with IV fluids. Secondary to dehydration. 4. UTI: Resolved with 7 days of Levaquin. 5. Suicidal ideation: Involuntary commitment discontinued. Suicide precautions discontinued. No longer suicidal per psychiatry. 6. Bipolar disorder: Citalopram per psychiatrist. 7. GERD: Patient complains of epigastric/retrosternal burning discomfort. The symptoms are consistent with GERD. He was started on PPI, on 10/02/11. 8. Substance abuse: Inpatient rehabilitation. 9. Kidney stone: Passed during this hospitalization on September 29. 10. Severe malnutrition in context of acute illness. Patient not interested in supplements.  Patient was noted be manipulative and inappropriate with staff. Displays narcotic seeking behavior.  Stable for discharge.  Code Status: Full code. Family Communication:     Brendia Sacks, MD  [facility] Pager [phone] 10/07/2011, 3:18 PM    LOS: 18 days

## 2011-10-07 NOTE — Progress Notes (Signed)
Patient is ready for DC to inpatient rehab facility.  While waiting on the paperwork to be finalized, RN offered for the patient to go ahead and get pain meds, get his IV out, and get a shower.  Patient was informed that they would be here to get him at 1600 on the dot and that this was his last offer to get a shower before they get here to pick him up.  Patient refused.  At 1550 when RN went in to give DC instructions and Rx's, patient asked to get a shower.  RN told him no, that his ride was here to get him and that his shower opportunity has already passed.  Patient received 15mg  of Oxy IR and 25mg  benadryl prior to DC.  Patient was given all of his belongings and clothing out of the utility closet.   All belongings sent

## 2011-10-07 NOTE — Progress Notes (Signed)
   CARE MANAGEMENT NOTE 10/07/2011  Patient:  Ian Ruiz, Ian Ruiz   Account Number:  [id number]  Date Initiated:  09/20/2011  Documentation initiated by:  Lanier Clam  Subjective/Objective Assessment:   ADMITTED W/ABD PAIN.IN ROUTE TO [facility] FOR ADMISSION-SUICIDAL IDEATION,& DRUG ABUSE.AV:[id number]/[id number] ABUSE.     Action/Plan:   PSYCH/INPT REHAB @ D/C.   Anticipated DC Date:  10/07/2011   Anticipated DC Plan:  HOME/SELF CARE  In-house referral  Clinical Social Worker      DC Associate Professor  Medication Assistance  [facility]      Choice offered to / List presented to:             Status of service:  Completed, signed off Medicare Important Message given?   (If response is "NO", the following Medicare IM given date fields will be blank) Date Medicare IM given:   Date Additional Medicare IM given:    Discharge Disposition:  HOME/SELF CARE  Per UR Regulation:  Reviewed for med. necessity/level of care/duration of stay  Comments:  10/07/11 Jvon Meroney RN,BSN NCM [phone] UPDATE:[facility] ELIGIBILITY/PCP APPT SET.INFO GIVEN. PROVIDED W/$4 [facility] MED LIST.QUALIFIES FOR INDIGENT FUNDS IF NEEDED.PSYCH CSW-[facility]-OTPT SA TREATMENT.   10/06/11 Sharonda Llamas RN,BSN NCM [phone] IMPROVING.FOR CHANGE TO PO ABX SOON.PSYCH CSW-INPT/OTPT SUBSTANCE ABUSE TX. 10/03/11 Torsha Lemus RN,BSN NCM [phone] PNA,NOTED BOWELS HAVE MOVED.PSYCH HAS CLEARED FOR IVC,NOW RECOMMENDED FOR INPT SUBSTANCE ABUSE TX.CSW FOLLOWING.   09/30/11 Shania Bjelland RN,BSN NCM [phone] NOTED T-101.3,LLQ ABD PAIN,URINE STRAINED,PASSED STONE.UROLOGY FOLLOWING.PSYCH SW FOLLOWING FOR STATE AUTH FOR INPT PSYCH,NOTED ON WAIT LIST.  09/27/11 Odessia Asleson RN,BSN NCM [phone] MED STABLE.AWAITING AUTH FROM STATE FOR INPT PSYCH FACILITY.PSYCH SW FOLLOWING DILIGENTLY. 09/23/11 Joycelynn Fritsche RN,BSN NCM [phone] CONTINUE TO FOLLOW FOR PROGRESS,& ASST W/D/C NEEDS.PSYCH/PSYCH SW FOLLOWING. D/C PLAN  INPT PSYCH.NOTED NOT [facility] APPROPRIATE.AWAITING AUTH FROM THE STATE.  09/20/11 Jazlyn Tippens RN,BSN NCM [phone] QUALIFIES FOR INDIGENT FUNDS IF NEEDED.

## 2011-10-07 NOTE — Progress Notes (Signed)
Chaplain spoke with [facility] re: pt's belongings.   Coordinator of Emergency Shelter to call chaplain and coordinate pt receiving his belongings.  If pt discharges before belongings can be delivered, chaplain will refer to [facility].

## 2011-10-07 NOTE — Progress Notes (Signed)
CSW answered [facility]'s pre-screen questions.  [facility] to pre-screen Pt.  Notified Pt that [facility] will be calling to pre-screen Pt.  Notified by [facility] that Pt has been accepted and that he will need to go through a 3-5 day detox from the Klonopin before he can go into the tx program.  Per Vernona Rieger, RN at [facility], Pt understands this and is in agreement.  [facility] driver will pick Pt up at 1600.  Notified Care Coordinator, Pt and MD.  Pt to be d/c'd.  Providence Crosby, LCSWA Clinical Social Work [phone]

## 2011-10-07 NOTE — Progress Notes (Signed)
Per CM, Pt medically cleared for D/c.  Faxed Pt information to [facility].  Providence Crosby, LCSWA Clinical Social Work [phone]

## 2011-10-11 ENCOUNTER — Encounter (HOSPITAL_COMMUNITY): Payer: Self-pay

## 2011-10-11 ENCOUNTER — Emergency Department (HOSPITAL_COMMUNITY)
Admission: EM | Admit: 2011-10-11 | Discharge: 2011-10-11 | Disposition: A | Discharge status: Home / Self Care | Payer: Self-pay | Attending: Emergency Medicine | Admitting: Emergency Medicine

## 2011-10-11 ENCOUNTER — Emergency Department (HOSPITAL_COMMUNITY): Payer: Self-pay

## 2011-10-11 DIAGNOSIS — Z79899 Other long term (current) drug therapy: Secondary | ICD-10-CM | POA: Insufficient documentation

## 2011-10-11 DIAGNOSIS — R079 Chest pain, unspecified: Secondary | ICD-10-CM | POA: Insufficient documentation

## 2011-10-11 DIAGNOSIS — R05 Cough: Secondary | ICD-10-CM | POA: Insufficient documentation

## 2011-10-11 DIAGNOSIS — R61 Generalized hyperhidrosis: Secondary | ICD-10-CM | POA: Insufficient documentation

## 2011-10-11 DIAGNOSIS — F319 Bipolar disorder, unspecified: Secondary | ICD-10-CM | POA: Insufficient documentation

## 2011-10-11 DIAGNOSIS — R059 Cough, unspecified: Secondary | ICD-10-CM | POA: Insufficient documentation

## 2011-10-11 DIAGNOSIS — R509 Fever, unspecified: Secondary | ICD-10-CM | POA: Insufficient documentation

## 2011-10-11 LAB — POCT I-STAT, CHEM 8
BUN: 16 mg/dL (ref 6–23)
Creatinine, Ser: 0.8 mg/dL (ref 0.50–1.35)
Sodium: 138 mEq/L (ref 135–145)
TCO2: 27 mmol/L (ref 0–100)

## 2011-10-11 LAB — CBC
HCT: 38.2 % — ABNORMAL LOW (ref 39.0–52.0)
Hemoglobin: 12.8 g/dL — ABNORMAL LOW (ref 13.0–17.0)
MCH: 29.2 pg (ref 26.0–34.0)
MCV: 87 fL (ref 78.0–100.0)
RBC: 4.39 MIL/uL (ref 4.22–5.81)

## 2011-10-11 LAB — POCT I-STAT TROPONIN I

## 2011-10-11 MED ORDER — ALBUTEROL SULFATE (5 MG/ML) 0.5% IN NEBU
5.0000 mg | INHALATION_SOLUTION | Freq: Once | RESPIRATORY_TRACT | Status: DC
  Filled 2011-10-11: qty 1

## 2011-10-11 MED ORDER — IBUPROFEN 800 MG PO TABS: 800.0000 mg | ORAL_TABLET | Freq: Once | ORAL | Status: DC

## 2011-10-11 NOTE — ED Notes (Signed)
Waiting for [facility] to pick up patient

## 2011-10-11 NOTE — Discharge Instructions (Signed)
Ian Ruiz the chest x-ray today did not show pneumonia. EKG did not show your having a heart attack. The labs did not show infection or a heart attack. Albuterol may help your chest and cough he refused the regular so we will not know. I suggest taking ibuprofen for inflammation in your chest. No narcotics received in the ER tonight.

## 2011-10-11 NOTE — ED Notes (Signed)
Patient is upset because he does not want what the MD ordered for pain and now wants to leave AMA.  Patient is requesting d/c paper work and explained to him that he will not have d/c paper work since he will be signing ama.  Patient then changes his mind and wants to stay so he can get his discharge paper work.  Patient also is refusing his breathing treatment.  Explained to him that the medication may make his chest feel better but he is still refusing the treatment.

## 2011-10-11 NOTE — ED Provider Notes (Signed)
History     CSN: [id number]  Arrival date & time 10/11/11  1825   First MD Initiated Contact with Patient 10/11/11 1926      Chief Complaint  Patient presents with  . Cough    (Consider location/radiation/quality/duration/timing/severity/associated sxs/prior treatment) Patient is a 41 y.o. male presenting with cough. The history is provided by the patient. No language interpreter was used.  Cough This is a recurrent problem. The current episode started more than 1 week ago. The problem occurs every few minutes. The problem has been gradually worsening. The cough is non-productive. Associated symptoms include chest pain, chills and sweats. Pertinent negatives include no sore throat, no shortness of breath and no wheezing. He is a smoker. His past medical history is significant for pneumonia.    Past Medical History  Diagnosis Date  . Ulcerative colitis   . Meniere disease   . Bipolar 1 disorder, depressed   . Substance abuse     History reviewed. No pertinent past surgical history.  No family history on file.  History  Substance Use Topics  . Smoking status: Current Everyday Smoker -- 0.5 packs/day for 20 years    Last Attempt to Quit: 08/14/2001  . Smokeless tobacco: Never Used  . Alcohol Use: No     x 2 days ago, last use      Review of Systems  Constitutional: Positive for chills.  HENT: Negative for sore throat.   Respiratory: Positive for cough. Negative for shortness of breath and wheezing.   Cardiovascular: Positive for chest pain.  All other systems reviewed and are negative.    Allergies  Clonidine derivatives and Penicillins  Home Medications   Current Outpatient Rx  Name Route Sig Dispense Refill  . ACETAMINOPHEN 325 MG PO TABS Oral Take 2 tablets (650 mg total) by mouth every 6 (six) hours as needed (or Fever >/= 101).    [name] CITALOPRAM HYDROBROMIDE 10 MG PO TABS Oral Take 3 tablets (30 mg total) by mouth daily. 30 tablet 0  . CLONAZEPAM 1 MG PO  TABS Oral Take 1 tablet (1 mg total) by mouth 3 (three) times daily. 30 tablet 0  . DSS 100 MG CAPS Oral Take 100 mg by mouth 2 (two) times daily. 60 capsule 0    BP 107/90  Pulse 89  Temp(Src) 98.6 F (37 C) (Oral)  Resp 16  Wt 175 lb (79.379 kg)  SpO2 98%  Physical Exam  Nursing note and vitals reviewed. Constitutional: He is oriented to person, place, and time. He appears well-developed and well-nourished.  HENT:  Head: Normocephalic and atraumatic.  Eyes: Pupils are equal, round, and reactive to light.  Neck: Neck supple.  Cardiovascular: Normal rate and regular rhythm.  Exam reveals no gallop and no friction rub.   No murmur heard. Pulmonary/Chest: Breath sounds normal. No respiratory distress.  Abdominal: Soft. He exhibits no distension.  Musculoskeletal: Normal range of motion.  Neurological: He is alert and oriented to person, place, and time. No cranial nerve deficit.  Skin: Skin is warm and dry.  Psychiatric: He has a normal mood and affect.    ED Course  Procedures (including critical care time)  Labs Reviewed  CBC - Abnormal; Notable for the following:    Hemoglobin 12.8 (*)    HCT 38.2 (*)    All other components within normal limits  POCT I-STAT, CHEM 8 - Abnormal; Notable for the following:    Glucose, Bld 121 (*)    All other components  within normal limits  POCT I-STAT TROPONIN I  LAB REPORT - SCANNED   Dg Chest 2 View  10/11/2011  *RADIOLOGY REPORT*  Clinical Data: Chest pain, fever, dry cough  CHEST - 2 VIEW  Comparison: CT chest of 10/03/2011 and chest x-ray of the same date  Findings: The vague opacities noted on prior chest x-ray and better seen on CT of the chest are no longer noted.  These changes most likely reflected an inflammatory process.  There is mild peribronchial thickening present.  No effusion is seen. Mediastinal contours are normal.  The heart is within normal limits in size.  No bony abnormality is seen.  IMPRESSION: No definite active  process.  Mild peribronchial thickening.  Original Report Authenticated By: Juline Patch, M.D.     1. Cough       MDM  Concerned that his pneumonia is back from 2 weeks ago here with a cough. Refused breathing treatment and ibuprofen.   Suspect drug seeker.  C/o chest pain wanting something other than ibu or tylenol.  Threatened to leave when i refused narcotic pain meds.  At discharge he reported that i needed to sign a sheet of paper so his ride from the rehab facility could come and pick him up.  Chest x-ray, ekg and trop  Normal. Labs Reviewed  CBC - Abnormal; Notable for the following:    Hemoglobin 12.8 (*)    HCT 38.2 (*)    All other components within normal limits  POCT I-STAT, CHEM 8 - Abnormal; Notable for the following:    Glucose, Bld 121 (*)    All other components within normal limits  POCT I-STAT TROPONIN I  LAB REPORT - SCANNED          Jethro Bastos, NP 10/13/11 1531

## 2011-10-11 NOTE — ED Notes (Signed)
Patient reports that he was discharged from hospital 2 weeks ago after being treated for pneumonia. Patient now complains of chest burning and recurrent dry cough and chills.
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
## 2011-10-11 NOTE — ED Notes (Signed)
Spoke with Dennie Bible at [facility] and updated on patient care and advised that patient is ready for pick up since he is discharged back to [place]

## 2011-10-14 NOTE — ED Provider Notes (Signed)
Medical screening examination/treatment/procedure(s) were performed by non-physician practitioner and as supervising physician I was immediately available for consultation/collaboration.   Benny Lennert, MD 10/14/11 [phone]

## 2011-10-28 ENCOUNTER — Emergency Department (INDEPENDENT_AMBULATORY_CARE_PROVIDER_SITE_OTHER): Payer: Self-pay

## 2011-10-28 ENCOUNTER — Encounter (HOSPITAL_BASED_OUTPATIENT_CLINIC_OR_DEPARTMENT_OTHER): Payer: Self-pay

## 2011-10-28 ENCOUNTER — Emergency Department (HOSPITAL_BASED_OUTPATIENT_CLINIC_OR_DEPARTMENT_OTHER)
Admission: EM | Admit: 2011-10-28 | Discharge: 2011-10-28 | Disposition: A | Discharge status: Home / Self Care | Payer: Self-pay | Attending: Emergency Medicine | Admitting: Emergency Medicine

## 2011-10-28 DIAGNOSIS — M25529 Pain in unspecified elbow: Secondary | ICD-10-CM | POA: Insufficient documentation

## 2011-10-28 DIAGNOSIS — K029 Dental caries, unspecified: Secondary | ICD-10-CM | POA: Insufficient documentation

## 2011-10-28 DIAGNOSIS — M25521 Pain in right elbow: Secondary | ICD-10-CM

## 2011-10-28 DIAGNOSIS — W19XXXA Unspecified fall, initial encounter: Secondary | ICD-10-CM

## 2011-10-28 DIAGNOSIS — F3130 Bipolar disorder, current episode depressed, mild or moderate severity, unspecified: Secondary | ICD-10-CM | POA: Insufficient documentation

## 2011-10-28 MED ORDER — CLINDAMYCIN HCL 150 MG PO CAPS
450.0000 mg | ORAL_CAPSULE | Freq: Once | ORAL | Status: AC
  Administered 2011-10-28: 450 mg via ORAL
  Filled 2011-10-28: qty 3

## 2011-10-28 MED ORDER — CLINDAMYCIN HCL 150 MG PO CAPS: 450.0000 mg | ORAL_CAPSULE | Freq: Three times a day (TID) | ORAL | Status: AC

## 2011-10-28 MED ORDER — CLINDAMYCIN HCL 150 MG PO CAPS: 300.0000 mg | ORAL_CAPSULE | Freq: Once | ORAL | Status: DC

## 2011-10-28 MED ORDER — CLINDAMYCIN HCL 150 MG PO CAPS: 450.0000 mg | ORAL_CAPSULE | Freq: Three times a day (TID) | ORAL | Status: DC

## 2011-10-28 MED ORDER — IBUPROFEN 800 MG PO TABS: 800.0000 mg | ORAL_TABLET | Freq: Four times a day (QID) | ORAL | Status: DC | PRN

## 2011-10-28 NOTE — ED Notes (Signed)
Pt c/o lower R dental pain.  Pt states he currently is a client at [facility] and can only receive Ibuprofen q8h.  Pt requesting MD order to receive 800mg  q6h.  Pt also c/o R elbow pain for past month.

## 2011-10-28 NOTE — Discharge Instructions (Signed)
Dental Caries  Tooth decay (dental caries, cavities) is the most common of all oral diseases. It occurs in all ages but is more common in children and young adults.  CAUSES  Bacteria in your mouth combine with foods (particularly sugars and starches) to produce plaque. Plaque is a substance that sticks to the hard surfaces of teeth. The bacteria in the plaque produce acids that attack the enamel of teeth. Repeated acid attacks dissolve the enamel and create holes in the teeth. Root surfaces of teeth may also get these holes.  Other contributing factors include:   Frequent snacking and drinking of cavity-producing foods and liquids.   Poor oral hygiene.   Dry mouth.   Substance abuse such as methamphetamine.   Broken or poor fitting dental restorations.   Eating disorders.   Gastroesophageal reflux disease (GERD).   Certain radiation treatments to the head and neck.  SYMPTOMS  At first, dental decay appears as white, chalky areas on the enamel. In this early stage, symptoms are seldom present. As the decay progresses, pits and holes may appear on the enamel surfaces. Progression of the decay will lead to softening of the hard layers of the tooth. At this point you may experience some pain or achy feeling after sweet, hot, or cold foods or drinks are consumed. If left untreated, the decay will reach the internal structures of the tooth and produce severe pain. Extensive dental treatment, such as root canal therapy, may be needed to save the tooth at this late stage of decay development.  DIAGNOSIS  Most cavities will be detected during regular check-ups. A thorough medical and dental history will be taken by the dentist. The dentist will use instruments to check the surfaces of your teeth for any breakdown or discoloration. Some dentists have special instruments, such as lasers, that detect tooth decay. Dental X-rays may also show some cavities that are not visible to the eye (such as between  the contact areas of the teeth). TREATMENT  Treatment involves removal of the tooth decay and replacement with a restorative material such as silver, gold, or composite (white) material. However, if the decay involves a large area of the tooth and there is little remaining healthy tooth structure, a cap (crown) will be fitted over the remaining structure. If the decay involves the center part of the tooth (pulp), root canal treatment will be needed before any type of dental restoration is placed. If the tooth is severely destroyed by the decay process, leaving the remaining tooth structures unrestorable, the tooth will need to be pulled (extracted). Some early tooth decay may be reversed by fluoride treatments and thorough brushing and flossing at home. PREVENTION   Eat healthy foods. Restrict the amount of sugary, starchy foods and liquids you consume. Avoid frequent snacking and drinking of unhealthy foods and liquids.   Sealants can help with prevention of cavities. Sealants are composite resins applied onto the biting surfaces of teeth at risk for decay. They smooth out the pits and grooves and prevent food from being trapped in them. This is done in early childhood before tooth decay has started.   Fluoride tablets may also be prescribed to children between 6 months and 10 years of age if your drinking water is not fluoridated. The fluoride absorbed by the tooth enamel makes teeth less susceptible to decay. Thorough daily cleaning with a toothbrush and dental floss is the best way to prevent cavities. Use of a fluoride toothpaste is highly recommended. Fluoride mouth rinses   may be used in specific cases.   Topical application of fluoride by your dentist is important in children.   Regular visits with a dentist for checkups and cleanings are also important.  SEEK IMMEDIATE DENTAL CARE IF:  You have a fever.   You develop redness and swelling of your face, jaw, or neck.   You develop swelling  around a tooth.   You are unable to open your mouth or cannot swallow.   You have severe pain uncontrolled by pain medicine.  Document Released: 05/07/2002 Document Revised: 04/27/2011 Document Reviewed: 01/20/2011 [facility] Patient Information 2012 [place], [place].Elbow Injury Minor fractures, sprains, and bruises of the elbow will all cause swelling and pain. X-rays often show swelling around the joint but may not show a fracture line on x-rays taken right after the injury. The treatment for all these types of injuries is to reduce swelling and pain and to rest the joint until movement is painless. Repeat exam and x-rays several weeks after an elbow injury may show a minor fracture not seen on the initial exam. Most of the time a sling is needed for the first days or weeks after the injury. Apply ice packs to the elbow for 20-30 minutes every 2 hours for the next few days. Keep your elbow elevated above the level of your heart as much as possible until the pain and swelling are better. An elastic wrap or splint may also be used to reduce movement in addition to a sling. Call your caregiver for follow-up care within one week. Keeping the elbow immobilized for too long can hurt recovery.  SEEK MEDICAL CARE IF:   Your pain increases, or if you develop a numb, cold, or pale forearm or hand.   You are not improving.   You have any other questions or concerns regarding your injury.  Document Released: 09/22/2004 Document Revised: 04/27/2011 Document Reviewed: 09/03/2008 [facility] Patient Information 2012 [place], [place].

## 2011-10-28 NOTE — ED Provider Notes (Signed)
History     CSN: [id number]  Arrival date & time 10/28/11  0908   None     Chief Complaint  Patient presents with  . Dental Pain    (Consider location/radiation/quality/duration/timing/severity/associated sxs/prior treatment) HPI Patient is a 41 year old male currently residing at [facility] for rehabilitation of substance abuse he presents today complaining of of an abscessed right mandibular wisdom tooth as well as right elbow pain. Patient reports fall approximately a month ago on the right elbow while he was still actively using cocaine and other substances that he's never been checked out. He's noted extreme tenderness even on minimal palpation of this or any pressure. Patient does not have any obvious deformity with this. There is no joint effusion. There are no overlying skin changes. Patient does have obvious dental. With partial fracture of his right mandibular wisdom tooth. He is taking ibuprofen every 8 hours a day Loraine Leriche reports that he did not want anything stronger than this but that they require a doctor's order to have this administered more frequently. Patient says the pain in his tooth as an 8/10.  Worse with eating. Pain in the elbow is fine at rest and 9/10 with palpation.  There are no other associated or modifying factors. Past Medical History  Diagnosis Date  . Ulcerative colitis   . Meniere disease   . Bipolar 1 disorder, depressed   . Substance abuse     History reviewed. No pertinent past surgical history.  History reviewed. No pertinent family history.  History  Substance Use Topics  . Smoking status: Former Smoker -- 0.5 packs/day for 20 years    Quit date: 08/14/2001  . Smokeless tobacco: Never Used  . Alcohol Use: No     1 month substance free      Review of Systems  Constitutional: Negative.   HENT: Positive for dental problem.   Eyes: Negative.   Respiratory: Negative.   Cardiovascular: Negative.   Gastrointestinal: Negative.   Genitourinary:  Negative.   Musculoskeletal:       See history of present illness  Neurological: Negative.   Hematological: Negative.   All other systems reviewed and are negative.    Allergies  Clonidine derivatives and Penicillins  Home Medications   Current Outpatient Rx  Name Route Sig Dispense Refill  . IBUPROFEN 800 MG PO TABS Oral Take 800 mg by mouth every 8 (eight) hours as needed.    . ACETAMINOPHEN 325 MG PO TABS Oral Take 2 tablets (650 mg total) by mouth every 6 (six) hours as needed (or Fever >/= 101).    [name] CITALOPRAM HYDROBROMIDE 10 MG PO TABS Oral Take 3 tablets (30 mg total) by mouth daily. 30 tablet 0    BP 123/98  Pulse 86  Temp(Src) 98 F (36.7 C) (Oral)  Resp 16  Ht 5\' 10"  (1.778 m)  Wt 180 lb (81.647 kg)  BMI 25.83 kg/m2  SpO2 98%  Physical Exam  Nursing note and vitals reviewed. GEN: Well-developed, well-nourished male in no distress HEENT: Atraumatic, normocephalic. Poor dentition with obvious dental care he is of right mandibular posterior most molar with partial fracture or. No fluctuance appreciated in the gingiva, buccal space, or sublingual space.  EYES: PERRLA BL, no scleral icterus. NECK: Trachea midline, no meningismus CV: regular rate and rhythm. No murmurs, rubs, or gallops PULM: No respiratory distress.  No crackles, wheezes, or rales. GI: soft, non-tender. No guarding, rebound, or tenderness. + bowel sounds  Neuro: cranial nerves 2-12 intact, no  abnormalities of strength or sensation, A and O x 3 MSK: Patient moves all 4 extremities symmetrically, no deformity, patient complains of point tenderness over the right elbow with no obvious deformity or joint effusion Skin: No rashes.  ED Course  Procedures (including critical care time)  Labs Reviewed - No data to display Dg Elbow Complete Right  10/28/2011  *RADIOLOGY REPORT*  Clinical Data: Fall, elbow pain.  RIGHT ELBOW - COMPLETE 3+ VIEW  Comparison: None  Findings: No acute bony abnormality.   Specifically, no fracture, subluxation, or dislocation.  Soft tissues are intact.  No joint effusion.  IMPRESSION: No acute bony abnormality.  Original Report Authenticated By: Cyndie Chime, M.D.     1. Dental caries   2. Elbow pain, right       MDM  Patient was evaluated by myself. Based on evaluation patient did not require any intervention for spinal abscess. He is allergic to penicillins and clindamycin 450 mg was given here. Patient will be discharged with a ten-day prescription for this and referred to a dentist. Patient was concerned that there could be something wrong with his right elbow given his fall and persistent pain in the past month. Plain film was performed. Patient did not require any medication here is he taking his ibuprofen at 6 AM this morning.  Patient had negative elbow x-ray. He was told that he could use ice for his pain. Patient was discharged with a prescription for clindamycin as well as order for his ibuprofen dosing to be changed to 800 mg every 6 hours with no more than 3 doses of ibuprofen a day. Patient was given referral information for Dr. Oswaldo Done who was the dentist on call. Patient was discharged in good condition.        Cyndra Numbers, MD 10/28/11 1001

## 2011-11-07 ENCOUNTER — Emergency Department (HOSPITAL_BASED_OUTPATIENT_CLINIC_OR_DEPARTMENT_OTHER)
Admission: EM | Admit: 2011-11-07 | Discharge: 2011-11-07 | Disposition: A | Discharge status: Home / Self Care | Payer: Self-pay | Attending: Emergency Medicine | Admitting: Emergency Medicine

## 2011-11-07 ENCOUNTER — Encounter (HOSPITAL_BASED_OUTPATIENT_CLINIC_OR_DEPARTMENT_OTHER): Payer: Self-pay | Admitting: *Deleted

## 2011-11-07 DIAGNOSIS — Z87891 Personal history of nicotine dependence: Secondary | ICD-10-CM | POA: Insufficient documentation

## 2011-11-07 DIAGNOSIS — K0889 Other specified disorders of teeth and supporting structures: Secondary | ICD-10-CM

## 2011-11-07 DIAGNOSIS — K5190 Ulcerative colitis, unspecified, without complications: Secondary | ICD-10-CM | POA: Insufficient documentation

## 2011-11-07 DIAGNOSIS — K029 Dental caries, unspecified: Secondary | ICD-10-CM | POA: Insufficient documentation

## 2011-11-07 NOTE — ED Provider Notes (Signed)
History     CSN: [id number]  Arrival date & time 11/07/11  2841   First MD Initiated Contact with Patient 11/07/11 0831      Chief Complaint  Patient presents with  . Dental Pain    (Consider location/radiation/quality/duration/timing/severity/associated sxs/prior treatment) HPI Comments: Patient comes emergency department with complaint of tooth pain.  He was seen here approximately 10 days ago for same symptoms. He states that he has just finished his previous antibiotic and he is currently taking 800 mg of ibuprofen 3 times a day. He states that he comes back to the emergency department for a referral to a dentist. He was given a referral on his previous visit but he states that he resides at [facility] just recently received approval from them for the followup.  Patient is a 41 y.o. male presenting with tooth pain. The history is provided by the patient. No language interpreter was used.  Dental PainThe primary symptoms include mouth pain. Primary symptoms do not include dental injury, oral bleeding, oral lesions, headaches, fever, shortness of breath, sore throat, angioedema or cough. The symptoms began more than 1 week ago. The symptoms are unchanged. The symptoms are chronic. The symptoms occur constantly.  Affected locations include: gum(s) and teeth.  Additional symptoms include: dental sensitivity to temperature and gum tenderness. Additional symptoms do not include: gum swelling, purulent gums, trismus, jaw pain, facial swelling, trouble swallowing and ear pain. Medical issues include: periodontal disease. Medical issues do not include: smoking.    Past Medical History  Diagnosis Date  . Ulcerative colitis   . Meniere disease   . Substance abuse     History reviewed. No pertinent past surgical history.  History reviewed. No pertinent family history.  History  Substance Use Topics  . Smoking status: Former Smoker -- 0.5 packs/day for 20 years    Quit date: 08/14/2001  .  Smokeless tobacco: Never Used  . Alcohol Use: No     1 month substance free      Review of Systems  Constitutional: Negative for fever.  HENT: Positive for dental problem. Negative for ear pain, sore throat, facial swelling and trouble swallowing.   Respiratory: Negative for cough and shortness of breath.   Neurological: Negative for headaches.    Allergies  Clonidine derivatives and Penicillins  Home Medications   Current Outpatient Rx  Name Route Sig Dispense Refill  . ACETAMINOPHEN 325 MG PO TABS Oral Take 2 tablets (650 mg total) by mouth every 6 (six) hours as needed (or Fever >/= 101).    [name] CITALOPRAM HYDROBROMIDE 10 MG PO TABS Oral Take 3 tablets (30 mg total) by mouth daily. 30 tablet 0  . CLINDAMYCIN HCL 150 MG PO CAPS Oral Take 3 capsules (450 mg total) by mouth 3 (three) times daily. 90 capsule 0  . IBUPROFEN 800 MG PO TABS Oral Take 1 tablet (800 mg total) by mouth every 6 (six) hours as needed for pain (Not to exceed 3 doses per day.). 30 tablet 0    BP 122/90  Pulse 88  Temp(Src) 98.1 F (36.7 C) (Oral)  Resp 20  Ht 5\' 10"  (1.778 m)  Wt 185 lb (83.915 kg)  BMI 26.54 kg/m2  SpO2 99%  Physical Exam  Nursing note and vitals reviewed. Constitutional: He is oriented to person, place, and time. He appears well-developed and well-nourished. No distress.  HENT:  Head: Normocephalic and atraumatic. No trismus in the jaw.  Mouth/Throat: Uvula is midline, oropharynx is clear and moist  and mucous membranes are normal. Dental caries present. No dental abscesses, uvula swelling or lacerations. No oropharyngeal exudate.  Neck: Normal range of motion.  Cardiovascular: Normal rate, regular rhythm, normal heart sounds and intact distal pulses.   No murmur heard. Pulmonary/Chest: Effort normal and breath sounds normal.  Lymphadenopathy:    He has no cervical adenopathy.  Neurological: He is alert and oriented to person, place, and time. He exhibits normal muscle tone.  Coordination normal.  Skin: Skin is warm and dry.    ED Course  Procedures (including critical care time)       MDM    Patient stable no acute distress.  Multiple dental caries. No dental abscess or trismus at this time. Patient has been seen several times previously for dental pain.     Previous ED charts and nursing notes were reviewed by me  Patient / Family / Caregiver understand and agree with initial ED impression and plan with expectations set for ED visit. Pt stable in ED with no significant deterioration in condition. Pt feels improved after observation and/or treatment in ED.     Aylee Littrell L. Trisha Mangle, [place] 11/09/11 2012

## 2011-11-07 NOTE — ED Notes (Signed)
Pt amb to room 2 with quick steady gait in nad. Pt reports 3 weeks of left and right wisdom tooth pain. Seen here 10 days ago, has not followed up, just finished up atbx, cont with pain.

## 2011-11-07 NOTE — Discharge Instructions (Signed)
Toothache Toothaches are usually caused by tooth decay (cavity). However, other causes of toothache include:  Gum disease.   Cracked tooth.   Cracked filling.   Injury.   Jaw problem (temporo mandibular joint or TMJ disorder).   Tooth abscess.   Root sensitivity.   Grinding.   Eruption problems.  Swelling and redness around a painful tooth often means you have a dental abscess. Pain medicine and antibiotics can help reduce symptoms, but you will need to see a dentist within the next few days to have your problem properly evaluated and treated. If tooth decay is the problem, you may need a filling or root canal to save your tooth. If the problem is more severe, your tooth may need to be pulled. SEEK IMMEDIATE MEDICAL CARE IF:  You cannot swallow.   You develop severe swelling, increased redness, or increased pain in your mouth or face.   You have a fever.   You cannot open your mouth adequately.  Document Released: 09/22/2004 Document Revised: 08/04/2011 Document Reviewed: 11/12/2009 [employer] Patient Information 2012 [employer]. 

## 2011-11-11 NOTE — ED Provider Notes (Signed)
Medical screening examination/treatment/procedure(s) were performed by non-physician practitioner and as supervising physician I was immediately available for consultation/collaboration.   Shelda Jakes, MD 11/11/11 2330

## 2011-12-02 ENCOUNTER — Emergency Department (HOSPITAL_BASED_OUTPATIENT_CLINIC_OR_DEPARTMENT_OTHER)
Admission: EM | Admit: 2011-12-02 | Discharge: 2011-12-02 | Disposition: A | Discharge status: Home / Self Care | Payer: Self-pay | Attending: Emergency Medicine | Admitting: Emergency Medicine

## 2011-12-02 ENCOUNTER — Encounter (HOSPITAL_BASED_OUTPATIENT_CLINIC_OR_DEPARTMENT_OTHER): Payer: Self-pay | Admitting: *Deleted

## 2011-12-02 DIAGNOSIS — H1010 Acute atopic conjunctivitis, unspecified eye: Secondary | ICD-10-CM

## 2011-12-02 DIAGNOSIS — Z880 Allergy status to penicillin: Secondary | ICD-10-CM | POA: Insufficient documentation

## 2011-12-02 DIAGNOSIS — Z87891 Personal history of nicotine dependence: Secondary | ICD-10-CM | POA: Insufficient documentation

## 2011-12-02 DIAGNOSIS — H1045 Other chronic allergic conjunctivitis: Secondary | ICD-10-CM | POA: Insufficient documentation

## 2011-12-02 MED ORDER — POLYMYXIN B-TRIMETHOPRIM 10000-0.1 UNIT/ML-% OP SOLN
2.0000 [drp] | OPHTHALMIC | Status: DC
  Filled 2011-12-02: qty 10

## 2011-12-02 MED ORDER — LORATADINE 10 MG PO TABS
10.0000 mg | ORAL_TABLET | Freq: Once | ORAL | Status: DC
  Filled 2011-12-02: qty 1

## 2011-12-02 MED ORDER — LORATADINE 10 MG PO TABS: 10.0000 mg | ORAL_TABLET | Freq: Every day | ORAL | Status: DC

## 2011-12-02 NOTE — ED Provider Notes (Signed)
History     CSN: [id number]  Arrival date & time 12/02/11  0901   First MD Initiated Contact with Patient 12/02/11 [phone]      Chief Complaint  Patient presents with  . Burning Eyes    (Consider location/radiation/quality/duration/timing/severity/associated sxs/prior treatment) Patient is a 41 y.o. male presenting with conjunctivitis. The history is provided by the patient. No language interpreter was used.  Conjunctivitis  The current episode started 3 to 5 days ago. The onset was gradual. The problem occurs continuously. The problem has been gradually worsening. The problem is mild. The symptoms are relieved by nothing. The symptoms are aggravated by nothing. Associated symptoms include eye itching, eye discharge, eye pain and eye redness. Pertinent negatives include no fever, no decreased vision, no double vision, no photophobia, no abdominal pain, no nausea, no vomiting, no congestion, no headaches, no rhinorrhea, no sore throat, no neck pain, no cough and no URI. The eye pain is mild. There is pain in both eyes. The eye pain is not associated with movement. The eyelid exhibits no abnormality.    Past Medical History  Diagnosis Date  . Ulcerative colitis   . Meniere disease   . Substance abuse     Past Surgical History  Procedure Date  . Fracture surgery     No family history on file.  History  Substance Use Topics  . Smoking status: Former Smoker -- 0.5 packs/day for 20 years    Quit date: 08/14/2001  . Smokeless tobacco: Never Used  . Alcohol Use: No     1 month substance free      Review of Systems  Constitutional: Negative for fever, activity change, appetite change and fatigue.  HENT: Negative for congestion, sore throat, facial swelling, rhinorrhea and neck pain.   Eyes: Positive for pain, discharge, redness and itching. Negative for double vision, photophobia and visual disturbance.  Respiratory: Negative for cough and shortness of breath.   Cardiovascular:  Negative for chest pain and palpitations.  Gastrointestinal: Negative for nausea, vomiting and abdominal pain.  Genitourinary: Negative for dysuria, urgency, frequency and flank pain.  Musculoskeletal: Negative for myalgias, back pain and arthralgias.  Neurological: Negative for dizziness, weakness, light-headedness, numbness and headaches.  All other systems reviewed and are negative.    Allergies  Clonidine derivatives and Penicillins  Home Medications   Current Outpatient Rx  Name Route Sig Dispense Refill  . ACETAMINOPHEN 325 MG PO TABS Oral Take 2 tablets (650 mg total) by mouth every 6 (six) hours as needed (or Fever >/= 101).    [name] CITALOPRAM HYDROBROMIDE 10 MG PO TABS Oral Take 3 tablets (30 mg total) by mouth daily. 30 tablet 0  . IBUPROFEN 800 MG PO TABS Oral Take 1 tablet (800 mg total) by mouth every 6 (six) hours as needed for pain (Not to exceed 3 doses per day.). 30 tablet 0  . LORATADINE 10 MG PO TABS Oral Take 1 tablet (10 mg total) by mouth daily. 30 tablet 0    BP 122/86  Pulse 92  Temp(Src) 98.8 F (37.1 C) (Oral)  Resp 20  Ht 5\' 10"  (1.778 m)  Wt 185 lb (83.915 kg)  BMI 26.54 kg/m2  SpO2 100%  Physical Exam  Nursing note and vitals reviewed. Constitutional: He is oriented to person, place, and time. He appears well-developed and well-nourished. No distress.  HENT:  Head: Normocephalic and atraumatic.  Mouth/Throat: Oropharynx is clear and moist.  Eyes: Conjunctivae and EOM are normal. Pupils are equal, round,  and reactive to light.       Slight reddening of the sclera. There is no evidence of chemosis. No discharge.  Neck: Normal range of motion. Neck supple.  Cardiovascular: Normal rate, regular rhythm, normal heart sounds and intact distal pulses.  Exam reveals no gallop and no friction rub.   No murmur heard. Pulmonary/Chest: Effort normal and breath sounds normal. No respiratory distress.  Abdominal: Soft. Bowel sounds are normal. There is no  tenderness.  Musculoskeletal: Normal range of motion. He exhibits no tenderness.  Neurological: He is alert and oriented to person, place, and time. No cranial nerve deficit.  Skin: Skin is warm and dry. No rash noted.    ED Course  Procedures (including critical care time)  Labs Reviewed - No data to display No results found.   1. Allergic conjunctivitis       MDM  Allergic conjunctivitis. He is provided a prescription for Polytrim as well as he wanted some sort of drops for his eyes. He had no preceding URI symptoms. I feel this is consistent with an allergic conjunctivitis. Placed on Claritin. Instructed to followup with his primary care physician        Dayton Bailiff, MD 12/02/11 [phone]

## 2011-12-02 NOTE — Discharge Instructions (Signed)

## 2011-12-02 NOTE — ED Notes (Signed)
Patient states he has had bilateral eye burning and draining for the last 3 days.  States he had dried exudate around his eyes in the morning.

## 2012-02-19 ENCOUNTER — Emergency Department (HOSPITAL_BASED_OUTPATIENT_CLINIC_OR_DEPARTMENT_OTHER)
Admission: EM | Admit: 2012-02-19 | Discharge: 2012-02-19 | Disposition: A | Discharge status: Home / Self Care | Payer: Self-pay | Attending: Emergency Medicine | Admitting: Emergency Medicine

## 2012-02-19 ENCOUNTER — Encounter (HOSPITAL_BASED_OUTPATIENT_CLINIC_OR_DEPARTMENT_OTHER): Payer: Self-pay | Admitting: *Deleted

## 2012-02-19 DIAGNOSIS — Z87891 Personal history of nicotine dependence: Secondary | ICD-10-CM | POA: Insufficient documentation

## 2012-02-19 DIAGNOSIS — H8109 Meniere's disease, unspecified ear: Secondary | ICD-10-CM | POA: Insufficient documentation

## 2012-02-19 DIAGNOSIS — K0889 Other specified disorders of teeth and supporting structures: Secondary | ICD-10-CM

## 2012-02-19 DIAGNOSIS — K0510 Chronic gingivitis, plaque induced: Secondary | ICD-10-CM | POA: Insufficient documentation

## 2012-02-19 DIAGNOSIS — K029 Dental caries, unspecified: Secondary | ICD-10-CM | POA: Insufficient documentation

## 2012-02-19 MED ORDER — CLINDAMYCIN HCL 150 MG PO CAPS: 150.0000 mg | ORAL_CAPSULE | Freq: Four times a day (QID) | ORAL | Status: AC

## 2012-02-19 MED ORDER — IBUPROFEN 800 MG PO TABS: 800.0000 mg | ORAL_TABLET | Freq: Three times a day (TID) | ORAL | Status: AC

## 2012-02-19 NOTE — ED Notes (Signed)
Pt states he has had dental pain x 3-4 days and feels like one of his teeth may be abscessed, causing the others to hurt

## 2012-02-19 NOTE — ED Provider Notes (Signed)
Medical screening examination/treatment/procedure(s) were performed by non-physician practitioner and as supervising physician I was immediately available for consultation/collaboration.   Lakira Ogando, MD 02/19/12 2302 

## 2012-02-19 NOTE — Discharge Instructions (Signed)
Dental Pain  A tooth ache may be caused by cavities (tooth decay). Cavities expose the nerve of the tooth to air and hot or cold temperatures. It may come from an infection or abscess (also called a boil or furuncle) around your tooth. It is also often caused by dental caries (tooth decay). This causes the pain you are having.  DIAGNOSIS   Your caregiver can diagnose this problem by exam.  TREATMENT   · If caused by an infection, it may be treated with medications which kill germs (antibiotics) and pain medications as prescribed by your caregiver. Take medications as directed.  · Only take over-the-counter or prescription medicines for pain, discomfort, or fever as directed by your caregiver.  · Whether the tooth ache today is caused by infection or dental disease, you should see your dentist as soon as possible for further care.  SEEK MEDICAL CARE IF:  The exam and treatment you received today has been provided on an emergency basis only. This is not a substitute for complete medical or dental care. If your problem worsens or new problems (symptoms) appear, and you are unable to meet with your dentist, call or return to this location.  SEEK IMMEDIATE MEDICAL CARE IF:   · You have a fever.  · You develop redness and swelling of your face, jaw, or neck.  · You are unable to open your mouth.  · You have severe pain uncontrolled by pain medicine.  MAKE SURE YOU:   · Understand these instructions.  · Will watch your condition.  · Will get help right away if you are not doing well or get worse.  Document Released: 08/15/2005 Document Revised: 08/04/2011 Document Reviewed: 04/02/2008  [employer]® Patient Information ©2012 [employer].

## 2012-02-19 NOTE — ED Provider Notes (Signed)
History     CSN: [id number]  Arrival date & time 02/19/12  1528   First MD Initiated Contact with Patient 02/19/12 1614      Chief Complaint  Patient presents with  . Dental Pain    (Consider location/radiation/quality/duration/timing/severity/associated sxs/prior treatment) Patient is a 41 y.o. male presenting with tooth pain. The history is provided by the patient. No language interpreter was used.  Dental PainThe primary symptoms include mouth pain. The symptoms began 3 to 5 days ago. The symptoms are unchanged. The symptoms are new.  Pt complains of pain in front teeth.     Past Medical History  Diagnosis Date  . Ulcerative colitis   . Meniere disease   . Substance abuse     Past Surgical History  Procedure Date  . Fracture surgery     History reviewed. No pertinent family history.  History  Substance Use Topics  . Smoking status: Former Smoker -- 0.5 packs/day for 20 years    Quit date: 08/14/2001  . Smokeless tobacco: Never Used  . Alcohol Use: No     1 month substance free      Review of Systems  HENT: Positive for dental problem.   All other systems reviewed and are negative.    Allergies  Clonidine derivatives and Penicillins  Home Medications   Current Outpatient Rx  Name Route Sig Dispense Refill  . IBUPROFEN 800 MG PO TABS Oral Take 800 mg by mouth every 6 (six) hours as needed. Patient used this medication for his pain.      BP 105/76  Pulse 93  Temp 98.1 F (36.7 C) (Oral)  Resp 20  Ht 5\' 10"  (1.778 m)  Wt 180 lb (81.647 kg)  BMI 25.83 kg/m2  SpO2 97%  Physical Exam  Nursing note and vitals reviewed. Constitutional: He is oriented to person, place, and time. He appears well-developed and well-nourished.  HENT:  Head: Normocephalic and atraumatic.  Right Ear: External ear normal.  Left Ear: External ear normal.  Nose: Nose normal.  Mouth/Throat: Oropharynx is clear and moist.       Multiple cavities, broken teeth, gum disease   Eyes: Pupils are equal, round, and reactive to light.  Neck: Normal range of motion.  Pulmonary/Chest: Effort normal and breath sounds normal.  Abdominal: Soft.  Musculoskeletal: Normal range of motion.  Neurological: He is alert and oriented to person, place, and time. He has normal reflexes.  Skin: Skin is warm.  Psychiatric: He has a normal mood and affect.    ED Course  Procedures (including critical care time)  Labs Reviewed - No data to display No results found.   No diagnosis found.    MDM  Pt referred to dentist on call.  Rx for clindamycin        Lonia Skinner [place], [place] 02/19/12 1635

## 2012-06-05 ENCOUNTER — Emergency Department (HOSPITAL_COMMUNITY)
Admission: EM | Admit: 2012-06-05 | Discharge: 2012-06-06 | Disposition: A | Discharge status: Home / Self Care | Payer: Self-pay | Attending: Emergency Medicine | Admitting: Emergency Medicine

## 2012-06-05 ENCOUNTER — Emergency Department (HOSPITAL_COMMUNITY): Payer: Self-pay

## 2012-06-05 ENCOUNTER — Encounter (HOSPITAL_COMMUNITY): Payer: Self-pay | Admitting: Adult Health

## 2012-06-05 DIAGNOSIS — R079 Chest pain, unspecified: Secondary | ICD-10-CM | POA: Insufficient documentation

## 2012-06-05 DIAGNOSIS — E876 Hypokalemia: Secondary | ICD-10-CM | POA: Insufficient documentation

## 2012-06-05 DIAGNOSIS — R0602 Shortness of breath: Secondary | ICD-10-CM | POA: Insufficient documentation

## 2012-06-05 LAB — BASIC METABOLIC PANEL
BUN: 9 mg/dL (ref 6–23)
Chloride: 102 mEq/L (ref 96–112)
Creatinine, Ser: 1 mg/dL (ref 0.50–1.35)
GFR calc Af Amer: >90 mL/min (ref >90)
Glucose, Bld: 119 mg/dL — ABNORMAL HIGH (ref 70–99)

## 2012-06-05 LAB — CBC
HCT: 41.6 % (ref 39.0–52.0)
Hemoglobin: 14.7 g/dL (ref 13.0–17.0)
MCV: 87.8 fL (ref 78.0–100.0)
RDW: 13.8 % (ref 11.5–15.5)
WBC: 15.3 10*3/uL — ABNORMAL HIGH (ref 4.0–10.5)

## 2012-06-05 MED ORDER — SODIUM CHLORIDE 0.9 % IV BOLUS (SEPSIS)
1000.0000 mL | Freq: Once | INTRAVENOUS | Status: AC
  Administered 2012-06-05: 1000 mL via INTRAVENOUS

## 2012-06-05 MED ORDER — NITROGLYCERIN 0.4 MG SL SUBL
0.4000 mg | SUBLINGUAL_TABLET | SUBLINGUAL | Status: DC | PRN
  Administered 2012-06-05 (×2): 0.4 mg via SUBLINGUAL
  Filled 2012-06-05: qty 25

## 2012-06-05 MED ORDER — MORPHINE SULFATE 4 MG/ML IJ SOLN
4.0000 mg | Freq: Once | INTRAMUSCULAR | Status: AC
  Administered 2012-06-06: 4 mg via INTRAVENOUS
  Filled 2012-06-05: qty 1

## 2012-06-05 MED ORDER — ASPIRIN 81 MG PO CHEW
324.0000 mg | CHEWABLE_TABLET | Freq: Once | ORAL | Status: AC
  Administered 2012-06-05: 324 mg via ORAL
  Filled 2012-06-05: qty 4

## 2012-06-05 NOTE — ED Notes (Signed)
Pt c/o SOB and chest tightness tonight. Sweating in bed.  O2 sats 100%

## 2012-06-05 NOTE — ED Notes (Signed)
Chest tightness that began one hour ago associated with SOB and pain with inspiration. Pain is located in center of chest. Bilateral breath sounds clear. Denies nausea. Vomiting. C/o dizziness.

## 2012-06-05 NOTE — ED Provider Notes (Signed)
History     CSN: [id number]  Arrival date & time 06/05/12  1953   First MD Initiated Contact with Patient 06/05/12 2209      Chief Complaint  Patient presents with  . Shortness of Breath    HPI   41 year old male past medical history of drug abuse including cocaine,  smoker. Who presents with sudden onset central chest pain and SOB. Described as sharp and worsened with movement and deep inspiration.  It does not radiate to his neck or arms. He denies in any exertional component. Denies any prior similar episodes. No nausea or vomiting. Patient further denies any palpitations.  Past Medical History  Diagnosis Date  . Ulcerative colitis   . Meniere disease   . Substance abuse     Past Surgical History  Procedure Date  . Fracture surgery     History reviewed. No pertinent family history.  History  Substance Use Topics  . Smoking status: Former Smoker -- 0.5 packs/day for 20 years    Quit date: 08/14/2001  . Smokeless tobacco: Never Used  . Alcohol Use: No     1 month substance free      Review of Systems  Constitutional: Negative for fever, activity change and appetite change.  HENT: Negative for ear pain, congestion, sore throat, rhinorrhea, neck pain, neck stiffness and sinus pressure.   Eyes: Negative for pain and redness.  Respiratory: Negative for cough, chest tightness and shortness of breath.   Cardiovascular: Positive for chest pain. Negative for palpitations.  Gastrointestinal: Negative for nausea, vomiting, abdominal pain, diarrhea and abdominal distention.  Genitourinary: Negative for dysuria, flank pain and difficulty urinating.  Musculoskeletal: Negative for back pain.  Skin: Negative for rash and wound.  Neurological: Negative for dizziness, light-headedness, numbness and headaches.  Hematological: Negative for adenopathy.  Psychiatric/Behavioral: Negative for behavioral problems, confusion and agitation.    Allergies  Clonidine derivatives and  Penicillins  Home Medications  No current outpatient prescriptions on file.  BP 139/85  Pulse 85  Temp 98.5 F (36.9 C)  Resp 11  SpO2 100%  Physical Exam  Constitutional: He is oriented to person, place, and time. He appears well-developed and well-nourished. No distress.  HENT:  Head: Normocephalic and atraumatic.  Nose: Nose normal.  Mouth/Throat: Oropharynx is clear and moist.  Eyes: Conjunctivae normal and EOM are normal. Pupils are equal, round, and reactive to light.  Neck: Normal range of motion. Neck supple. No tracheal deviation present.  Cardiovascular: Normal rate, regular rhythm, normal heart sounds and intact distal pulses.        bilaterally symmetric upper extremity pulses . Bilaterally symmetric lower extremity pulses.  Pulmonary/Chest: Effort normal and breath sounds normal. No respiratory distress. He has no wheezes. He has no rales. He exhibits tenderness ( Parasternal Tender to palpaatioon).  Abdominal: Soft. Bowel sounds are normal. He exhibits no distension and no mass ( No palpable pulsatile mass). There is no tenderness. There is no rebound and no guarding.  Musculoskeletal: Normal range of motion. He exhibits no edema and no tenderness.  Neurological: He is alert and oriented to person, place, and time.  Skin: Skin is warm and dry.  Psychiatric: He has a normal mood and affect. His behavior is normal.    ED Course  Procedures (including critical care time)    Results for orders placed during the hospital encounter of 06/05/12  CBC      Component Value Range   WBC 15.3 (*) 4.0 - 10.5 K/uL  RBC 4.74  4.22 - 5.81 MIL/uL   Hemoglobin 14.7  13.0 - 17.0 g/dL   HCT 40.9  81.1 - 91.4 %   MCV 87.8  78.0 - 100.0 fL   MCH 31.0  26.0 - 34.0 pg   MCHC 35.3  30.0 - 36.0 g/dL   RDW 78.2  95.6 - 21.3 %   Platelets 261  150 - 400 K/uL  BASIC METABOLIC PANEL      Component Value Range   Sodium 139  135 - 145 mEq/L   Potassium 3.1 (*) 3.5 - 5.1 mEq/L    Chloride 102  96 - 112 mEq/L   CO2 22  19 - 32 mEq/L   Glucose, Bld 119 (*) 70 - 99 mg/dL   BUN 9  6 - 23 mg/dL   Creatinine, Ser 0.86  0.50 - 1.35 mg/dL   Calcium 9.8  8.4 - 57.8 mg/dL   GFR calc non Af Amer >90  >90 mL/min   GFR calc Af Amer >90  >90 mL/min  POCT I-STAT TROPONIN I      Component Value Range   Troponin i, poc 0.00  0.00 - 0.08 ng/mL   Comment 3                DG Chest 2 View (Final result)   Result time:06/05/12 2128    Final result by Rad Results In Interface (06/05/12 21:28:17)    Narrative:   *RADIOLOGY REPORT*  Clinical Data: Shortness of breath.  CHEST - 2 VIEW  Comparison: 10/11/2011.  Findings: The heart, mediastinum and hilar contours are normal. The lungs are fully expanded and clear. No effusions or pneumothoraces. No acute bony changes.  IMPRESSION: No active disease.   Original Report Authenticated By: Mervin Hack, M.D.      1. Chest pain   2. Hypokalemia       MDM     41 year old male in no acute distress, afebrile, vital signs stable, nontoxic appearing. Presents with couple hours history of central chest pain reproducible on palpation and movement. History of tearing pain radiating to his back his CT angiogram of the chest ordered. Bilateral symmetric upper extremity pulses. Abdominal exam unremarkable with no epigastric tenderness to palpation. No guarding or rebound.  No palpable pulsatile mass, bilaterally symmetric femoral pulses.  EKG with normal sinus rhythm unchanged from prior no segment abnormalities. Initial troponin negative. Patient denies cocaine use for years. He is from January 2013 demonstrates positive for cocaine. When approached with this information the patient changed his story. The patient currently denies cocaine use.  The patient is not tachycardic is not tachypneic, is not hypoxic. No tenderness to palpation in bilaterally. No recent surgeries or traumas. No cancer. Low suspicion for pulmonary embolism.     Case discussed with Dr. Lorre Nick at 1:25 AM care transferred. At time of sign out awaiting second troponin and CTA of chest.  Will reasses at that time.            Nadara Mustard, MD 06/06/12 2129

## 2012-06-06 ENCOUNTER — Encounter (HOSPITAL_COMMUNITY): Payer: Self-pay | Admitting: Radiology

## 2012-06-06 ENCOUNTER — Emergency Department (HOSPITAL_COMMUNITY): Payer: Self-pay

## 2012-06-06 LAB — POCT I-STAT TROPONIN I

## 2012-06-06 MED ORDER — POTASSIUM CHLORIDE CRYS ER 20 MEQ PO TBCR
40.0000 meq | EXTENDED_RELEASE_TABLET | Freq: Once | ORAL | Status: AC
  Administered 2012-06-06: 40 meq via ORAL
  Filled 2012-06-06: qty 2

## 2012-06-06 MED ORDER — KETOROLAC TROMETHAMINE 30 MG/ML IJ SOLN
30.0000 mg | Freq: Once | INTRAMUSCULAR | Status: AC
  Administered 2012-06-06: 30 mg via INTRAVENOUS
  Filled 2012-06-06: qty 1

## 2012-06-06 MED ORDER — HYDROMORPHONE HCL PF 1 MG/ML IJ SOLN
1.0000 mg | Freq: Once | INTRAMUSCULAR | Status: AC
  Administered 2012-06-06: 1 mg via INTRAVENOUS
  Filled 2012-06-06: qty 1

## 2012-06-06 MED ORDER — POTASSIUM CHLORIDE CRYS ER 20 MEQ PO TBCR: 40.0000 meq | EXTENDED_RELEASE_TABLET | Freq: Once | ORAL | Status: DC

## 2012-06-06 MED ORDER — METHOCARBAMOL 750 MG PO TABS: 750.0000 mg | ORAL_TABLET | Freq: Four times a day (QID) | ORAL | Status: DC

## 2012-06-06 MED ORDER — IOHEXOL 350 MG/ML SOLN
100.0000 mL | Freq: Once | INTRAVENOUS | Status: AC | PRN
  Administered 2012-06-06: 100 mL via INTRAVENOUS

## 2012-06-06 MED ORDER — LORAZEPAM 2 MG/ML IJ SOLN
1.0000 mg | Freq: Once | INTRAMUSCULAR | Status: AC
  Administered 2012-06-06: 2 mg via INTRAVENOUS
  Filled 2012-06-06: qty 1

## 2012-06-06 MED ORDER — IBUPROFEN 800 MG PO TABS: 800.0000 mg | ORAL_TABLET | Freq: Three times a day (TID) | ORAL | Status: DC

## 2012-06-06 NOTE — ED Provider Notes (Signed)
Patient signed out to me and work appears negative for pulmonary embolism. Will be given pain medication and discharged home  Toy Baker, MD 06/06/12 [phone]

## 2012-06-06 NOTE — ED Provider Notes (Signed)
Plains of anterior chest pain onset this afternoon anterior nonradiating worse with moving changing positions. On exam patient has exquisite pain is just when he sits up in bed from a supine position lungs clear auscultation heart regular rate and rhythm no murmurs or rubs. Strongly doubt acute coronary syndrome based on exam, EKG and troponin pain likely musculoskeletal in etiology  Doug Sou, MD 06/06/12 1610

## 2012-06-08 NOTE — ED Provider Notes (Signed)
I have personally seen and examined the patient.  I have discussed the plan of care with the resident.  I have reviewed the documentation on PMH/FH/Soc. History.  I have reviewed the documentation of the resident and agree.  Doug Sou, MD 06/08/12 1207

## 2012-08-12 ENCOUNTER — Emergency Department (HOSPITAL_COMMUNITY): Payer: Self-pay

## 2012-08-12 ENCOUNTER — Encounter (HOSPITAL_COMMUNITY): Payer: Self-pay | Admitting: Emergency Medicine

## 2012-08-12 ENCOUNTER — Inpatient Hospital Stay (HOSPITAL_COMMUNITY)
Admission: EM | Admit: 2012-08-12 | Discharge: 2012-08-15 | DRG: 440 | Disposition: A | Discharge status: Home / Self Care | Payer: MEDICAID | Attending: Internal Medicine | Admitting: Internal Medicine

## 2012-08-12 DIAGNOSIS — Z8719 Personal history of other diseases of the digestive system: Secondary | ICD-10-CM

## 2012-08-12 DIAGNOSIS — D72829 Elevated white blood cell count, unspecified: Secondary | ICD-10-CM | POA: Diagnosis present

## 2012-08-12 DIAGNOSIS — R1031 Right lower quadrant pain: Secondary | ICD-10-CM

## 2012-08-12 DIAGNOSIS — F1910 Other psychoactive substance abuse, uncomplicated: Secondary | ICD-10-CM

## 2012-08-12 DIAGNOSIS — F319 Bipolar disorder, unspecified: Secondary | ICD-10-CM

## 2012-08-12 DIAGNOSIS — N179 Acute kidney failure, unspecified: Secondary | ICD-10-CM

## 2012-08-12 DIAGNOSIS — F1210 Cannabis abuse, uncomplicated: Secondary | ICD-10-CM | POA: Diagnosis present

## 2012-08-12 DIAGNOSIS — Z23 Encounter for immunization: Secondary | ICD-10-CM

## 2012-08-12 DIAGNOSIS — K859 Acute pancreatitis without necrosis or infection, unspecified: Principal | ICD-10-CM

## 2012-08-12 DIAGNOSIS — Z87891 Personal history of nicotine dependence: Secondary | ICD-10-CM

## 2012-08-12 DIAGNOSIS — F1010 Alcohol abuse, uncomplicated: Secondary | ICD-10-CM | POA: Diagnosis present

## 2012-08-12 DIAGNOSIS — Z888 Allergy status to other drugs, medicaments and biological substances status: Secondary | ICD-10-CM

## 2012-08-12 DIAGNOSIS — F341 Dysthymic disorder: Secondary | ICD-10-CM

## 2012-08-12 DIAGNOSIS — R45851 Suicidal ideations: Secondary | ICD-10-CM

## 2012-08-12 DIAGNOSIS — Z8249 Family history of ischemic heart disease and other diseases of the circulatory system: Secondary | ICD-10-CM

## 2012-08-12 DIAGNOSIS — F1410 Cocaine abuse, uncomplicated: Secondary | ICD-10-CM | POA: Diagnosis present

## 2012-08-12 DIAGNOSIS — H8109 Meniere's disease, unspecified ear: Secondary | ICD-10-CM

## 2012-08-12 DIAGNOSIS — Z880 Allergy status to penicillin: Secondary | ICD-10-CM

## 2012-08-12 DIAGNOSIS — N390 Urinary tract infection, site not specified: Secondary | ICD-10-CM

## 2012-08-12 LAB — HEPATIC FUNCTION PANEL
ALT: 11 U/L (ref 0–53)
AST: 14 U/L (ref 0–37)
Albumin: 3.1 g/dL — ABNORMAL LOW (ref 3.5–5.2)
Total Bilirubin: 0.2 mg/dL — ABNORMAL LOW (ref 0.3–1.2)

## 2012-08-12 LAB — COMPREHENSIVE METABOLIC PANEL
ALT: 11 U/L (ref 0–53)
AST: 11 U/L (ref 0–37)
Albumin: 3 g/dL — ABNORMAL LOW (ref 3.5–5.2)
Alkaline Phosphatase: 47 U/L (ref 39–117)
Potassium: 3.3 mEq/L — ABNORMAL LOW (ref 3.5–5.1)
Sodium: 138 mEq/L (ref 135–145)
Total Protein: 6.6 g/dL (ref 6.0–8.3)

## 2012-08-12 LAB — URINALYSIS, ROUTINE W REFLEX MICROSCOPIC
Bilirubin Urine: NEGATIVE
Glucose, UA: NEGATIVE mg/dL
Ketones, ur: NEGATIVE mg/dL
Nitrite: NEGATIVE
Protein, ur: NEGATIVE mg/dL
pH: 6 (ref 5.0–8.0)

## 2012-08-12 LAB — DIFFERENTIAL
Eosinophils Absolute: 0.2 10*3/uL (ref 0.0–0.7)
Eosinophils Relative: 2 % (ref 0–5)
Lymphs Abs: 1.9 10*3/uL (ref 0.7–4.0)
Monocytes Relative: 7 % (ref 3–12)

## 2012-08-12 LAB — CBC
MCHC: 33.7 g/dL (ref 30.0–36.0)
RDW: 13.4 % (ref 11.5–15.5)

## 2012-08-12 LAB — RAPID URINE DRUG SCREEN, HOSP PERFORMED
Amphetamines: NOT DETECTED
Barbiturates: NOT DETECTED
Benzodiazepines: NOT DETECTED

## 2012-08-12 LAB — ETHANOL: Alcohol, Ethyl (B): <11 mg/dL (ref 0–11)

## 2012-08-12 MED ORDER — HYDROMORPHONE HCL PF 1 MG/ML IJ SOLN
0.5000 mg | INTRAMUSCULAR | Status: DC | PRN
  Administered 2012-08-12 – 2012-08-13 (×3): 1 mg via INTRAVENOUS
  Filled 2012-08-12 (×3): qty 1

## 2012-08-12 MED ORDER — MORPHINE SULFATE 4 MG/ML IJ SOLN
4.0000 mg | INTRAMUSCULAR | Status: DC | PRN
  Administered 2012-08-12: 4 mg via INTRAVENOUS
  Filled 2012-08-12: qty 1

## 2012-08-12 MED ORDER — ONDANSETRON HCL 4 MG/2ML IJ SOLN
4.0000 mg | Freq: Four times a day (QID) | INTRAMUSCULAR | Status: DC | PRN
  Administered 2012-08-12 – 2012-08-13 (×2): 4 mg via INTRAVENOUS
  Filled 2012-08-12 (×2): qty 2

## 2012-08-12 MED ORDER — INFLUENZA VIRUS VACC SPLIT PF IM SUSP
0.5000 mL | INTRAMUSCULAR | Status: AC
  Administered 2012-08-13: 0.5 mL via INTRAMUSCULAR
  Filled 2012-08-12: qty 0.5

## 2012-08-12 MED ORDER — HYDROMORPHONE HCL PF 1 MG/ML IJ SOLN
1.0000 mg | Freq: Once | INTRAMUSCULAR | Status: AC
  Administered 2012-08-12: 1 mg via INTRAVENOUS

## 2012-08-12 MED ORDER — SODIUM CHLORIDE 0.9 % IV SOLN
1000.0000 mL | INTRAVENOUS | Status: DC
  Administered 2012-08-12 – 2012-08-15 (×8): 1000 mL via INTRAVENOUS

## 2012-08-12 MED ORDER — ONDANSETRON HCL 4 MG PO TABS: 4.0000 mg | ORAL_TABLET | Freq: Four times a day (QID) | ORAL | Status: DC | PRN

## 2012-08-12 MED ORDER — SODIUM CHLORIDE 0.9 % IV SOLN: INTRAVENOUS | Status: AC

## 2012-08-12 MED ORDER — PANTOPRAZOLE SODIUM 40 MG IV SOLR
40.0000 mg | Freq: Every day | INTRAVENOUS | Status: DC
  Administered 2012-08-12 – 2012-08-14 (×3): 40 mg via INTRAVENOUS
  Filled 2012-08-12 (×4): qty 40

## 2012-08-12 MED ORDER — HYDROMORPHONE HCL PF 2 MG/ML IJ SOLN
INTRAMUSCULAR | Status: AC
  Filled 2012-08-12: qty 1

## 2012-08-12 MED ORDER — HEPARIN SODIUM (PORCINE) 5000 UNIT/ML IJ SOLN
5000.0000 [IU] | Freq: Three times a day (TID) | INTRAMUSCULAR | Status: DC
  Administered 2012-08-12 – 2012-08-14 (×6): 5000 [IU] via SUBCUTANEOUS
  Filled 2012-08-12 (×11): qty 1

## 2012-08-12 MED ORDER — MORPHINE SULFATE 4 MG/ML IJ SOLN: 4.0000 mg | INTRAMUSCULAR | Status: DC | PRN

## 2012-08-12 MED ORDER — ONDANSETRON HCL 4 MG/2ML IJ SOLN
4.0000 mg | Freq: Once | INTRAMUSCULAR | Status: AC
  Administered 2012-08-12: 4 mg via INTRAVENOUS
  Filled 2012-08-12: qty 2

## 2012-08-12 MED ORDER — SODIUM CHLORIDE 0.9 % IV SOLN
1000.0000 mL | Freq: Once | INTRAVENOUS | Status: AC
  Administered 2012-08-12: 1000 mL via INTRAVENOUS

## 2012-08-12 MED ORDER — HYDROMORPHONE HCL PF 1 MG/ML IJ SOLN
1.0000 mg | Freq: Once | INTRAMUSCULAR | Status: AC
  Administered 2012-08-12: 1 mg via INTRAVENOUS
  Filled 2012-08-12: qty 1

## 2012-08-12 MED ORDER — PANTOPRAZOLE SODIUM 40 MG PO TBEC: 40.0000 mg | DELAYED_RELEASE_TABLET | Freq: Every day | ORAL | Status: DC

## 2012-08-12 MED ORDER — POLYETHYLENE GLYCOL 3350 17 G PO PACK
17.0000 g | PACK | Freq: Every day | ORAL | Status: DC | PRN
  Filled 2012-08-12: qty 1

## 2012-08-12 NOTE — ED Notes (Signed)
C/o abdominal pain x 4 days, hx of colitis. Pt c/o dark and bright red loose stools.

## 2012-08-12 NOTE — H&P (Signed)
[facility] History and Physical  Esau Fridman QIH:[id number] DOB: 07/16/1971 DOA: 08/12/2012  Referring physician: Dr. Lynelle Doctor PCP: Sheila Oats, MD  Specialists: None  Chief Complaint: abdominal pain  HPI: Ian Ruiz is a 41 y.o. male  41 year old male with as per patient past medical history ulcerative colitis currently on no medication also past medical history of polysubstance abuse that comes in for sudden onset of abdominal pain nausea and vomiting. He relates this started 2 days prior to admission progressively getting worse. He relates his pain radiates to his back. Taking inhaler mom makes it worse. Sitting curled up on his side makes it better.  - He also relates that for the past week he's been having diarrhea and mucousy and with intermittent bloody stools.  Review of Systems: The patient denies  fever, weight loss,, vision loss, decreased hearing, hoarseness, chest pain, syncope, dyspnea on exertion, peripheral edema, balance deficits, hemoptysis,   hematuria, incontinence, genital sores, muscle weakness, suspicious skin lesions, transient blindness, difficulty walking, depression, unusual weight change, abnormal bleeding, enlarged lymph nodes, angioedema, and breast masses.    Past Medical History  Diagnosis Date  . Ulcerative colitis   . Meniere disease   . Substance abuse    Past Surgical History  Procedure Date  . Fracture surgery    Social History:  reports that he quit smoking about 11 years ago. His smoking use included Cigarettes. He has a 10 pack-year smoking history. He has never used smokeless tobacco. He reports that he does not drink alcohol or use illicit drugs. Is at home with his cousin perform all his ADLs  Allergies  Allergen Reactions  . Clonidine Derivatives     Muscle spasms.  . Penicillins Rash    Family History  Problem Relation Age of Onset  . COPD Father   . Heart attack Father     Prior to Admission medications   Not  on File   Physical Exam: Filed Vitals:   08/12/12 1421 08/12/12 1816  BP: 114/95 112/74  Pulse: 88 68  Temp: 98.4 F (36.9 C) 98.6 F (37 C)  TempSrc: Oral Oral  Resp: 20   SpO2: 100% 96%     General:  Awake alert and oriented x3  Eyes: Anicteric  ENT: Dry mucous membranes  Neck: No JVD  Cardiovascular: Regular rate and rhythm with positive S1 and S2  Respiratory: Good air movement clear to auscultation  Abdomen: Positive bowel sounds was epigastric tenderness to palpation. No rebound or guarding  Skin: No rashes ulcerations  Musculoskeletal: Intact  Psychiatric: Appropriate  Neurologic: Alert and oriented x3 no acute distress  Labs on Admission:  Basic Metabolic Panel:  Lab 08/12/12 8756  NA 138  K 3.3*  CL 103  CO2 26  GLUCOSE 112*  BUN 12  CREATININE 1.08  CALCIUM 8.4  MG --  PHOS --   Liver Function Tests:  Lab 08/12/12 1656  AST 11  ALT 11  ALKPHOS 47  BILITOT 0.2*  PROT 6.6  ALBUMIN 3.0*    Lab 08/12/12 1656  LIPASE 225*  AMYLASE --   No results found for this basename: AMMONIA:5 in the last 168 hours CBC:  Lab 08/12/12 1656  WBC 6.9  NEUTROABS 4.4  HGB 12.5*  HCT 37.1*  MCV 88.1  PLT 291   Cardiac Enzymes: No results found for this basename: CKTOTAL:5,CKMB:5,CKMBINDEX:5,TROPONINI:5 in the last 168 hours  BNP (last 3 results) No results found for this basename: PROBNP:3 in the last 8760 hours CBG: No  results found for this basename: GLUCAP:5 in the last 168 hours  Radiological Exams on Admission: Dg Abd Acute W/chest  08/12/2012  *RADIOLOGY REPORT*  Clinical Data: Abdominal pain, nausea and bloody stool.  History of ulcerative colitis.  ACUTE ABDOMEN SERIES (ABDOMEN 2 VIEW & CHEST 1 VIEW)  Comparison: Chest x-ray 06/05/2012 and acute abdominalseries 09/08/2011 as well as recent chest CT 06/06/2012  Findings: Lungs are adequately inflated with a calcified granuloma over the right base.  Cardiomediastinal silhouette and  remainder of the chest is unchanged.  Bowel gas pattern is nonobstructive.  There is no free peritoneal air.  There is no mass or pathologic calcifications present. Remaining bones and soft tissues are unremarkable.  IMPRESSION: No acute findings.  The   Original Report Authenticated By: Elberta Fortis, M.D.     EKG: Independently reviewed.    Assessment/Plan Acute pancreatitis: - Abdominal pain with elevated lipase and radiation to the back. We'll go ahead and check a fasting lipid panel to check for triglycerides. Looking back through his old images he doesn't have a history of stone. The emergency room doctor has already ordered a CT scan. We'll see if there are stones or any dilation. He relates no new drug use. He's been using cocaine which has been linked to episodes of acute pancreatitis. He relates he has no recollection how his UDS was positive for cocaine. I've placed him n.p.o. start him on IV narcotics and IV fluids. And continue to monitor his pain. We'll place him n.p.o.  Polysubstance abuse - Denies having used cocaine. His UDS was positive for marijuana and cocaine. He does admit for cocaine. He denies any alcohol. We'll provide thiamine and monitor for any signs of withdrawals.  Code Status: full Family Communication: none Disposition Plan: home3-4 days (indicate anticipated LOS)  Time spent: [address] Rosine Beat [facility] Pager [phone]  If 7PM-7AM, please contact night-coverage [email or web address] Password [email or web address] 08/12/2012, 7:05 PM

## 2012-08-12 NOTE — ED Provider Notes (Signed)
History     CSN: [id number]  Arrival date & time 08/12/12  1417   First MD Initiated Contact with Patient 08/12/12 1519      Chief Complaint  Patient presents with  . Abdominal Pain    Patient is a 41 y.o. male presenting with abdominal pain. The history is provided by the patient.  Abdominal Pain The primary symptoms of the illness include abdominal pain, nausea, vomiting and diarrhea. The onset of the illness was gradual. The problem has been gradually worsening.  The abdominal pain is located in the epigastric region. Pain radiation: back. The abdominal pain is relieved by nothing.  Frequency: a couple times per day.  The diarrhea occurs 2 to 4 times per day.  Additional symptoms associated with the illness include anorexia. Associated medical issues comments: Pt states he has ulcerative colitis.  Does not take any medications for this.  Take over the counter acids.  Does not take daily medications.  No surgery..  The symptom have been getting worse.  Past Medical History  Diagnosis Date  . Ulcerative colitis   . Meniere disease   . Substance abuse     Past Surgical History  Procedure Date  . Fracture surgery     No family history on file.  History  Substance Use Topics  . Smoking status: Former Smoker -- 0.5 packs/day for 20 years    Quit date: 08/14/2001  . Smokeless tobacco: Never Used  . Alcohol Use: No     Comment: 1 month substance free  Quit alcohol in January.  Use to use drugs in the past.  None recently    Review of Systems  Gastrointestinal: Positive for nausea, vomiting, abdominal pain, diarrhea, blood in stool and anorexia.  All other systems reviewed and are negative.    Allergies  Clonidine derivatives and Penicillins  Home Medications  No current outpatient prescriptions on file.  BP 114/95  Pulse 88  Temp 98.4 F (36.9 C) (Oral)  Resp 20  SpO2 100%  Physical Exam  Nursing note and vitals reviewed. Constitutional: He appears  well-developed and well-nourished. He appears distressed.  HENT:  Head: Normocephalic and atraumatic.  Right Ear: External ear normal.  Left Ear: External ear normal.  Eyes: Conjunctivae normal are normal. Right eye exhibits no discharge. Left eye exhibits no discharge. No scleral icterus.  Neck: Neck supple. No tracheal deviation present.  Cardiovascular: Normal rate, regular rhythm and intact distal pulses.   Pulmonary/Chest: Effort normal and breath sounds normal. No stridor. No respiratory distress. He has no wheezes. He has no rales.  Abdominal: Soft. Bowel sounds are normal. He exhibits no distension. There is tenderness in the epigastric area. There is guarding. There is no rigidity, no rebound and no CVA tenderness. No hernia.  Musculoskeletal: He exhibits no edema and no tenderness.  Neurological: He is alert. He has normal strength. No sensory deficit. Cranial nerve deficit:  no gross defecits noted. He exhibits normal muscle tone. He displays no seizure activity. Coordination normal.  Skin: Skin is warm. No rash noted. He is diaphoretic.  Psychiatric: He has a normal mood and affect.    ED Course  Procedures (including critical care time)  Medications  0.9 %  sodium chloride infusion (1000 mL Intravenous New Bag/Given 08/12/12 1707)    Followed by  0.9 %  sodium chloride infusion (0 mL Intravenous Stopped 08/12/12 1700)    Followed by  0.9 %  sodium chloride infusion (not administered)  HYDROmorphone (DILAUDID) 2 MG/ML  injection (   Not Given 08/12/12 1549)  HYDROmorphone (DILAUDID) injection 1 mg (not administered)  HYDROmorphone (DILAUDID) injection 1 mg (1 mg Intravenous Given 08/12/12 1549)  ondansetron (ZOFRAN) injection 4 mg (4 mg Intravenous Given 08/12/12 1547)  HYDROmorphone (DILAUDID) injection 1 mg (1 mg Intravenous Given 08/12/12 1637)  6:09 PM Pt still having severe pain  Labs Reviewed  URINE RAPID DRUG SCREEN (HOSP PERFORMED) - Abnormal; Notable for the  following:    Cocaine POSITIVE (*)     Tetrahydrocannabinol POSITIVE (*)     All other components within normal limits  CBC - Abnormal; Notable for the following:    RBC 4.21 (*)     Hemoglobin 12.5 (*)     HCT 37.1 (*)     All other components within normal limits  COMPREHENSIVE METABOLIC PANEL - Abnormal; Notable for the following:    Potassium 3.3 (*)     Glucose, Bld 112 (*)     Albumin 3.0 (*)     Total Bilirubin 0.2 (*)     GFR calc non Af Amer 82 (*)     All other components within normal limits  LIPASE, BLOOD - Abnormal; Notable for the following:    Lipase 225 (*)     All other components within normal limits  URINALYSIS, ROUTINE W REFLEX MICROSCOPIC  ETHANOL  DIFFERENTIAL   Dg Abd Acute W/chest  08/12/2012  *RADIOLOGY REPORT*  Clinical Data: Abdominal pain, nausea and bloody stool.  History of ulcerative colitis.  ACUTE ABDOMEN SERIES (ABDOMEN 2 VIEW & CHEST 1 VIEW)  Comparison: Chest x-ray 06/05/2012 and acute abdominalseries 09/08/2011 as well as recent chest CT 06/06/2012  Findings: Lungs are adequately inflated with a calcified granuloma over the right base.  Cardiomediastinal silhouette and remainder of the chest is unchanged.  Bowel gas pattern is nonobstructive.  There is no free peritoneal air.  There is no mass or pathologic calcifications present. Remaining bones and soft tissues are unremarkable.  IMPRESSION: No acute findings.  The   Original Report Authenticated By: Elberta Fortis, M.D.      1. Pancreatitis       MDM  Pt is a recovering substance abuser who presents with severe abdominal pain.  Questionable history of ulcerative colitis.  He states he does not take any medications for this and does not have a doctor.  I am not sure where that initial diagnosis comes from.  Labs suggest pancreatitis.  He denies alcohol or drug use since January however his drug screen is positive for cocaine.  Will plan on admission for pain management.        Celene Kras, MD 08/12/12 [phone]

## 2012-08-13 DIAGNOSIS — R52 Pain, unspecified: Secondary | ICD-10-CM

## 2012-08-13 DIAGNOSIS — R1032 Left lower quadrant pain: Secondary | ICD-10-CM

## 2012-08-13 DIAGNOSIS — R1031 Right lower quadrant pain: Secondary | ICD-10-CM

## 2012-08-13 DIAGNOSIS — N179 Acute kidney failure, unspecified: Secondary | ICD-10-CM

## 2012-08-13 LAB — LIPID PANEL
HDL: 34 mg/dL — ABNORMAL LOW (ref >39)
Triglycerides: 293 mg/dL — ABNORMAL HIGH (ref <150)

## 2012-08-13 LAB — COMPREHENSIVE METABOLIC PANEL
CO2: 25 mEq/L (ref 19–32)
Calcium: 8.2 mg/dL — ABNORMAL LOW (ref 8.4–10.5)
Creatinine, Ser: 1.04 mg/dL (ref 0.50–1.35)
GFR calc Af Amer: >90 mL/min (ref >90)
GFR calc non Af Amer: 86 mL/min — ABNORMAL LOW (ref >90)
Glucose, Bld: 108 mg/dL — ABNORMAL HIGH (ref 70–99)

## 2012-08-13 LAB — CBC
MCH: 29.5 pg (ref 26.0–34.0)
MCV: 89.9 fL (ref 78.0–100.0)
Platelets: 254 10*3/uL (ref 150–400)
RBC: 4.04 MIL/uL — ABNORMAL LOW (ref 4.22–5.81)

## 2012-08-13 MED ORDER — HYDROMORPHONE HCL PF 1 MG/ML IJ SOLN
1.0000 mg | INTRAMUSCULAR | Status: DC | PRN
  Administered 2012-08-13 (×2): 1.5 mg via INTRAVENOUS
  Administered 2012-08-13: 0.5 mg via INTRAVENOUS
  Administered 2012-08-13 (×2): 1.5 mg via INTRAVENOUS
  Filled 2012-08-13: qty 1
  Filled 2012-08-13 (×4): qty 2

## 2012-08-13 MED ORDER — HYDROMORPHONE HCL PF 2 MG/ML IJ SOLN
2.0000 mg | INTRAMUSCULAR | Status: DC | PRN
  Administered 2012-08-13 – 2012-08-14 (×7): 2 mg via INTRAVENOUS
  Filled 2012-08-13 (×7): qty 1

## 2012-08-13 NOTE — Progress Notes (Signed)
Patient ID: Ian Ruiz, male   DOB: 03-21-1971, 41 y.o.   MRN: [id number]  [facility] PROGRESS NOTE  Alam Guterrez WUJ:[id number] DOB: 1970-11-14 DOA: 08/12/2012 PCP: Sheila Oats, MD  Brief narrative: Pt is 41 yo male with past medical history ulcerative colitis currently on no medications, polysubstance abuse, that came in for sudden onset of epigastric pain associated with nausea and vomiting that started 2 days prior to admission and with no specific alleviating factors, aggravated by food,radiating to the back.  Principal Problem:  *Acute pancreatitis - slight clinical improvement - will continue supportive care with IVF, antiemetics, analgesia as needed - will check lipase in AM - plan advancing diet to clear liquids if pt toelrating Active Problems:  Leukocytosis - likely secondary to an acute process  - resolved - CBC In AM  Polysubstance abuse - consult for cessation provided  Consultants:  None  Procedures/Studies: Ct Abdomen Pelvis Wo Contrast 08/12/2012  No renal, ureteral, or bladder calculi.   No hydronephrosis.  No evidence of bowel obstruction.  Normal appendix.   Possible mild colonic wall thickening involving the left colon, equivocal, correlate for infectious/inflammatory colitis.    Dg Abd Acute W/chest 08/12/2012   No acute findings.   Antibiotics:  None  Code Status: Full Family Communication: Pt at bedside Disposition Plan: Home when medically stable  HPI/Subjective: No events overnight.   Objective: Filed Vitals:   08/12/12 1816 08/12/12 2019 08/12/12 2300 08/13/12 0519  BP: 112/74 131/93  105/68  Pulse: 68 64  58  Temp: 98.6 F (37 C) 97.5 F (36.4 C)  97.9 F (36.6 C)  TempSrc: Oral Oral  Oral  Resp:  18  18  Height:   5\' 10"  (1.778 m)   Weight:  80.6 kg (177 lb 11.1 oz)    SpO2: 96% 99%  96%    Intake/Output Summary (Last 24 hours) at 08/13/12 1208 Last data filed at 08/13/12 1000  Gross per 24 hour   Intake   1277 ml  Output    600 ml  Net    677 ml    Exam:   General:  Pt is alert, follows commands appropriately, not in acute distress  Cardiovascular: Regular rate and rhythm, S1/S2, no murmurs, no rubs, no gallops  Respiratory: Clear to auscultation bilaterally, no wheezing, no crackles, no rhonchi  Abdomen: Soft, tender in epigastric area, non distended, bowel sounds present, no guarding  Extremities: No edema, pulses DP and PT palpable bilaterally  Neuro: Grossly nonfocal  Data Reviewed: Basic Metabolic Panel:  Lab 08/13/12 9562 08/12/12 1656  NA 136 138  K 3.6 3.3*  CL 103 103  CO2 25 26  GLUCOSE 108* 112*  BUN 12 12  CREATININE 1.04 1.08  CALCIUM 8.2* 8.4  MG -- --  PHOS -- --   Liver Function Tests:  Lab 08/13/12 0536 08/12/12 1656  AST 14 1114  ALT 12 1111  ALKPHOS 43 4748  BILITOT 0.2* 0.2*0.2*  PROT 5.9* 6.66.4  ALBUMIN 2.9* 3.0*3.1*    Lab 08/12/12 1656  LIPASE 225*  AMYLASE --   CBC:  Lab 08/13/12 0536 08/12/12 1656  WBC 6.9 6.9  NEUTROABS -- 4.4  HGB 11.9* 12.5*  HCT 36.3* 37.1*  MCV 89.9 88.1  PLT 254 291    Scheduled Meds:   . heparin  5,000 Units Subcutaneous Q8H  . pantoprazole (PROTONIX) IV  40 mg Intravenous QHS   Continuous Infusions:   . sodium chloride 1,000 mL (08/13/12 1131)  Debbora Presto, MD  [facility] Pager [phone]  If 7PM-7AM, please contact night-coverage [email or web address] Password [email or web address] 08/13/2012, 12:08 PM   LOS: 1 day

## 2012-08-14 ENCOUNTER — Encounter: Payer: Self-pay | Admitting: Internal Medicine

## 2012-08-14 LAB — BASIC METABOLIC PANEL
CO2: 26 mEq/L (ref 19–32)
Calcium: 8.4 mg/dL (ref 8.4–10.5)
Chloride: 102 mEq/L (ref 96–112)
Glucose, Bld: 108 mg/dL — ABNORMAL HIGH (ref 70–99)
Potassium: 3.7 mEq/L (ref 3.5–5.1)
Sodium: 136 mEq/L (ref 135–145)

## 2012-08-14 LAB — CBC
HCT: 36.3 % — ABNORMAL LOW (ref 39.0–52.0)
MCV: 88.8 fL (ref 78.0–100.0)
Platelets: 242 10*3/uL (ref 150–400)

## 2012-08-14 LAB — LIPASE, BLOOD: Lipase: 53 U/L (ref 11–59)

## 2012-08-14 MED ORDER — ZOLPIDEM TARTRATE 5 MG PO TABS
5.0000 mg | ORAL_TABLET | Freq: Once | ORAL | Status: AC
  Administered 2012-08-15: 5 mg via ORAL
  Filled 2012-08-14: qty 1

## 2012-08-14 MED ORDER — HYDROMORPHONE HCL PF 2 MG/ML IJ SOLN
2.0000 mg | INTRAMUSCULAR | Status: DC | PRN
  Administered 2012-08-14 – 2012-08-15 (×9): 2 mg via INTRAVENOUS
  Filled 2012-08-14 (×9): qty 1

## 2012-08-14 MED ORDER — DIPHENHYDRAMINE HCL 25 MG PO CAPS
25.0000 mg | ORAL_CAPSULE | Freq: Four times a day (QID) | ORAL | Status: DC | PRN
  Administered 2012-08-14 – 2012-08-15 (×3): 25 mg via ORAL
  Filled 2012-08-14 (×3): qty 1

## 2012-08-14 MED ORDER — HYDROMORPHONE HCL PF 1 MG/ML IJ SOLN
1.0000 mg | INTRAMUSCULAR | Status: DC | PRN
  Administered 2012-08-14 (×3): 1 mg via INTRAVENOUS
  Filled 2012-08-14 (×3): qty 1

## 2012-08-14 NOTE — Progress Notes (Signed)
Patient ID: Ian Ruiz, male   DOB: 08-08-1971, 41 y.o.   MRN: [id number]  [facility] PROGRESS NOTE  Daryn Hicks WUJ:[id number] DOB: 12/30/1970 DOA: 08/12/2012 PCP: Sheila Oats, MD  Brief narrative:  Pt is 41 yo male with past medical history ulcerative colitis currently on no medications, polysubstance abuse, that came in for sudden onset of epigastric pain associated with nausea and vomiting that started 2 days prior to admission and with no specific alleviating factors, aggravated by food,radiating to the back.   Principal Problem:  *Acute pancreatitis  - slight clinical improvement but pt was apparently eating overnight - will continue supportive care with IVF, antiemetics, analgesia as needed  - strict NPO - plan advancing diet to clear liquids if pt toelrating  Active Problems:  Leukocytosis  - likely secondary to an acute process  - resolved  - CBC In AM  Polysubstance abuse  - consult for cessation provided   Consultants:  None  Procedures/Studies:  Ct Abdomen Pelvis Wo Contrast  08/12/2012  No renal, ureteral, or bladder calculi.  No hydronephrosis. No evidence of bowel obstruction. Normal appendix.  Possible mild colonic wall thickening involving the left colon, equivocal, correlate for infectious/inflammatory colitis.   Dg Abd Acute W/chest  08/12/2012  No acute findings.   Antibiotics:  None  Code Status: Full  Family Communication: Pt at bedside  Disposition Plan: Home when medically stable   HPI/Subjective: No events overnight.   Objective: Filed Vitals:   08/13/12 0519 08/13/12 1520 08/13/12 2240 08/14/12 0515  BP: 105/68 114/85 134/89 108/74  Pulse: 58 58 63 60  Temp: 97.9 F (36.6 C) 98.1 F (36.7 C) 97.5 F (36.4 C) 97.5 F (36.4 C)  TempSrc: Oral Oral Oral Oral  Resp: 18 18 20 20   Height:      Weight:      SpO2: 96% 98% 98% 98%    Intake/Output Summary (Last 24 hours) at 08/14/12 1055 Last data filed at 08/14/12  1028  Gross per 24 hour  Intake 3154.58 ml  Output   3050 ml  Net 104.58 ml    Exam:   General:  Pt is alert, follows commands appropriately, not in acute distress  Cardiovascular: Regular rate and rhythm, S1/S2, no murmurs, no rubs, no gallops  Respiratory: Clear to auscultation bilaterally, no wheezing, no crackles, no rhonchi  Abdomen: Soft, non tender, non distended, bowel sounds present, no guarding  Extremities: No edema, pulses DP and PT palpable bilaterally  Neuro: Grossly nonfocal  Data Reviewed: Basic Metabolic Panel:  Lab 08/14/12 9562 08/13/12 0536 08/12/12 1656  NA 136 136 138  K 3.7 3.6 3.3*  CL 102 103 103  CO2 26 25 26   GLUCOSE 108* 108* 112*  BUN 7 12 12   CREATININE 0.95 1.04 1.08  CALCIUM 8.4 8.2* 8.4  MG -- -- --  PHOS -- -- --   Liver Function Tests:  Lab 08/13/12 0536 08/12/12 1656  AST 14 1114  ALT 12 1111  ALKPHOS 43 4748  BILITOT 0.2* 0.2*0.2*  PROT 5.9* 6.66.4  ALBUMIN 2.9* 3.0*3.1*    Lab 08/14/12 0525 08/12/12 1656  LIPASE 53 225*  AMYLASE -- --   CBC:  Lab 08/14/12 0525 08/13/12 0536 08/12/12 1656  WBC 5.9 6.9 6.9  NEUTROABS -- -- 4.4  HGB 11.9* 11.9* 12.5*  HCT 36.3* 36.3* 37.1*  MCV 88.8 89.9 88.1  PLT 242 254 291     Scheduled Meds:   . heparin  5,000 Units Subcutaneous Q8H  . pantoprazole (  PROTONIX) IV  40 mg Intravenous QHS   Continuous Infusions:   . sodium chloride 1,000 mL (08/14/12 0303)     Debbora Presto, MD  [facility] Pager [phone]  If 7PM-7AM, please contact night-coverage [email or web address] Password [email or web address] 08/14/2012, 10:55 AM   LOS: 2 days

## 2012-08-15 LAB — BASIC METABOLIC PANEL
CO2: 30 mEq/L (ref 19–32)
Chloride: 97 mEq/L (ref 96–112)
Glucose, Bld: 100 mg/dL — ABNORMAL HIGH (ref 70–99)
Potassium: 3.5 mEq/L (ref 3.5–5.1)
Sodium: 136 mEq/L (ref 135–145)

## 2012-08-15 LAB — CBC
Hemoglobin: 13.5 g/dL (ref 13.0–17.0)
RBC: 4.51 MIL/uL (ref 4.22–5.81)

## 2012-08-15 MED ORDER — OXYCODONE HCL 5 MG PO TABS
10.0000 mg | ORAL_TABLET | ORAL | Status: DC | PRN
  Administered 2012-08-15: 10 mg via ORAL
  Filled 2012-08-15: qty 2

## 2012-08-15 MED ORDER — OXYCODONE HCL 5 MG PO TABS
15.0000 mg | ORAL_TABLET | ORAL | Status: DC | PRN
  Administered 2012-08-15: 15 mg via ORAL
  Filled 2012-08-15: qty 3

## 2012-08-15 MED ORDER — OXYCODONE HCL 15 MG PO TABS: 15.0000 mg | ORAL_TABLET | ORAL | Status: DC | PRN

## 2012-08-15 NOTE — Discharge Summary (Signed)
Physician Discharge Summary  Ian Ruiz ZOX:[id number] DOB: April 07, 1971 DOA: 08/12/2012  PCP: Default, Provider, MD  Admit date: 08/12/2012 Discharge date: 08/15/2012  Recommendations for Outpatient Follow-up:  1. Pt will need to follow up with PCP in 2-3 weeks post discharge 2. Please obtain BMP to evaluate electrolytes and kidney function 3. Please also check CBC to evaluate Hg and Hct levels 4. Pt has an appointment scheduled with GI specialist and he was made aware of the appointment  5. Please note that pt insisted to be discharged today  Discharge Diagnoses: Acute pancreatitis secondary to alcohol abuse  Principal Problem:  *Acute pancreatitis Active Problems:  Leukocytosis  Polysubstance abuse  Discharge Condition: Stable  Diet recommendation: Heart healthy diet discussed in details   Brief narrative:  Pt is 41 yo male with past medical history ulcerative colitis currently on no medications, polysubstance abuse, that came in for sudden onset of epigastric pain associated with nausea and vomiting that started 2 days prior to admission and with no specific alleviating factors, aggravated by food,radiating to the back.   Principal Problem:  *Acute pancreatitis  - slight clinical improvement  - continued supportive care with IVF, antiemetics, analgesia as needed  - transitioned to PO analgesia and advanced diet as pt able to tolerate  - pt insisting on being discharged today  Active Problems:  Leukocytosis  - likely secondary to an acute process  - resolved  Polysubstance abuse  - consult for cessation provided  Consultants:  None Procedures/Studies:  Ct Abdomen Pelvis Wo Contrast  08/12/2012  No renal, ureteral, or bladder calculi.  No hydronephrosis. No evidence of bowel obstruction. Normal appendix.  Possible mild colonic wall thickening involving the left colon, equivocal, correlate for infectious/inflammatory colitis.  Dg Abd Acute W/chest  08/12/2012   No acute findings.  Antibiotics:  None Code Status: Full  Family Communication: Pt at bedside   Discharge Exam: Filed Vitals:   08/15/12 0506  BP: 113/75  Pulse: 67  Temp: 98.3 F (36.8 C)  Resp: 18   Filed Vitals:   08/14/12 0515 08/14/12 1415 08/14/12 2113 08/15/12 0506  BP: 108/74 115/65 130/85 113/75  Pulse: 60 81 69 67  Temp: 97.5 F (36.4 C) 97.9 F (36.6 C) 98.4 F (36.9 C) 98.3 F (36.8 C)  TempSrc: Oral Oral Oral Oral  Resp: 20 20 18 18   Height:      Weight:      SpO2: 98% 97% 97% 97%    General: Pt is alert, follows commands appropriately, not in acute distress Cardiovascular: Regular rate and rhythm, S1/S2 +, no murmurs, no rubs, no gallops Respiratory: Clear to auscultation bilaterally, no wheezing, no crackles, no rhonchi Abdominal: Soft, non tender, non distended, bowel sounds +, no guarding Extremities: no edema, no cyanosis, pulses palpable bilaterally DP and PT Neuro: Grossly nonfocal  Discharge Instructions  Discharge Orders    Future Appointments: Provider: Department: Dept Phone: Center:   08/24/2012 10:30 AM Amy Oswald Hillock, PA [facility] [phone] [facility]     Future Orders Please Complete By Expires   Diet - low sodium heart healthy      Increase activity slowly          Medication List     As of 08/15/2012  2:33 PM    TAKE these medications         oxyCODONE 15 MG immediate release tablet   Commonly known as: ROXICODONE   Take 1 tablet (15 mg total) by mouth every 3 (three)  hours as needed.           Follow-up Information    Follow up with Mike Gip, PA. On 08/24/2012. (at 10:30am)    Contact information:   [address] [place] [address] [phone]           The results of significant diagnostics from this hospitalization (including imaging, microbiology, ancillary and laboratory) are listed below for reference.     Microbiology: No results found  for this or any previous visit (from the past 240 hour(s)).   Labs: Basic Metabolic Panel:  Lab 08/15/12 5784 08/14/12 0525 08/13/12 0536 08/12/12 1656  NA 136 136 136 138  K 3.5 3.7 3.6 3.3*  CL 97 102 103 103  CO2 30 26 25 26   GLUCOSE 100* 108* 108* 112*  BUN 5* 7 12 12   CREATININE 1.02 0.95 1.04 1.08  CALCIUM 8.9 8.4 8.2* 8.4  MG -- -- -- --  PHOS -- -- -- --   Liver Function Tests:  Lab 08/13/12 0536 08/12/12 1656  AST 14 1114  ALT 12 1111  ALKPHOS 43 4748  BILITOT 0.2* 0.2*0.2*  PROT 5.9* 6.66.4  ALBUMIN 2.9* 3.0*3.1*    Lab 08/15/12 0558 08/14/12 0525 08/12/12 1656  LIPASE 33 53 225*  AMYLASE -- -- --   CBC:  Lab 08/15/12 0558 08/14/12 0525 08/13/12 0536 08/12/12 1656  WBC 5.5 5.9 6.9 6.9  NEUTROABS -- -- -- 4.4  HGB 13.5 11.9* 11.9* 12.5*  HCT 39.5 36.3* 36.3* 37.1*  MCV 87.6 88.8 89.9 88.1  PLT 250 242 254 291   SIGNED: Time coordinating discharge: Over 30 minutes  Debbora Presto, MD  [facility] 08/15/2012, 2:33 PM Pager [phone]  If 7PM-7AM, please contact night-coverage [email or web address] Password [email or web address]

## 2012-08-15 NOTE — Progress Notes (Signed)
Pt reports burning pain at IV site. States "I don't know if I got that last dose of Dilaudid." IV infusing, no signs of infiltration or occlusion. Pt requesting additional dose of Dilaudid due to lack of pain relief. NP notified. No new orders at this time. Will continue to monitor. Yazleen Molock, Lavone Orn, RN

## 2012-08-15 NOTE — Progress Notes (Signed)
Patient ID: Ian Ruiz, male   DOB: 02-13-1971, 41 y.o.   MRN: [id number]  [facility] PROGRESS NOTE  Ian Ruiz NWG:[id number] DOB: [birth year] DOA: 08/12/2012 PCP: Default, Provider, MD  Brief narrative:  Pt is 41 yo male with past medical history ulcerative colitis currently on no medications, polysubstance abuse, that came in for sudden onset of epigastric pain associated with nausea and vomiting that started 2 days prior to admission and with no specific alleviating factors, aggravated by food,radiating to the back.   Principal Problem:  *Acute pancreatitis  - slight clinical improvement  - will continue supportive care with IVF, antiemetics, analgesia as needed  - transition to PO analgesia and advance diet as pt able to tolerate  Active Problems:  Leukocytosis  - likely secondary to an acute process  - resolved  - CBC In AM  Polysubstance abuse  - consult for cessation provided   Consultants:  None Procedures/Studies:  Ct Abdomen Pelvis Wo Contrast  08/12/2012  No renal, ureteral, or bladder calculi.  No hydronephrosis. No evidence of bowel obstruction. Normal appendix.  Possible mild colonic wall thickening involving the left colon, equivocal, correlate for infectious/inflammatory colitis.  Dg Abd Acute W/chest  08/12/2012  No acute findings.  Antibiotics:  None  Code Status: Full  Family Communication: Pt at bedside  Disposition Plan: Home when medically stable   HPI/Subjective: No events overnight.   Objective: Filed Vitals:   08/14/12 0515 08/14/12 1415 08/14/12 2113 08/15/12 0506  BP: 108/74 115/65 130/85 113/75  Pulse: 60 81 69 67  Temp: 97.5 F (36.4 C) 97.9 F (36.6 C) 98.4 F (36.9 C) 98.3 F (36.8 C)  TempSrc: Oral Oral Oral Oral  Resp: 20 20 18 18   Height:      Weight:      SpO2: 98% 97% 97% 97%    Intake/Output Summary (Last 24 hours) at 08/15/12 1253 Last data filed at 08/15/12 1200  Gross per 24 hour  Intake 3029.17 ml   Output   3775 ml  Net -745.83 ml    Exam:   General:  Pt is alert, follows commands appropriately, not in acute distress  Cardiovascular: Regular rate and rhythm, S1/S2, no murmurs, no rubs, no gallops  Respiratory: Clear to auscultation bilaterally, no wheezing, no crackles, no rhonchi  Abdomen: Soft, non tender, non distended, bowel sounds present, no guarding  Extremities: No edema, pulses DP and PT palpable bilaterally  Neuro: Grossly nonfocal  Data Reviewed: Basic Metabolic Panel:  Lab 08/15/12 5784 08/14/12 0525 08/13/12 0536 08/12/12 1656  NA 136 136 136 138  K 3.5 3.7 3.6 3.3*  CL 97 102 103 103  CO2 30 26 25 26   GLUCOSE 100* 108* 108* 112*  BUN 5* 7 12 12   CREATININE 1.02 0.95 1.04 1.08  CALCIUM 8.9 8.4 8.2* 8.4  MG -- -- -- --  PHOS -- -- -- --   Liver Function Tests:  Lab 08/13/12 0536 08/12/12 1656  AST 14 1114  ALT 12 1111  ALKPHOS 43 4748  BILITOT 0.2* 0.2*0.2*  PROT 5.9* 6.66.4  ALBUMIN 2.9* 3.0*3.1*    Lab 08/15/12 0558 08/14/12 0525 08/12/12 1656  LIPASE 33 53 225*  AMYLASE -- -- --   CBC:  Lab 08/15/12 0558 08/14/12 0525 08/13/12 0536 08/12/12 1656  WBC 5.5 5.9 6.9 6.9  NEUTROABS -- -- -- 4.4  HGB 13.5 11.9* 11.9* 12.5*  HCT 39.5 36.3* 36.3* 37.1*  MCV 87.6 88.8 89.9 88.1  PLT 250 242 254 291  Scheduled Meds:   . heparin  5,000 Units Subcutaneous Q8H  . pantoprazole (PROTONIX) IV  40 mg Intravenous QHS   Continuous Infusions:   . sodium chloride 1,000 mL (08/15/12 1135)     Debbora Presto, MD  [facility] Pager [phone]  If 7PM-7AM, please contact night-coverage [email or web address] Password [email or web address] 08/15/2012, 12:53 PM   LOS: 3 days

## 2012-08-15 NOTE — Progress Notes (Signed)
Pt called out for more pain medicine.  When nurse entered room, pt had showered and was walking around the room. Pt immediately grimaced and crawled back into bed into a fetal position stating he was in such pain and the pain medicine wasn't working.  Explained to pt that it would be another hour or so before he could have pain med. Insisted that I call MD.  MD notified, will continue to monitor.

## 2012-08-15 NOTE — Progress Notes (Signed)
Pt requesting to speak to CN regarding pain medication. Pt stated that "oxycodone 10mg  IR would not be able to control his pain after taking Dilaudid". Pt stated that that would not be "enough to control his pain and his withdrawals". Education provided to patient about transitioning of IV pain medication and that PO pain medication can last longer. Pt stated that it would not work. Encouraged patient to try pain meds and if they did not work we would talk with MD. Pt stated that he tried to tell MD what would work for him since he knows his body. Pt also stated that the MD never called him back after he told her to wait to call him because he was already on the phone. Educated pt that MD will call him back. MD paged regarding pt wishes to speak with him. Maida Widger A

## 2012-08-15 NOTE — Progress Notes (Signed)
Pt reporting no pain relief from prn Dilaudid. NP notified. No new orders at this time. Next Dilaudid dose at 0105. V/S stable. Will continue to monitor. Nataliee Shurtz, Lavone Orn, RN

## 2012-08-15 NOTE — Progress Notes (Signed)
Pt discussed with MD his pain control and insisted that the Dilaudid wasn't as effective as before and asking for equivalent doses of morphine.  Pt stated that Percocet would not work for his pain, and was insisting that the MD consider other methods of pain control.  MD stated that she would consult a pharmacist to find the best oral medication for patient.  His diet would be advanced and if tolerated, pt is ready for discharge.  MD stressed that pt had to transition to oral pain medication for discharge.  Pt was argumentative with MD and later questioned said nurse as well after MD left the room, asking for my opinion and reiterating his objections to oral pain medicine as well.

## 2012-08-24 ENCOUNTER — Ambulatory Visit: Payer: Self-pay | Admitting: Physician Assistant

## 2012-10-22 IMAGING — CT CT ABD-PELV W/ CM
2 of 5 series · 17 of 46 positions shown, 19 images · IV contrast (APPLIED)
Comparison: 09/19/2011

CLINICAL DATA: Abdominal pain and fever

CT ABDOMEN AND PELVIS WITH CONTRAST
TECHNIQUE: Multidetector CT imaging of the abdomen and pelvis was
performed following the standard protocol during bolus
administration of intravenous contrast.
Contrast:  100 ml Isovue 300

[Series 2: rtn ap with st · axial · 0.78mm/px · z∈[-376,+38]mm · 14 of 95 slices shown, 16 images]
[im 6/95  soft-tissue]
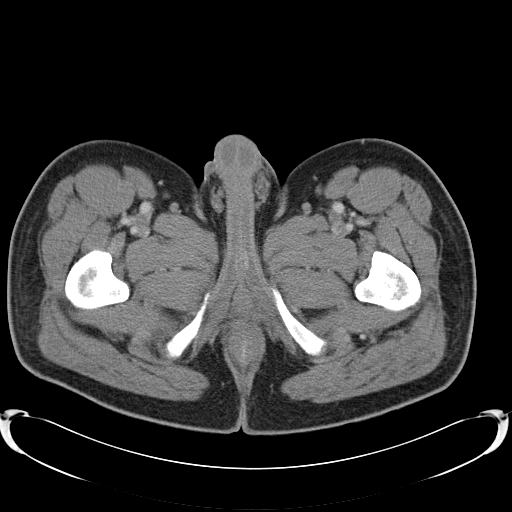
[im 6/95  bone]
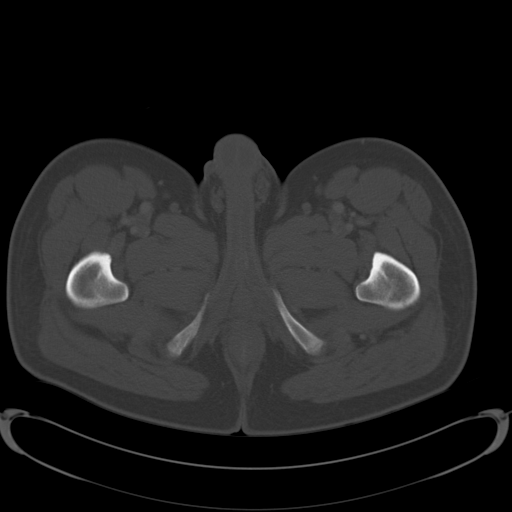
[im 11/95  soft-tissue]
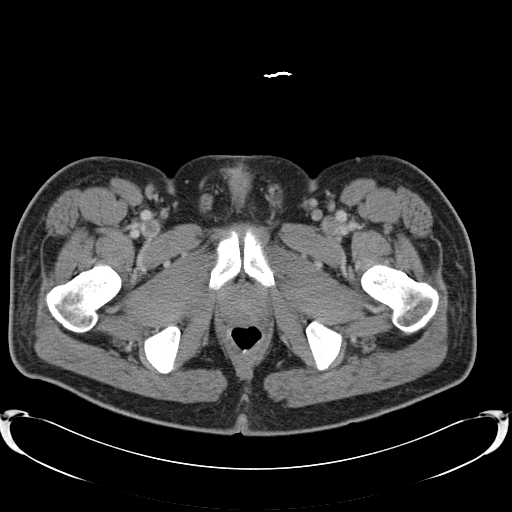
[im 21/95  soft-tissue]
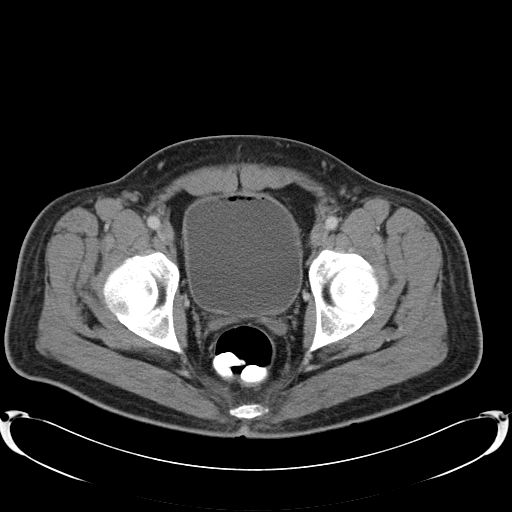
[im 27/95  soft-tissue]
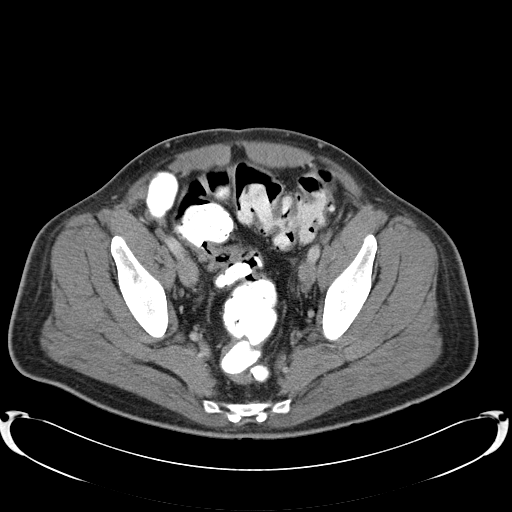
[im 32/95  soft-tissue]
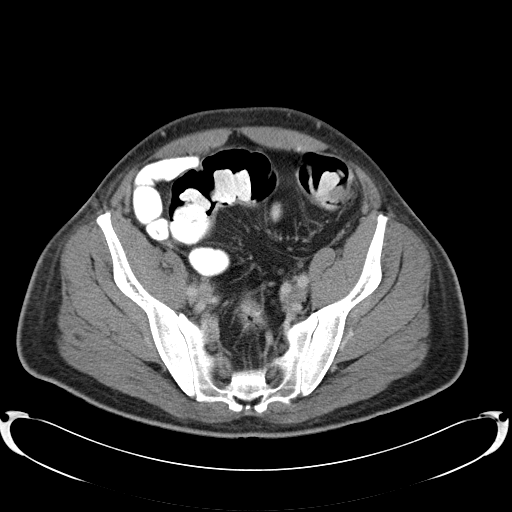
[im 37/95  soft-tissue]
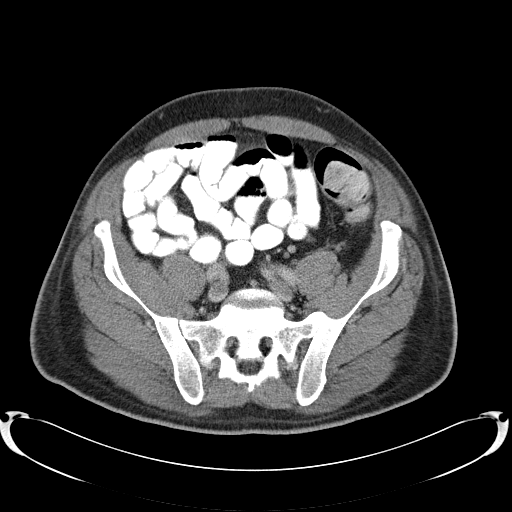
[im 42/95  soft-tissue]
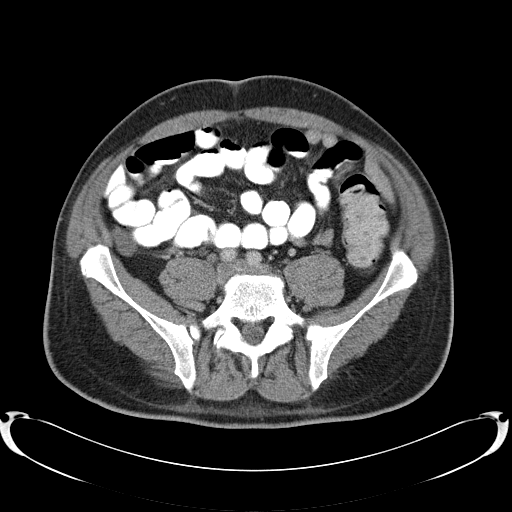
[im 53/95  soft-tissue]
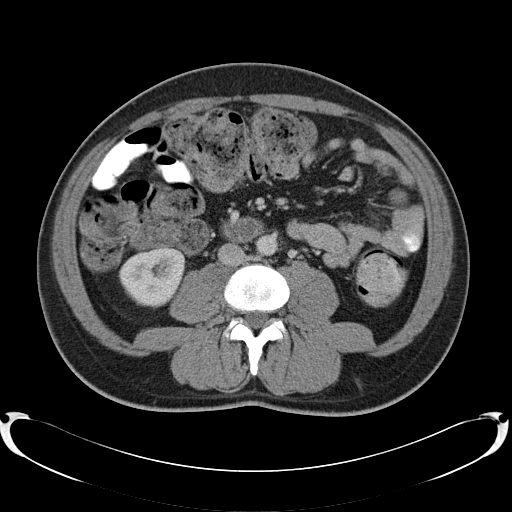
[im 58/95  soft-tissue]
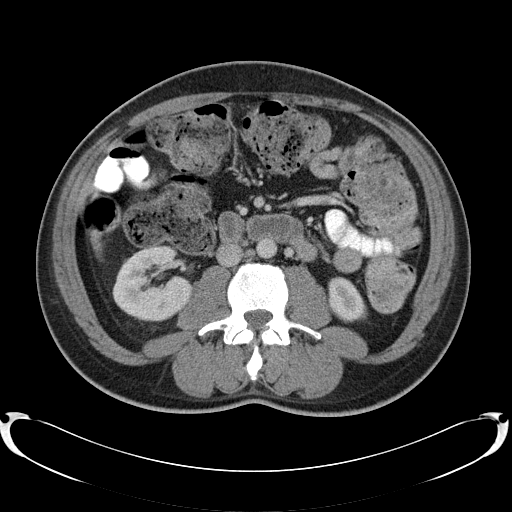
[im 58/95  bone]
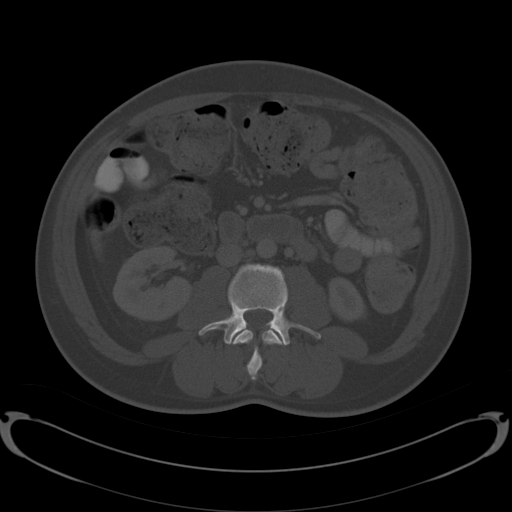
[im 63/95  soft-tissue]
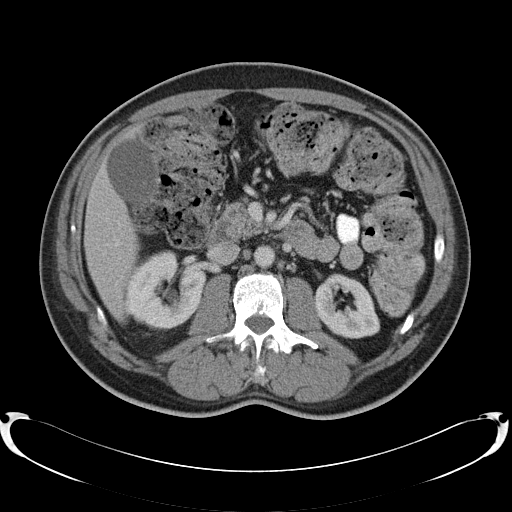
[im 68/95  soft-tissue]
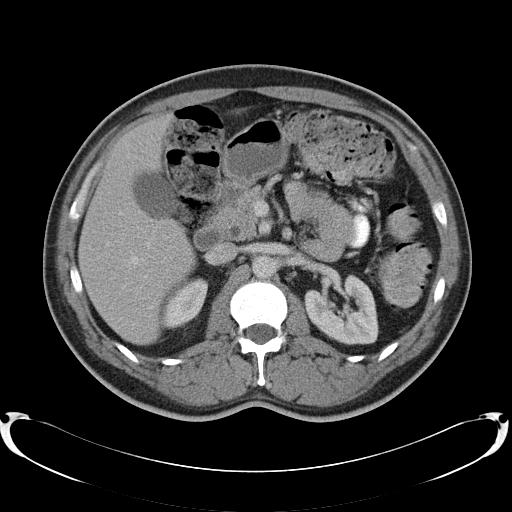
[im 74/95  soft-tissue]
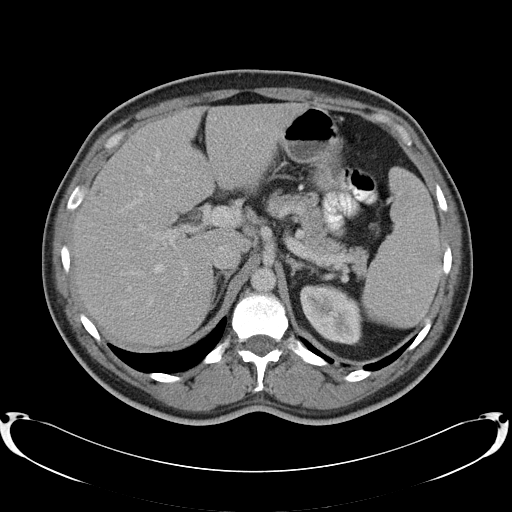
[im 84/95  soft-tissue]
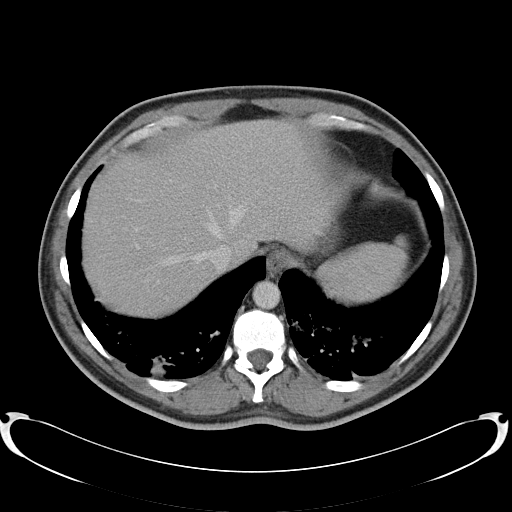
[im 89/95  soft-tissue]
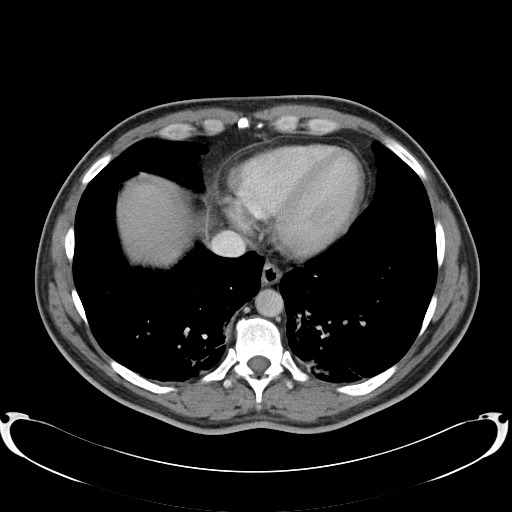

[Series 602: coronal · coronal · 0.96mm/px · 3 of 86 slices shown]
[im 29/86  soft-tissue]
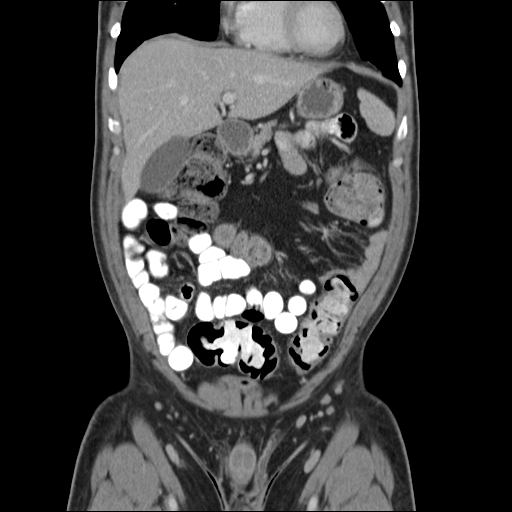
[im 38/86  soft-tissue]
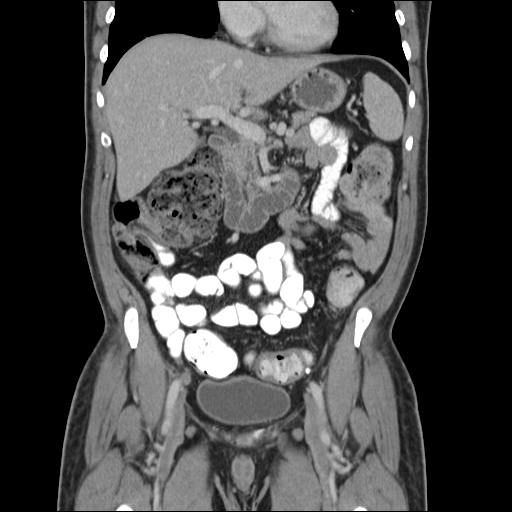
[im 48/86  soft-tissue]
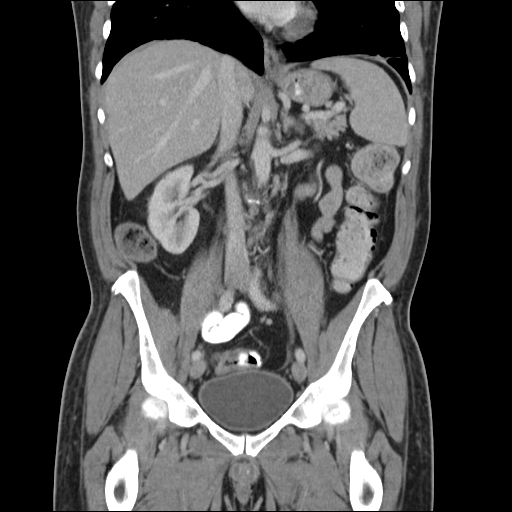

[17 of 46 positions shown; findings below may reference images not displayed]

FINDINGS: Heterogeneous opacities have developed at both lung bases
worrisome for airspace disease.

Liver, gallbladder, spleen, adrenal glands, pancreas are within
normal limits.  Extensive amount of stool throughout the length of
the colon.  Tiny hypodensities in the kidneys are stable.

Free fluid has nearly resolved.  There is a tiny amount of residual
ascites in the pelvis.  Gas is noted in the bladder right the from
recent catheterization.  Unremarkable prostate.

Normal appendix.
IMPRESSION: Extensive stool.

Bibasilar patchy airspace disease.

## 2012-10-25 IMAGING — CR DG CHEST 2V
2 series · 2 of 2 positions shown · non-contrast
Comparison: 09/30/2011; 09/19/2011; CT abdomen pelvis - 09/30/2011

CLINICAL DATA: Recently diagnosed with pneumonia, now with
shortness of breath, coughing and chest discomfort

CHEST - 2 VIEW

[w chest pa]
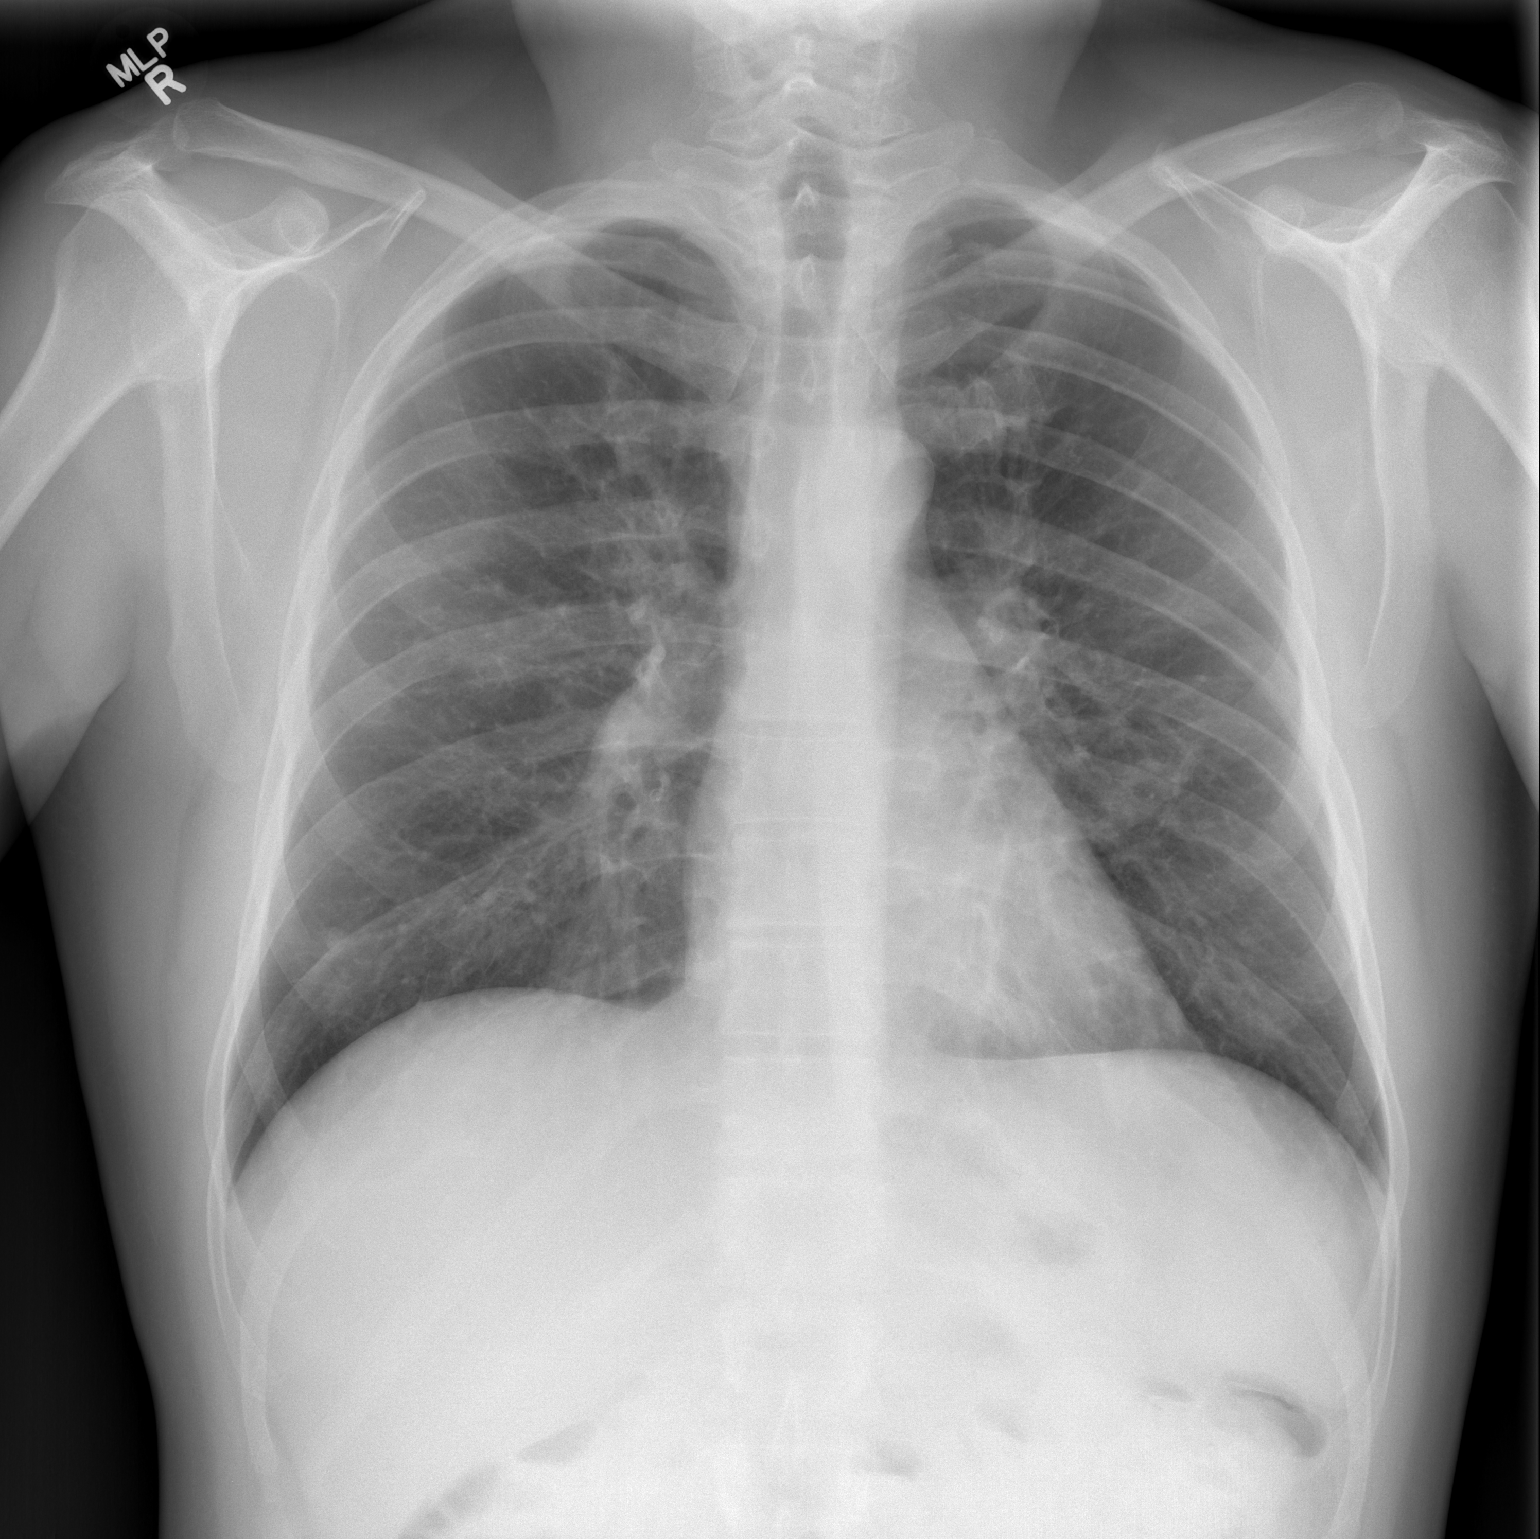

[w chest lat]
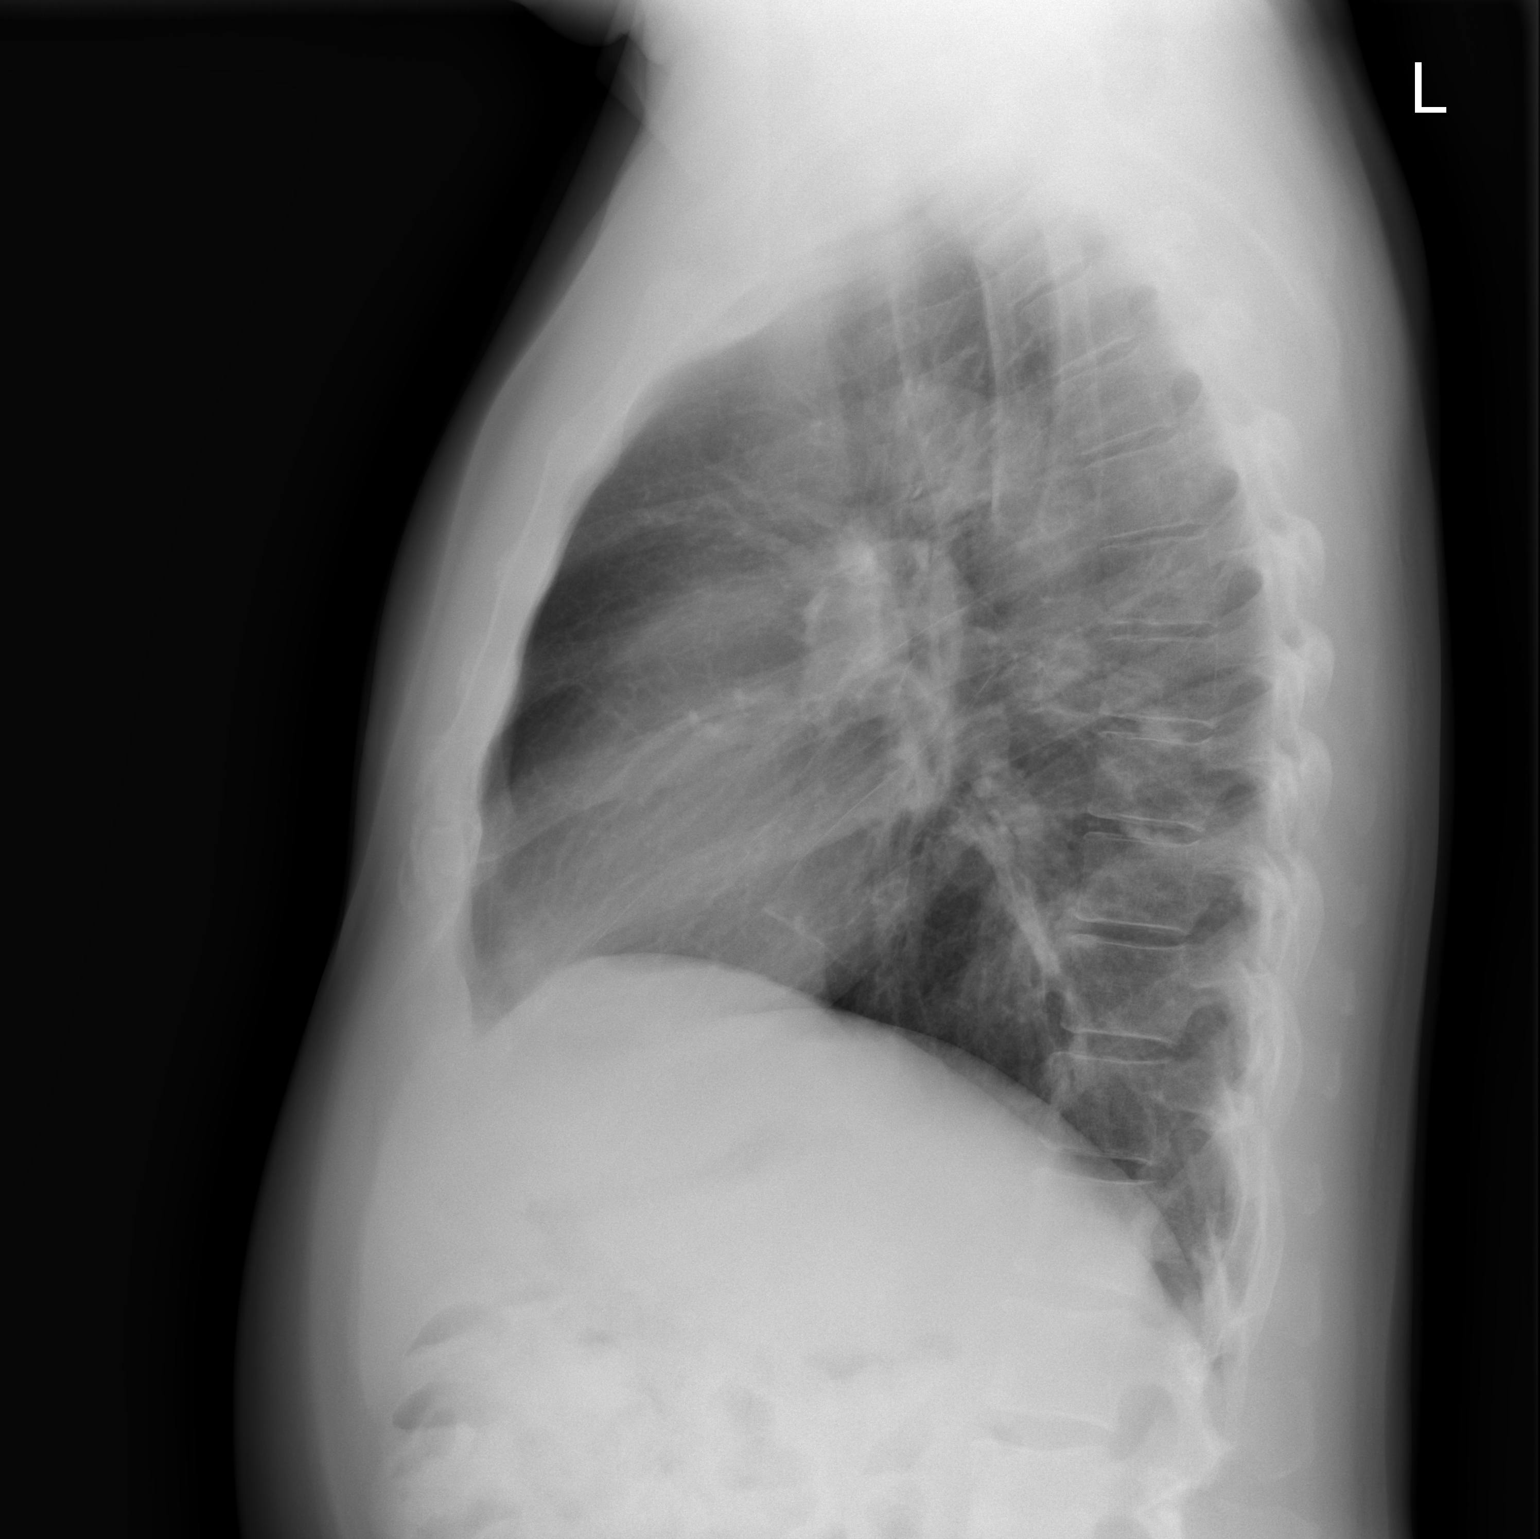

[2 of 2 positions shown; findings below may reference images not displayed]

FINDINGS: Grossly unchanged cardiac silhouette and mediastinal contours. The
previously identified bilateral lower lung and left mid lung
heterogeneous air space opacities are grossly unchanged compared to
recent prior examination.  Interval development of an additional
ill-defined airspace opacity within the right mid lung. Nodular
opacities overlying the bilateral lower lung favored to represent
nipple shadows.  No pleural effusion or pneumothorax.  Unchanged
bones.
IMPRESSION: Findings most suggestive of progressive multifocal infection.  A
follow-up chest radiograph in 4 to 6 weeks after treatment is
recommended to ensure resolution.

## 2015-11-18 ENCOUNTER — Encounter (HOSPITAL_COMMUNITY): Payer: Self-pay | Admitting: Emergency Medicine

## 2015-11-18 ENCOUNTER — Emergency Department (HOSPITAL_COMMUNITY)
Admission: EM | Admit: 2015-11-18 | Discharge: 2015-11-19 | Discharge status: Against Medical Advice | Payer: Self-pay | Attending: Emergency Medicine | Admitting: Emergency Medicine

## 2015-11-18 ENCOUNTER — Emergency Department (HOSPITAL_COMMUNITY): Payer: Self-pay

## 2015-11-18 DIAGNOSIS — Z8669 Personal history of other diseases of the nervous system and sense organs: Secondary | ICD-10-CM | POA: Insufficient documentation

## 2015-11-18 DIAGNOSIS — N50812 Left testicular pain: Secondary | ICD-10-CM | POA: Insufficient documentation

## 2015-11-18 DIAGNOSIS — Z87891 Personal history of nicotine dependence: Secondary | ICD-10-CM | POA: Insufficient documentation

## 2015-11-18 DIAGNOSIS — Z8719 Personal history of other diseases of the digestive system: Secondary | ICD-10-CM | POA: Insufficient documentation

## 2015-11-18 DIAGNOSIS — R110 Nausea: Secondary | ICD-10-CM | POA: Insufficient documentation

## 2015-11-18 DIAGNOSIS — Z79899 Other long term (current) drug therapy: Secondary | ICD-10-CM | POA: Insufficient documentation

## 2015-11-18 DIAGNOSIS — R1032 Left lower quadrant pain: Secondary | ICD-10-CM | POA: Insufficient documentation

## 2015-11-18 DIAGNOSIS — Z880 Allergy status to penicillin: Secondary | ICD-10-CM | POA: Insufficient documentation

## 2015-11-18 LAB — CBC WITH DIFFERENTIAL/PLATELET
BASOS ABS: 0 10*3/uL (ref 0.0–0.1)
Basophils Relative: 0 %
Eosinophils Absolute: 0.3 10*3/uL (ref 0.0–0.7)
Eosinophils Relative: 3 %
HEMATOCRIT: 37.9 % — AB (ref 39.0–52.0)
Hemoglobin: 13.4 g/dL (ref 13.0–17.0)
LYMPHS ABS: 2.2 10*3/uL (ref 0.7–4.0)
LYMPHS PCT: 22 %
MCH: 31.1 pg (ref 26.0–34.0)
MCHC: 35.4 g/dL (ref 30.0–36.0)
MCV: 87.9 fL (ref 78.0–100.0)
MONO ABS: 0.8 10*3/uL (ref 0.1–1.0)
Monocytes Relative: 8 %
NEUTROS ABS: 6.4 10*3/uL (ref 1.7–7.7)
Neutrophils Relative %: 67 %
Platelets: 297 10*3/uL (ref 150–400)
RBC: 4.31 MIL/uL (ref 4.22–5.81)
RDW: 13.4 % (ref 11.5–15.5)
WBC: 9.7 10*3/uL (ref 4.0–10.5)

## 2015-11-18 LAB — BASIC METABOLIC PANEL
Anion gap: 13 (ref 5–15)
BUN: 19 mg/dL (ref 6–20)
CALCIUM: 9.2 mg/dL (ref 8.9–10.3)
CHLORIDE: 108 mmol/L (ref 101–111)
CO2: 23 mmol/L (ref 22–32)
CREATININE: 1.04 mg/dL (ref 0.61–1.24)
GFR calc Af Amer: >60 mL/min (ref >60)
GFR calc non Af Amer: >60 mL/min (ref >60)
GLUCOSE: 96 mg/dL (ref 65–99)
Potassium: 4.7 mmol/L (ref 3.5–5.1)
Sodium: 144 mmol/L (ref 135–145)

## 2015-11-18 LAB — I-STAT CG4 LACTIC ACID, ED: Lactic Acid, Venous: 1.24 mmol/L (ref 0.5–2.0)

## 2015-11-18 MED ORDER — ONDANSETRON HCL 4 MG/2ML IJ SOLN
4.0000 mg | INTRAMUSCULAR | Status: AC
  Administered 2015-11-18: 4 mg via INTRAVENOUS
  Filled 2015-11-18: qty 2

## 2015-11-18 MED ORDER — KETOROLAC TROMETHAMINE 30 MG/ML IJ SOLN
30.0000 mg | Freq: Once | INTRAMUSCULAR | Status: DC
  Filled 2015-11-18: qty 1

## 2015-11-18 MED ORDER — HYDROMORPHONE HCL 1 MG/ML IJ SOLN
0.5000 mg | Freq: Once | INTRAMUSCULAR | Status: AC
  Administered 2015-11-18: 0.5 mg via INTRAVENOUS
  Filled 2015-11-18: qty 1

## 2015-11-18 MED ORDER — HYDROMORPHONE HCL 1 MG/ML IJ SOLN
1.0000 mg | Freq: Once | INTRAMUSCULAR | Status: AC
  Administered 2015-11-18: 1 mg via INTRAVENOUS
  Filled 2015-11-18: qty 1

## 2015-11-18 MED ORDER — HYDROMORPHONE HCL 1 MG/ML IJ SOLN
0.5000 mg | Freq: Once | INTRAMUSCULAR | Status: AC
  Administered 2015-11-18: 0.5 mg via INTRAVENOUS

## 2015-11-18 MED ORDER — OXYCODONE-ACETAMINOPHEN 5-325 MG PO TABS: 1.0000 | ORAL_TABLET | Freq: Once | ORAL | Status: DC

## 2015-11-18 MED ORDER — HYDROMORPHONE HCL 1 MG/ML IJ SOLN
1.0000 mg | Freq: Once | INTRAMUSCULAR | Status: AC
  Administered 2015-11-19: 1 mg via INTRAVENOUS
  Filled 2015-11-18: qty 1

## 2015-11-18 NOTE — ED Notes (Addendum)
Pt states that he started a new job 3 days ago that is labor intensive and has had L groin pain since that time. Pain is centralized in his L testicle. Alert and oriented.

## 2015-11-18 NOTE — ED Notes (Signed)
Pt asked to see provider; states he's still in pain; rates pain at 6 out of 10.

## 2015-11-18 NOTE — ED Provider Notes (Signed)
CSN: [id number]     Arrival date & time 11/18/15  2025 History   First MD Initiated Contact with Patient 11/18/15 2044     Chief Complaint  Patient presents with  . Groin Pain    HPI   Ian Ruiz is a 45 y.o. male with a PMH of UC who presents to the ED with left groin pain radiating to his left testicle, which he states was mild last night and worsened today 45 minutes prior to arrival. He states his pain is constant. He notes standing exacerbates his pain. He has not tried anything for symptom relief. He denies history of similar symptoms. He reports associated nausea. He denies fever, chills, abdominal pain, V/D/C, dysuria, urgency, frequency, penile discharge.   Past Medical History  Diagnosis Date  . Ulcerative colitis   . Meniere disease   . Substance abuse    Past Surgical History  Procedure Laterality Date  . Fracture surgery     Family History  Problem Relation Age of Onset  . COPD Father   . Heart attack Father    Social History  Substance Use Topics  . Smoking status: Former Smoker -- 0.50 packs/day for 20 years    Types: Cigarettes    Quit date: 08/14/2001  . Smokeless tobacco: Never Used  . Alcohol Use: No     Comment: 1 month substance free      Review of Systems  Constitutional: Negative for fever and chills.  Gastrointestinal: Positive for nausea. Negative for vomiting, abdominal pain, diarrhea and constipation.  Genitourinary: Positive for testicular pain. Negative for dysuria, frequency, hematuria, penile swelling, scrotal swelling and penile pain.  All other systems reviewed and are negative.     Allergies  Clonidine derivatives and Penicillins  Home Medications   Prior to Admission medications   Medication Sig Start Date End Date Taking? Authorizing Provider  clonazePAM (KLONOPIN) 2 MG tablet Take 2 mg by mouth at bedtime.   Yes Historical Provider, MD  oxyCODONE (ROXICODONE) 15 MG immediate release tablet Take 1 tablet (15 mg total) by  mouth every 3 (three) hours as needed. Patient not taking: Reported on 11/18/2015 08/15/12   Dorothea OgleIskra M Myers, MD  traMADol (ULTRAM) 50 MG tablet Take 1 tablet (50 mg total) by mouth every 6 (six) hours as needed. 11/19/15   Mady GemmaElizabeth C Estel Scholze, PA-C    BP 117/83 mmHg  Pulse 83  Temp(Src) 97.9 F (36.6 C) (Oral)  Resp 16  SpO2 97% Physical Exam  Constitutional: He is oriented to person, place, and time. He appears well-developed and well-nourished. No distress.  HENT:  Head: Normocephalic and atraumatic.  Right Ear: External ear normal.  Left Ear: External ear normal.  Nose: Nose normal.  Mouth/Throat: Uvula is midline, oropharynx is clear and moist and mucous membranes are normal.  Eyes: Conjunctivae, EOM and lids are normal. Pupils are equal, round, and reactive to light. Right eye exhibits no discharge. Left eye exhibits no discharge. No scleral icterus.  Neck: Normal range of motion. Neck supple.  Cardiovascular: Normal rate, regular rhythm, normal heart sounds, intact distal pulses and normal pulses.   Pulmonary/Chest: Effort normal and breath sounds normal. No respiratory distress. He has no wheezes. He has no rales.  Abdominal: Soft. Normal appearance and bowel sounds are normal. He exhibits no distension and no mass. There is no tenderness. There is no rigidity, no rebound and no guarding. Hernia confirmed negative in the right inguinal area and confirmed negative in the left inguinal area.  Genitourinary: Right testis shows no mass, no swelling and no tenderness. Left testis shows tenderness. Left testis shows no mass and no swelling. Circumcised. No penile erythema or penile tenderness. No discharge found.  TTP to left inguinal region and left testes.  Musculoskeletal: Normal range of motion. He exhibits no edema or tenderness.  Neurological: He is alert and oriented to person, place, and time. He has normal strength. No cranial nerve deficit or sensory deficit.  Skin: Skin is warm,  dry and intact. No rash noted. He is not diaphoretic. No erythema. No pallor.  Psychiatric: He has a normal mood and affect. His speech is normal and behavior is normal.  Nursing note and vitals reviewed.   ED Course  Procedures (including critical care time)  Labs Review Labs Reviewed  CBC WITH DIFFERENTIAL/PLATELET - Abnormal; Notable for the following:    HCT 37.9 (*)    All other components within normal limits  BASIC METABOLIC PANEL  URINALYSIS, ROUTINE W REFLEX MICROSCOPIC (NOT AT [facility])  I-STAT CG4 LACTIC ACID, ED    Imaging Review US Scrotum  11/18/2015  CLINICAL DATA:  Left testicle pain tonight. EXAM: SCROTAL ULTRASOUND DOPPLER ULTRASOUND OF THE TESTICLES TECHNIQUE: Complete ultrasound examination of the testicles, epididymis, and other scrotal structures was performed. Color and spectral Doppler ultrasound were also utilized to evaluate blood flow to the testicles. COMPARISON:  None. FINDINGS: Right testicle Measurements: 5.0 x 2.7 x 3.4 cm. No mass or microlithiasis visualized. Left testicle Measurements: With 4.4 x 2.3 x 2.9 cm. No mass or microlithiasis visualized. Right epididymis:  Normal in size and appearance. Left epididymis:  6 mm cyst/spermatocele in the epididymal head. Hydrocele:  Trace fluid adjacent to the left testicle. Varicocele: Mild dilatation of the veins of the pampiniform plexus bilaterally measuring up to 4 mm in diameter, increased with Valsalva. Pulsed Doppler interrogation of both testes demonstrates normal low resistance arterial and venous waveforms bilaterally. IMPRESSION: 1. No evidence of testicular torsion or other acute abnormality. 2. Small bilateral varicoceles. Electronically Signed   By: Sebastian Ache M.D.   On: 11/18/2015 23:02   [place] Art/ven Flow Abd Pelv Doppler  11/18/2015  CLINICAL DATA:  Left testicle pain tonight. EXAM: SCROTAL ULTRASOUND DOPPLER ULTRASOUND OF THE TESTICLES TECHNIQUE: Complete ultrasound examination of the testicles,  epididymis, and other scrotal structures was performed. Color and spectral Doppler ultrasound were also utilized to evaluate blood flow to the testicles. COMPARISON:  None. FINDINGS: Right testicle Measurements: 5.0 x 2.7 x 3.4 cm. No mass or microlithiasis visualized. Left testicle Measurements: With 4.4 x 2.3 x 2.9 cm. No mass or microlithiasis visualized. Right epididymis:  Normal in size and appearance. Left epididymis:  6 mm cyst/spermatocele in the epididymal head. Hydrocele:  Trace fluid adjacent to the left testicle. Varicocele: Mild dilatation of the veins of the pampiniform plexus bilaterally measuring up to 4 mm in diameter, increased with Valsalva. Pulsed Doppler interrogation of both testes demonstrates normal low resistance arterial and venous waveforms bilaterally. IMPRESSION: 1. No evidence of testicular torsion or other acute abnormality. 2. Small bilateral varicoceles. Electronically Signed   By: Sebastian Ache M.D.   On: 11/18/2015 23:02   I have personally reviewed and evaluated these images and lab results as part of my medical decision-making.   EKG Interpretation None      MDM   Final diagnoses:  Left groin pain    45 year old male presents with left-sided groin pain radiating to his left testicle. States his symptoms started last night and worsened  45 minutes prior to arrival. Reports associated nausea. Denies abdominal pain, vomiting, diarrhea, constipation. Patient is afebrile. Vital signs stable. Abdomen soft, nontender, nondistended. On exam, patient has tenderness to palpation to his left inguinal region with no palpable hernia and TTP to his left testicle. Will obtain labs, scrotal US, UA. Patient given pain medication.   CBC negative for leukocytosis or anemia. BMP unremarkable. Lactic acid within normal limits. Ultrasound negative for testicular torsion or other acute abnormality. Patient refusing to wait for UA to result. He is requesting to be discharged. Patient is  nontoxic and well-appearing and reports symptom improvement after pain medication administration the ED. Will discharge AMA. Return precautions discussed. Patient to follow up with PCP. Patient verbalizes his understanding and is in agreement with plan.  BP 117/83 mmHg  Pulse 83  Temp(Src) 97.9 F (36.6 C) (Oral)  Resp 16  SpO2 97%     Mady Gemma, PA-C 11/19/15 0055  Arby Barrette, MD 11/24/15 [phone]

## 2015-11-19 LAB — URINALYSIS, ROUTINE W REFLEX MICROSCOPIC
BILIRUBIN URINE: NEGATIVE
Glucose, UA: NEGATIVE mg/dL
Hgb urine dipstick: NEGATIVE
Ketones, ur: NEGATIVE mg/dL
Leukocytes, UA: NEGATIVE
NITRITE: NEGATIVE
Protein, ur: NEGATIVE mg/dL
SPECIFIC GRAVITY, URINE: 1.025 (ref 1.005–1.030)
pH: 6.5 (ref 5.0–8.0)

## 2015-11-19 MED ORDER — TRAMADOL HCL 50 MG PO TABS: 50.0000 mg | ORAL_TABLET | Freq: Four times a day (QID) | ORAL | Status: DC | PRN

## 2015-11-19 NOTE — Discharge Instructions (Signed)
1. Medications: tramadol for pain, usual home medications 2. Treatment: rest, drink plenty of fluids 3. Follow Up: please followup with your primary doctor for discussion of your diagnoses and further evaluation after today's visit; if you do not have a primary care doctor use the phone number listed in your discharge paperwork to find one; please return to the ER for high fever, severe pain, new or worsening symptoms   Flank Pain Flank pain is pain in your side. The flank is the area of your side between your upper belly (abdomen) and your back. Pain in this area can be caused by many different things. HOME CARE Home care and treatment will depend on the cause of your pain.  Rest as told by your doctor.  Drink enough fluids to keep your pee (urine) clear or pale yellow.  Only take medicine as told by your doctor.  Tell your doctor about any changes in your pain.  Follow up with your doctor. GET HELP RIGHT AWAY IF:   Your pain does not get better with medicine.   You have new symptoms or your symptoms get worse.  Your pain gets worse.   You have belly (abdominal) pain.   You are short of breath.   You always feel sick to your stomach (nauseous).   You keep throwing up (vomiting).   You have puffiness (swelling) in your belly.   You feel light-headed or you pass out (faint).   You have blood in your pee.  You have a fever or lasting symptoms for more than 2-3 days.  You have a fever and your symptoms suddenly get worse. MAKE SURE YOU:   Understand these instructions.  Will watch your condition.  Will get help right away if you are not doing well or get worse.   This information is not intended to replace advice given to you by your health care provider. Make sure you discuss any questions you have with your health care provider.   Document Released: 05/24/2008 Document Revised: 09/05/2014 Document Reviewed: 03/29/2012 Elsevier Interactive Patient  Education [employer]2016 Elsevier Inc.

## 2015-11-19 NOTE — ED Notes (Signed)
Pt used call bell tell make staff aware he is ready to go home

## 2015-11-22 ENCOUNTER — Emergency Department (HOSPITAL_COMMUNITY)
Admission: EM | Admit: 2015-11-22 | Discharge: 2015-11-22 | Disposition: A | Discharge status: Home / Self Care | Payer: Self-pay | Attending: Emergency Medicine | Admitting: Emergency Medicine

## 2015-11-22 ENCOUNTER — Emergency Department (HOSPITAL_COMMUNITY): Payer: Self-pay

## 2015-11-22 ENCOUNTER — Encounter (HOSPITAL_COMMUNITY): Payer: Self-pay | Admitting: Emergency Medicine

## 2015-11-22 DIAGNOSIS — Z8669 Personal history of other diseases of the nervous system and sense organs: Secondary | ICD-10-CM | POA: Insufficient documentation

## 2015-11-22 DIAGNOSIS — Z87891 Personal history of nicotine dependence: Secondary | ICD-10-CM | POA: Insufficient documentation

## 2015-11-22 DIAGNOSIS — I861 Scrotal varices: Secondary | ICD-10-CM | POA: Insufficient documentation

## 2015-11-22 DIAGNOSIS — Z880 Allergy status to penicillin: Secondary | ICD-10-CM | POA: Insufficient documentation

## 2015-11-22 DIAGNOSIS — Z8719 Personal history of other diseases of the digestive system: Secondary | ICD-10-CM | POA: Insufficient documentation

## 2015-11-22 DIAGNOSIS — Z79899 Other long term (current) drug therapy: Secondary | ICD-10-CM | POA: Insufficient documentation

## 2015-11-22 DIAGNOSIS — N5082 Scrotal pain: Secondary | ICD-10-CM

## 2015-11-22 DIAGNOSIS — R1030 Lower abdominal pain, unspecified: Secondary | ICD-10-CM | POA: Insufficient documentation

## 2015-11-22 LAB — URINALYSIS, ROUTINE W REFLEX MICROSCOPIC
BILIRUBIN URINE: NEGATIVE
GLUCOSE, UA: NEGATIVE mg/dL
Hgb urine dipstick: NEGATIVE
Ketones, ur: NEGATIVE mg/dL
Leukocytes, UA: NEGATIVE
NITRITE: NEGATIVE
PH: 7 (ref 5.0–8.0)
Protein, ur: NEGATIVE mg/dL
SPECIFIC GRAVITY, URINE: 1.022 (ref 1.005–1.030)

## 2015-11-22 MED ORDER — ACETAMINOPHEN 500 MG PO TABS
1000.0000 mg | ORAL_TABLET | Freq: Once | ORAL | Status: DC
  Filled 2015-11-22: qty 2

## 2015-11-22 MED ORDER — HYDROCODONE-ACETAMINOPHEN 5-325 MG PO TABS
1.0000 | ORAL_TABLET | Freq: Once | ORAL | Status: AC
  Administered 2015-11-22: 1 via ORAL
  Filled 2015-11-22: qty 1

## 2015-11-22 MED ORDER — KETOROLAC TROMETHAMINE 60 MG/2ML IM SOLN
60.0000 mg | Freq: Once | INTRAMUSCULAR | Status: AC
  Administered 2015-11-22: 60 mg via INTRAMUSCULAR
  Filled 2015-11-22: qty 2

## 2015-11-22 NOTE — Discharge Instructions (Signed)
Varicocele  A varicocele is a swelling of veins in the scrotum. The scrotum is the sac that contains the testicles. Varicoceles can occur on either side of the scrotum, but they are more common on the left side. They occur most often in teenage boys and young men.  In most cases, varicoceles are not a serious problem. They are usually small and painless and do not require treatment. Tests may be done to confirm the diagnosis. Treatment may be needed if:  · A varicocele is large, causes a lot of pain, or causes pain when exercising.  · Varicoceles are found on both sides of the scrotum.  · The testicle on the opposite side is absent or not normal.  · A varicocele causes a decrease in the size of the testicle in a growing adolescent.  · The person has fertility problems.  CAUSES  This condition is the result of valves in the veins not working properly. Valves in the veins help to return blood from the scrotum and testicles to the heart. If these valves do not work well, blood flows backward and backs up into the veins, which causes the veins to swell. This is similar to what happens when varicose veins form in the leg.  SYMPTOMS  Most varicoceles do not cause any symptoms. If symptoms do occur, they may include:  · Swelling on one side of the scrotum. The swelling may be more obvious when you are standing up.  · A lumpy feeling in the scrotum.  · A heavy feeling on one side of the scrotum.  · A dull ache in the scrotum, especially after exercise or prolonged standing or sitting.  · Slower growth or reduced size of the testicle on the side of the varicocele (in young males).  · Problems with fertility. These can occur if the testicle does not grow normally.  DIAGNOSIS  This condition may be diagnosed with a physical exam. You may also have an imaging test, called an ultrasound, to confirm the diagnosis and to help rule out other causes of the swelling.  TREATMENT  Treatment is usually not needed for this condition. If  you have any pain, your health care provider may prescribe or recommend medicine to help relieve it. You may need regular exams so your health care provider can monitor the varicocele to ensure that it does not cause problems. When further treatment is needed, it may involve one of these options:  · Varicocelectomy. This is a surgery in which the swollen veins are tied off so that the flow of blood goes to other veins instead.  · Embolization. In this procedure, a small tube (catheter) is used to place metal coils or other blocking items in the veins. This cuts off the blood flow to the swollen veins.  HOME CARE INSTRUCTIONS  · Take medicines only as directed by your health care provider.  · Wear supportive underwear.  · Use an athletic supporter for sports.  · Keep all follow-up visits as directed by your health care provider. This is important.  SEEK MEDICAL CARE IF:  · Your pain is increasing.  · You have redness in the affected area.  · You have swelling that does not decrease when you are lying down.  · One of your testicles is smaller than the other.  · Your testicle becomes enlarged, swollen, or painful.     This information is not intended to replace advice given to you by your health care provider. Make sure   you discuss any questions you have with your health care provider.     Document Released: 11/21/2000 Document Revised: 12/30/2014 Document Reviewed: 07/23/2014  Elsevier Interactive Patient Education ©2016 Elsevier Inc.

## 2015-11-22 NOTE — ED Notes (Signed)
Pt in CT.

## 2015-11-22 NOTE — ED Notes (Signed)
Explained twice that there was a delay due to critical patients and would get pain medicine as soon as possible. RN made aware

## 2015-11-22 NOTE — ED Notes (Signed)
Pt reports ongoing L groin pain for the last several days. Was here on 3/22 for same. Pain flared up this am when walking up some stairs.

## 2015-11-22 NOTE — ED Notes (Signed)
Bed: WA14 Expected date:  Expected time:  Means of arrival:  Comments: EMS 

## 2015-11-22 NOTE — ED Provider Notes (Signed)
CSN: [id number]     Arrival date & time 11/22/15  1116 History   First MD Initiated Contact with Patient 11/22/15 1136     Chief Complaint  Patient presents with  . Groin Pain     (Consider location/radiation/quality/duration/timing/severity/associated sxs/prior Treatment) Patient is a 45 y.o. male presenting with testicular pain. The history is provided by the patient.  Testicle Pain This is a recurrent problem. The current episode started more than 2 days ago. The problem occurs daily. The problem has been gradually worsening. Pertinent negatives include no abdominal pain and no shortness of breath. Nothing aggravates the symptoms. Nothing relieves the symptoms. He has tried nothing for the symptoms.    Past Medical History  Diagnosis Date  . Ulcerative colitis   . Meniere disease   . Substance abuse    Past Surgical History  Procedure Laterality Date  . Fracture surgery     Family History  Problem Relation Age of Onset  . COPD Father   . Heart attack Father    Social History  Substance Use Topics  . Smoking status: Former Smoker -- 0.50 packs/day for 20 years    Types: Cigarettes    Quit date: 08/14/2001  . Smokeless tobacco: Never Used  . Alcohol Use: No     Comment: 1 month substance free    Review of Systems  Respiratory: Negative for shortness of breath.   Gastrointestinal: Negative for abdominal pain.  Genitourinary: Positive for testicular pain.  All other systems reviewed and are negative.     Allergies  Clonidine derivatives and Penicillins  Home Medications   Prior to Admission medications   Medication Sig Start Date End Date Taking? Authorizing Provider  clonazePAM (KLONOPIN) 2 MG tablet Take 2 mg by mouth at bedtime.    Historical Provider, MD  oxyCODONE (ROXICODONE) 15 MG immediate release tablet Take 1 tablet (15 mg total) by mouth every 3 (three) hours as needed. Patient not taking: Reported on 11/18/2015 08/15/12   Dorothea Ogle, MD   traMADol (ULTRAM) 50 MG tablet Take 1 tablet (50 mg total) by mouth every 6 (six) hours as needed. 11/19/15   Dorise Hiss Westfall, PA-C   BP 122/96 mmHg  Pulse 72  Temp(Src) 98.1 F (36.7 C) (Oral)  Resp 16  SpO2 99% Physical Exam  Constitutional: He is oriented to person, place, and time. He appears well-developed and well-nourished. No distress.  HENT:  Head: Normocephalic and atraumatic.  Eyes: Conjunctivae are normal.  Neck: Neck supple. No tracheal deviation present.  Cardiovascular: Normal rate and regular rhythm.   Pulmonary/Chest: Effort normal. No respiratory distress.  Abdominal: Soft. He exhibits no distension. Hernia confirmed negative in the right inguinal area and confirmed negative in the left inguinal area.  Genitourinary: Rectum normal, prostate normal, testes normal and penis normal. Prostate is not enlarged and not tender. Cremasteric reflex is present. Right testis shows no swelling and no tenderness. Left testis shows no swelling and no tenderness.  Tenderness at bilateral inguinal canal worse on left  Lymphadenopathy:       Right: No inguinal adenopathy present.       Left: No inguinal adenopathy present.  Neurological: He is alert and oriented to person, place, and time.  Skin: Skin is warm and dry.  Psychiatric: He has a normal mood and affect.    ED Course  Procedures (including critical care time)  Labs Review Labs Reviewed  URINE CULTURE  URINALYSIS, ROUTINE W REFLEX MICROSCOPIC (NOT AT [facility])  GC/CHLAMYDIA  PROBE AMP ([place]) NOT AT [facility]    Imaging Review Ct Renal Stone Study  11/22/2015  CLINICAL DATA:  Ongoing left-sided groin pain for several days. EXAM: CT ABDOMEN AND PELVIS WITHOUT CONTRAST TECHNIQUE: Multidetector CT imaging of the abdomen and pelvis was performed following the standard protocol without IV contrast. COMPARISON:  CT abdomen pelvis - 08/12/2012; 09/30/2011; testicular ultrasound - 11/18/2015 FINDINGS: The lack of intravenous  contrast limits the ability to evaluate solid abdominal organs. Normal noncontrast appearance of the bilateral kidneys. No renal stones. No renal stones are seen along the expected course of either ureter or the urinary bladder. Normal appearance of the urinary bladder given degree distention. No urinary obstruction or perinephric stranding. Normal hepatic contour. Normal appearance of the gallbladder given underdistention. No ascites. Normal noncontrast appearance of the adrenal glands, pancreas and spleen. Incidental note is made of a small splenule. Moderate colonic stool burden without evidence of enteric obstruction. Scatter minimal colonic diverticulosis without evidence of diverticulitis. Normal noncontrast appearance of the terminal ileum and retrocecal appendix. No pneumoperitoneum, pneumatosis or portal venous gas. Scattered minimal amount of atherosclerotic plaque within a normal caliber abdominal aorta. No bulky retroperitoneal, mesenteric, pelvic or inguinal lymphadenopathy. Normal noncontrast appearance of the prostate gland. No free fluid in the pelvic cul-de-sac. Limited visualization of the lower thorax demonstrates a punctate (approximately 0.8 cm) granuloma as old and adjacent punctate (approximately 4 mm) nodule within the imaged right lower lobe (both nodules seen on image 6, series 6), both of which are unchanged since the 08/12/2014 examination. Unchanged minimal subsegmental atelectasis/scar within in the basilar aspect of the right lower lobe. No focal airspace opacities. No pleural effusion. Normal heart size.  No pericardial effusion. No acute or aggressive osseous abnormalities. Stigmata of DISH within the thoracic spine. Mild diffuse body wall anasarca, most conspicuous about the midline of the low back. Regional soft tissues appear otherwise normal. Specifically, no evidence of inguinal hernia. IMPRESSION: 1. No explanation for patient's ongoing left groin pain. Specifically, no  evidence of nephrolithiasis, urinary or enteric obstruction. No evidence of inguinal hernia. 2. Colonic diverticulosis without evidence of diverticulitis. Electronically Signed   By: Simonne Come M.D.   On: 11/22/2015 14:20   I have personally reviewed and evaluated these images and lab results as part of my medical decision-making.   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 male presents with second visit for similar groin pain. Notable left worse than right, onset worse while walking up stairs today. No abnormality on previous US except mild bilateral varicocele. Pt repeatedly asking for pain medication and bargaining. Considered atypical stone presentation, CT negative. UA negative. No evidence of inguinal hernia. Presentation is atypical for torsion and there is evidence of appropriate flow on bedside US. Tenderness is focally over the lower inguinal canal especially on left. Unclear what is causing this currently. Pt given single dose of norco here but I explained I was uncomfortable prescribing ongoing narcotic pain medication without a clear diagnosis and Pt exhibiting high risk features for drug seeking behavior. I recommended f/u in urology clinic to explore other causes of this focal groin pain as I am unable to explain it from the ED and think it is unlikely to be caused by the only objective finding of varicocele. Recommended treatment with NSAIDs and ice over area for symptomatic relief in the interim. Plan to follow up  with PCP as needed and return precautions discussed for worsening or new concerning symptoms.     Lyndal Pulleyaniel Jamesyn Lindell, MD 11/24/15 1037

## 2015-11-23 LAB — URINE CULTURE: Culture: NO GROWTH

## 2015-11-23 LAB — GC/CHLAMYDIA PROBE AMP (~~LOC~~) NOT AT ARMC
CHLAMYDIA, DNA PROBE: NEGATIVE
Neisseria Gonorrhea: NEGATIVE

## 2015-11-24 ENCOUNTER — Emergency Department (HOSPITAL_COMMUNITY)
Admission: EM | Admit: 2015-11-24 | Discharge: 2015-11-24 | Disposition: A | Discharge status: Home / Self Care | Payer: Self-pay | Attending: Emergency Medicine | Admitting: Emergency Medicine

## 2015-11-24 ENCOUNTER — Emergency Department (HOSPITAL_COMMUNITY): Payer: Self-pay

## 2015-11-24 ENCOUNTER — Encounter (HOSPITAL_COMMUNITY): Payer: Self-pay | Admitting: Family Medicine

## 2015-11-24 DIAGNOSIS — Z87891 Personal history of nicotine dependence: Secondary | ICD-10-CM | POA: Insufficient documentation

## 2015-11-24 DIAGNOSIS — Z8669 Personal history of other diseases of the nervous system and sense organs: Secondary | ICD-10-CM | POA: Insufficient documentation

## 2015-11-24 DIAGNOSIS — J069 Acute upper respiratory infection, unspecified: Secondary | ICD-10-CM | POA: Insufficient documentation

## 2015-11-24 DIAGNOSIS — Z880 Allergy status to penicillin: Secondary | ICD-10-CM | POA: Insufficient documentation

## 2015-11-24 MED ORDER — BENZONATATE 100 MG PO CAPS: 100.0000 mg | ORAL_CAPSULE | Freq: Three times a day (TID) | ORAL | Status: DC

## 2015-11-24 NOTE — ED Notes (Signed)
Cough, body aches, sore throat x2 days

## 2015-11-24 NOTE — Discharge Instructions (Signed)
Upper Respiratory Infection, Adult Most upper respiratory infections (URIs) are a viral infection of the air passages leading to the lungs. A URI affects the nose, throat, and upper air passages. The most common type of URI is nasopharyngitis and is typically referred to as "the common cold." URIs run their course and usually go away on their own. Most of the time, a URI does not require medical attention, but sometimes a bacterial infection in the upper airways can follow a viral infection. This is called a secondary infection. Sinus and middle ear infections are common types of secondary upper respiratory infections. Bacterial pneumonia can also complicate a URI. A URI can worsen asthma and chronic obstructive pulmonary disease (COPD). Sometimes, these complications can require emergency medical care and may be life threatening.  CAUSES Almost all URIs are caused by viruses. A virus is a type of germ and can spread from one person to another.  RISKS FACTORS You may be at risk for a URI if:   You smoke.   You have chronic heart or lung disease.  You have a weakened defense (immune) system.   You are very young or very old.   You have nasal allergies or asthma.  You work in crowded or poorly ventilated areas.  You work in health care facilities or schools. SIGNS AND SYMPTOMS  Symptoms typically develop 2-3 days after you come in contact with a cold virus. Most viral URIs last 7-10 days. However, viral URIs from the influenza virus (flu virus) can last 14-18 days and are typically more severe. Symptoms may include:   Runny or stuffy (congested) nose.   Sneezing.   Cough.   Sore throat.   Headache.   Fatigue.   Fever.   Loss of appetite.   Pain in your forehead, behind your eyes, and over your cheekbones (sinus pain).  Muscle aches.  DIAGNOSIS  Your health care provider may diagnose a URI by:  Physical exam.  Tests to check that your symptoms are not due to  another condition such as:  Strep throat.  Sinusitis.  Pneumonia.  Asthma. TREATMENT  A URI goes away on its own with time. It cannot be cured with medicines, but medicines may be prescribed or recommended to relieve symptoms. Medicines may help:  Reduce your fever.  Reduce your cough.  Relieve nasal congestion. HOME CARE INSTRUCTIONS   Take medicines only as directed by your health care provider.   Gargle warm saltwater or take cough drops to comfort your throat as directed by your health care provider.  Use a warm mist humidifier or inhale steam from a shower to increase air moisture. This may make it easier to breathe.  Drink enough fluid to keep your urine clear or pale yellow.   Eat soups and other clear broths and maintain good nutrition.   Rest as needed.   Return to work when your temperature has returned to normal or as your health care provider advises. You may need to stay home longer to avoid infecting others. You can also use a face mask and careful hand washing to prevent spread of the virus.  Increase the usage of your inhaler if you have asthma.   Do not use any tobacco products, including cigarettes, chewing tobacco, or electronic cigarettes. If you need help quitting, ask your health care provider. PREVENTION  The best way to protect yourself from getting a cold is to practice good hygiene.   Avoid oral or hand contact with people with cold   symptoms.   Wash your hands often if contact occurs.  There is no clear evidence that vitamin C, vitamin E, echinacea, or exercise reduces the chance of developing a cold. However, it is always recommended to get plenty of rest, exercise, and practice good nutrition.  SEEK MEDICAL CARE IF:   You are getting worse rather than better.   Your symptoms are not controlled by medicine.   You have chills.  You have worsening shortness of breath.  You have brown or red mucus.  You have yellow or brown nasal  discharge.  You have pain in your face, especially when you bend forward.  You have a fever.  You have swollen neck glands.  You have pain while swallowing.  You have white areas in the back of your throat. SEEK IMMEDIATE MEDICAL CARE IF:   You have severe or persistent:  Headache.  Ear pain.  Sinus pain.  Chest pain.  You have chronic lung disease and any of the following:  Wheezing.  Prolonged cough.  Coughing up blood.  A change in your usual mucus.  You have a stiff neck.  You have changes in your:  Vision.  Hearing.  Thinking.  Mood. MAKE SURE YOU:   Understand these instructions.  Will watch your condition.  Will get help right away if you are not doing well or get worse.   This information is not intended to replace advice given to you by your health care provider. Make sure you discuss any questions you have with your health care provider.   Document Released: 02/08/2001 Document Revised: 12/30/2014 Document Reviewed: 11/20/2013 Elsevier Interactive Patient Education 2016 Else[employer] Inc[employer]  

## 2015-11-24 NOTE — ED Notes (Signed)
Pt given OJ 

## 2015-11-24 NOTE — ED Notes (Signed)
Pt here for productive cough. sts fever 102 and took ibuprofen.

## 2015-11-24 NOTE — ED Notes (Signed)
Pt called out "Aren't ya'll going to get some blood work.Ian Ruiz.Ian Ruiz.How do you know this is not bacterial".

## 2015-11-24 NOTE — ED Provider Notes (Signed)
History  By signing my name below, I, Karle PlumberJennifer Tensley, attest that this documentation has been prepared under the direction and in the presence of Langston MaskerKaren Laportia Carley, [place]PA-C. Electronically Signed: Karle PlumberJennifer Tensley, ED Scribe. 11/24/2015. 1:33 PM.  Chief Complaint  Patient presents with  . Cough   The history is provided by the patient and medical records. No language interpreter was used.    HPI Comments:  Ian Ruiz is a 45 y.o. male who presents to the Emergency Department complaining of productive cough of green mucous that began three days ago. He reports associated generalized body aches,vomiting and sore throat. He reports having sick contacts with someone diagnosed with pneumonia about 10 days ago. He has not taken anything for the symptoms. He denies modifying factors. He denies rash. Pt had his flu vaccination this year.  Past Medical History  Diagnosis Date  . Ulcerative colitis   . Meniere disease   . Substance abuse    Past Surgical History  Procedure Laterality Date  . Fracture surgery     Family History  Problem Relation Age of Onset  . COPD Father   . Heart attack Father    Social History  Substance Use Topics  . Smoking status: Former Smoker -- 0.50 packs/day for 20 years    Types: Cigarettes    Quit date: 08/14/2001  . Smokeless tobacco: Never Used  . Alcohol Use: No     Comment: 1 month substance free    Review of Systems  Constitutional: Positive for fever.  Respiratory: Positive for cough.   All other systems reviewed and are negative.   Allergies  Clonidine derivatives and Penicillins  Home Medications   Prior to Admission medications   Medication Sig Start Date End Date Taking? Authorizing Provider  clonazePAM (KLONOPIN) 2 MG tablet Take 2 mg by mouth at bedtime as needed for anxiety.     Historical Provider, MD  oxyCODONE (ROXICODONE) 15 MG immediate release tablet Take 1 tablet (15 mg total) by mouth every 3 (three) hours as needed. Patient  not taking: Reported on 11/18/2015 08/15/12   Dorothea OgleIskra M Myers, MD  traMADol (ULTRAM) 50 MG tablet Take 1 tablet (50 mg total) by mouth every 6 (six) hours as needed. Patient not taking: Reported on 11/22/2015 11/19/15   Mady GemmaElizabeth C Westfall, PA-C   Triage Vitals: BP 116/63 mmHg  Pulse 102  Temp(Src) 97.9 F (36.6 C) (Oral)  Resp 18  SpO2 98% Physical Exam  Constitutional: He is oriented to person, place, and time. He appears well-developed and well-nourished.  HENT:  Head: Normocephalic and atraumatic.  Eyes: EOM are normal.  Neck: Normal range of motion.  Cardiovascular: Normal rate, regular rhythm and normal heart sounds.  Exam reveals no gallop and no friction rub.   No murmur heard. Pulmonary/Chest: Effort normal and breath sounds normal. No respiratory distress. He has no wheezes. He has no rales.  Musculoskeletal: Normal range of motion.  Neurological: He is alert and oriented to person, place, and time.  Skin: Skin is warm and dry.  Psychiatric: He has a normal mood and affect. His behavior is normal.  Nursing note and vitals reviewed.   ED Course  Procedures (including critical care time) DIAGNOSTIC STUDIES: Oxygen Saturation is 98% on RA, normal by my interpretation.   COORDINATION OF CARE: 1:31 PM- Informed pt that symptoms are likely viral in nature. Will prescribe cough medication. Will provide work note. Pt verbalizes understanding and agrees to plan.  Medications - No data to display  Labs Review Labs Reviewed - No data to display  Imaging Review Dg Chest 2 View  11/24/2015  CLINICAL DATA:  Flu-like symptoms, cough, wheezing EXAM: CHEST  2 VIEW COMPARISON:  08/12/2012 FINDINGS: Cardiomediastinal silhouette is stable. No acute infiltrate or pleural effusion. No pulmonary edema. Stable calcified granuloma in right lower lobe. Bony thorax is unremarkable. IMPRESSION: No active cardiopulmonary disease. Electronically Signed   By: Natasha Mead M.D.   On: 11/24/2015 12:48    Ct Renal Stone Study  11/22/2015  CLINICAL DATA:  Ongoing left-sided groin pain for several days. EXAM: CT ABDOMEN AND PELVIS WITHOUT CONTRAST TECHNIQUE: Multidetector CT imaging of the abdomen and pelvis was performed following the standard protocol without IV contrast. COMPARISON:  CT abdomen pelvis - 08/12/2012; 09/30/2011; testicular ultrasound - 11/18/2015 FINDINGS: The lack of intravenous contrast limits the ability to evaluate solid abdominal organs. Normal noncontrast appearance of the bilateral kidneys. No renal stones. No renal stones are seen along the expected course of either ureter or the urinary bladder. Normal appearance of the urinary bladder given degree distention. No urinary obstruction or perinephric stranding. Normal hepatic contour. Normal appearance of the gallbladder given underdistention. No ascites. Normal noncontrast appearance of the adrenal glands, pancreas and spleen. Incidental note is made of a small splenule. Moderate colonic stool burden without evidence of enteric obstruction. Scatter minimal colonic diverticulosis without evidence of diverticulitis. Normal noncontrast appearance of the terminal ileum and retrocecal appendix. No pneumoperitoneum, pneumatosis or portal venous gas. Scattered minimal amount of atherosclerotic plaque within a normal caliber abdominal aorta. No bulky retroperitoneal, mesenteric, pelvic or inguinal lymphadenopathy. Normal noncontrast appearance of the prostate gland. No free fluid in the pelvic cul-de-sac. Limited visualization of the lower thorax demonstrates a punctate (approximately 0.8 cm) granuloma as old and adjacent punctate (approximately 4 mm) nodule within the imaged right lower lobe (both nodules seen on image 6, series 6), both of which are unchanged since the 08/12/2014 examination. Unchanged minimal subsegmental atelectasis/scar within in the basilar aspect of the right lower lobe. No focal airspace opacities. No pleural effusion.  Normal heart size.  No pericardial effusion. No acute or aggressive osseous abnormalities. Stigmata of DISH within the thoracic spine. Mild diffuse body wall anasarca, most conspicuous about the midline of the low back. Regional soft tissues appear otherwise normal. Specifically, no evidence of inguinal hernia. IMPRESSION: 1. No explanation for patient's ongoing left groin pain. Specifically, no evidence of nephrolithiasis, urinary or enteric obstruction. No evidence of inguinal hernia. 2. Colonic diverticulosis without evidence of diverticulitis. Electronically Signed   By: Simonne Come M.D.   On: 11/22/2015 14:20   I have personally reviewed and evaluated these images and lab results as part of my medical decision-making.   EKG Interpretation None      MDM   Final diagnoses:  URI (upper respiratory infection)    Meds ordered this encounter  Medications  . benzonatate (TESSALON) 100 MG capsule    Sig: Take 1 capsule (100 mg total) by mouth every 8 (eight) hours.    Dispense:  21 capsule    Refill:  0    Order Specific Question:  Supervising Provider    Answer:  Eber Hong [[id number]]   An After Visit Summary was printed and given to the patient.  Lonia Skinner Caro, PA-C 11/24/15 1649  Alvira Monday, MD 11/24/15 2320

## 2015-11-26 DIAGNOSIS — Z5900 Homelessness unspecified: Secondary | ICD-10-CM

## 2015-11-26 NOTE — Congregational Nurse Program (Signed)
Congregational Nurse Program Note  Date of Encounter: 11/26/2015  Past Medical History: Past Medical History  Diagnosis Date  . Ulcerative colitis   . Meniere disease   . Substance abuse     Encounter Details:     CNP Questionnaire - 11/26/15 1358    Patient Demographics   Is this a new or existing patient? Existing   Race Other   Patient Assistance   Location of Patient Assistance Not Applicable   Patient's financial/insurance status Low Income;Self-Pay   Uninsured Patient Yes   Interventions Counseled to make appt. with provider   Patient referred to apply for the following financial assistance [employer]   Food insecurities addressed Provided food supplies   Transportation assistance No   Assistance securing medications No   Educational health offerings Chronic disease   Encounter Details   Primary purpose of visit Chronic Illness/Condition Visit;Education/Health Concerns   Was an Emergency Department visit averted? Not Applicable   Does patient have a medical provider? No   Patient referred to Clinic   Was a mental health screening completed? (GAINS tool) No   Does patient have dental issues? No   Does patient have vision issues? No   Since previous encounter, have you referred patient for abnormal blood pressure that resulted in a new diagnosis or medication change? No   Since previous encounter, have you referred patient for abnormal blood glucose that resulted in a new diagnosis or medication change? No   For Abstraction Use Only   Does patient have insurance? No       B/P check

## 2016-02-10 ENCOUNTER — Emergency Department (HOSPITAL_COMMUNITY)
Admission: EM | Admit: 2016-02-10 | Discharge: 2016-02-11 | Disposition: A | Discharge status: Home / Self Care | Payer: Self-pay | Attending: Emergency Medicine | Admitting: Emergency Medicine

## 2016-02-10 ENCOUNTER — Encounter (HOSPITAL_COMMUNITY): Payer: Self-pay | Admitting: *Deleted

## 2016-02-10 DIAGNOSIS — Z87891 Personal history of nicotine dependence: Secondary | ICD-10-CM | POA: Insufficient documentation

## 2016-02-10 DIAGNOSIS — F1414 Cocaine abuse with cocaine-induced mood disorder: Secondary | ICD-10-CM | POA: Insufficient documentation

## 2016-02-10 DIAGNOSIS — F1410 Cocaine abuse, uncomplicated: Secondary | ICD-10-CM | POA: Diagnosis present

## 2016-02-10 DIAGNOSIS — F1494 Cocaine use, unspecified with cocaine-induced mood disorder: Secondary | ICD-10-CM | POA: Diagnosis present

## 2016-02-10 MED ORDER — NICOTINE 21 MG/24HR TD PT24: 21.0000 mg | MEDICATED_PATCH | Freq: Every day | TRANSDERMAL | Status: DC

## 2016-02-10 MED ORDER — ONDANSETRON HCL 4 MG PO TABS: 4.0000 mg | ORAL_TABLET | Freq: Three times a day (TID) | ORAL | Status: DC | PRN

## 2016-02-10 MED ORDER — ZOLPIDEM TARTRATE 5 MG PO TABS
5.0000 mg | ORAL_TABLET | Freq: Every evening | ORAL | Status: DC | PRN
  Administered 2016-02-11: 5 mg via ORAL
  Filled 2016-02-10: qty 1

## 2016-02-10 MED ORDER — LORAZEPAM 1 MG PO TABS: 1.0000 mg | ORAL_TABLET | Freq: Three times a day (TID) | ORAL | Status: DC | PRN

## 2016-02-10 MED ORDER — LORAZEPAM 1 MG PO TABS
2.0000 mg | ORAL_TABLET | Freq: Once | ORAL | Status: AC
  Administered 2016-02-10: 2 mg via ORAL
  Filled 2016-02-10: qty 2

## 2016-02-10 NOTE — ED Notes (Signed)
Pt refused blood draws

## 2016-02-10 NOTE — ED Notes (Signed)
FNP at bedside.

## 2016-02-10 NOTE — ED Notes (Signed)
Security in with patient.  Patient and belongings are being wanded.

## 2016-02-10 NOTE — ED Notes (Signed)
Pt has in belonging bag:  White sandals, plaid pj pants, blue shirt.

## 2016-02-10 NOTE — ED Notes (Signed)
Pt states he is having suicidal for the past few days. Pt states he felt worse today after using crack. Pt states he had not used crack for 3 years until today. Pt states he used crack to cope with his sister dying last week. Pt states he would overdose on pills.

## 2016-02-10 NOTE — ED Provider Notes (Signed)
CSN: [id number]     Arrival date & time 02/10/16  2214 History  By signing my name below, I, Phillis Haggis, attest that this documentation has been prepared under the direction and in the presence of Earley Favor, NP-C. Electronically Signed: Phillis Haggis, ED Scribe. 02/10/2016. 11:58 PM.   Chief Complaint  Patient presents with  . Suicidal   The history is provided by the patient. No language interpreter was used.  HPI Comments: Ian Ruiz is a 45 y.o. Male with a hx of substance abuse who presents to the Emergency Department complaining of SI. Pt states that he is upset and his anxiety is through the roof because he used crack today. He states that he has been clean for the past 3 years, "but slipped up and smoked crack today." He reports that this is due to his sister dying last week. He is tearful and states that "I don't want to be here anymore. I just want to die." He states that he last took his Klonopin a few days ago. He asked that we please leave the room because he wanted to be left alone. He denies other symptoms.   Past Medical History  Diagnosis Date  . Ulcerative colitis   . Meniere disease   . Substance abuse    Past Surgical History  Procedure Laterality Date  . Fracture surgery     Family History  Problem Relation Age of Onset  . COPD Father   . Heart attack Father    Social History  Substance Use Topics  . Smoking status: Former Smoker -- 0.50 packs/day for 20 years    Types: Cigarettes    Quit date: 08/14/2001  . Smokeless tobacco: Never Used  . Alcohol Use: No     Comment: 1 month substance free    Review of Systems  Psychiatric/Behavioral: Positive for suicidal ideas. The patient is nervous/anxious.   All other systems reviewed and are negative.  Allergies  Clonidine derivatives and Penicillins  Home Medications   Prior to Admission medications   Medication Sig Start Date End Date Taking? Authorizing Provider  gabapentin (NEURONTIN) 100 MG  capsule Take 2 capsules (200 mg total) by mouth 2 (two) times daily. 02/11/16   Charm Rings, NP  hydrOXYzine (ATARAX/VISTARIL) 50 MG tablet Take 1 tablet (50 mg total) by mouth every 6 (six) hours as needed for anxiety. 02/11/16   Charm Rings, NP   BP 107/68 mmHg  Pulse 70  Temp(Src) 97.7 F (36.5 C) (Oral)  Resp 20  SpO2 96% Physical Exam  Constitutional: He is oriented to person, place, and time. He appears well-developed and well-nourished. No distress.  HENT:  Head: Normocephalic and atraumatic.  Mouth/Throat: Oropharynx is clear and moist. No oropharyngeal exudate.  Eyes: Conjunctivae and EOM are normal. Pupils are equal, round, and reactive to light.  Neck: Normal range of motion. Neck supple.  Musculoskeletal: Normal range of motion.  Neurological: He is alert and oriented to person, place, and time.  Skin: Skin is warm and dry.  Psychiatric: He has a normal mood and affect. His behavior is normal.  Pt is tearful, rocking in his chair. He states, "Please leave, I want to be left alone."    ED Course  Procedures (including critical care time) DIAGNOSTIC STUDIES: Oxygen Saturation is 100% on RA, normal by my interpretation.    COORDINATION OF CARE: 11:47 PM-Discussed treatment plan which includes labs with pt at bedside and pt agreed to plan.    Labs  Review Labs Reviewed  COMPREHENSIVE METABOLIC PANEL - Abnormal; Notable for the following:    Glucose, Bld 111 (*)    All other components within normal limits  ACETAMINOPHEN LEVEL - Abnormal; Notable for the following:    Acetaminophen (Tylenol), Serum <10 (*)    All other components within normal limits  CBC - Abnormal; Notable for the following:    WBC 12.8 (*)    All other components within normal limits  ETHANOL  SALICYLATE LEVEL    Imaging Review No results found. I have personally reviewed and evaluated these images and lab results as part of my medical decision-making.   EKG Interpretation None       MDM   Final diagnoses:  Cocaine abuse  Cocaine-induced mood disorder (HCC)    I personally performed the services described in this documentation, which was scribed in my presence. The recorded information has been reviewed and is accurate.   Earley FavorGail Amy Gothard, NP 02/13/16 2022  Dione Boozeavid Glick, MD 02/15/16 [phone]

## 2016-02-11 DIAGNOSIS — F1410 Cocaine abuse, uncomplicated: Secondary | ICD-10-CM | POA: Diagnosis present

## 2016-02-11 DIAGNOSIS — F1494 Cocaine use, unspecified with cocaine-induced mood disorder: Secondary | ICD-10-CM | POA: Diagnosis present

## 2016-02-11 LAB — COMPREHENSIVE METABOLIC PANEL
ALBUMIN: 4.5 g/dL (ref 3.5–5.0)
ALK PHOS: 54 U/L (ref 38–126)
ALT: 17 U/L (ref 17–63)
ANION GAP: 8 (ref 5–15)
AST: 19 U/L (ref 15–41)
BILIRUBIN TOTAL: 1.2 mg/dL (ref 0.3–1.2)
BUN: 16 mg/dL (ref 6–20)
CO2: 24 mmol/L (ref 22–32)
CREATININE: 1.12 mg/dL (ref 0.61–1.24)
Calcium: 9.5 mg/dL (ref 8.9–10.3)
Chloride: 107 mmol/L (ref 101–111)
GFR calc Af Amer: >60 mL/min (ref >60)
GFR calc non Af Amer: >60 mL/min (ref >60)
GLUCOSE: 111 mg/dL — AB (ref 65–99)
Potassium: 4 mmol/L (ref 3.5–5.1)
Sodium: 139 mmol/L (ref 135–145)
TOTAL PROTEIN: 8.1 g/dL (ref 6.5–8.1)

## 2016-02-11 LAB — CBC
HCT: 43 % (ref 39.0–52.0)
Hemoglobin: 14.6 g/dL (ref 13.0–17.0)
MCH: 30.2 pg (ref 26.0–34.0)
MCHC: 34 g/dL (ref 30.0–36.0)
MCV: 88.8 fL (ref 78.0–100.0)
PLATELETS: 309 10*3/uL (ref 150–400)
RBC: 4.84 MIL/uL (ref 4.22–5.81)
RDW: 13.6 % (ref 11.5–15.5)
WBC: 12.8 10*3/uL — ABNORMAL HIGH (ref 4.0–10.5)

## 2016-02-11 LAB — SALICYLATE LEVEL: Salicylate Lvl: <4 mg/dL (ref 2.8–30.0)

## 2016-02-11 LAB — ETHANOL: Alcohol, Ethyl (B): <5 mg/dL (ref <5)

## 2016-02-11 LAB — ACETAMINOPHEN LEVEL: Acetaminophen (Tylenol), Serum: <10 ug/mL — ABNORMAL LOW (ref 10–30)

## 2016-02-11 MED ORDER — HYDROXYZINE HCL 25 MG PO TABS: 50.0000 mg | ORAL_TABLET | Freq: Four times a day (QID) | ORAL | Status: DC | PRN

## 2016-02-11 MED ORDER — GABAPENTIN 100 MG PO CAPS: 200.0000 mg | ORAL_CAPSULE | Freq: Two times a day (BID) | ORAL | Status: DC

## 2016-02-11 MED ORDER — GABAPENTIN 100 MG PO CAPS
200.0000 mg | ORAL_CAPSULE | Freq: Two times a day (BID) | ORAL | Status: DC
  Administered 2016-02-11: 200 mg via ORAL
  Filled 2016-02-11: qty 2

## 2016-02-11 MED ORDER — HYDROXYZINE HCL 50 MG PO TABS: 50.0000 mg | ORAL_TABLET | Freq: Four times a day (QID) | ORAL | Status: DC | PRN

## 2016-02-11 NOTE — [facility] Suicide Risk Assessment (Signed)
Suicide Risk Assessment  Discharge Assessment   [facility]   Principal Problem: Cocaine-induced mood disorder [facility](HCC) Discharge Diagnoses:  Patient Active Problem List   Diagnosis Date Noted  . Cocaine abuse [F14.10] 02/11/2016    Priority: High  . Cocaine-induced mood disorder (HCC) [F14.94] 02/11/2016    Priority: High  . Acute pancreatitis [K85.90] 08/12/2012  . UTI (lower urinary tract infection) [N39.0] 09/20/2011  . ARF (acute renal failure) (HCC) [N17.9] 09/19/2011  . Abdominal pain, acute, bilateral lower quadrant [R10.31, R10.32] 09/19/2011  . Leukocytosis [D72.829] 09/19/2011  . Suicidal ideation [R45.851] 09/19/2011  . Bipolar disorder (manic depression) (HCC) [F31.9] 09/19/2011  . Ascites [789.5] 09/19/2011  . Ulcerative colitis [556] 09/19/2011  . Meniere disease [H81.09] 09/19/2011  . Polysubstance abuse [F19.10] 09/19/2011  . 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 He appears well-developed and well-nourished.  HENT:  Head: Normocephalic.  Neck: Normal range of motion.  Respiratory: Effort normal.  GI: Soft.  Musculoskeletal: Normal range of motion.  Neurological: He is alert and oriented to person, place, and time.  Skin: Skin is warm and dry.  Psychiatric: His speech is normal and behavior is normal. Judgment and thought content normal. His mood appears anxious. Cognition and memory are normal.    Review of Systems  Constitutional: Negative.   HENT: Negative.   Eyes: Negative.   Respiratory: Negative.   Cardiovascular: Negative.   Gastrointestinal: Negative.   Genitourinary: Negative.   Musculoskeletal: Negative.   Skin: Negative.   Neurological: Negative.    Endo/Heme/Allergies: Negative.   Psychiatric/Behavioral: Positive for substance abuse. The patient is nervous/anxious.     Blood pressure 94/65, pulse 71, temperature 97.9 F (36.6 C), temperature source Oral, resp. rate 17, SpO2 97 %.There is no weight on file to calculate BMI.  General Appearance: Casual  Eye Contact:  Fair  Speech:  Normal Rate  Volume:  Normal  Mood:  Anxious, mild  Affect:  Congruent  Thought Process:  Coherent and Descriptions of Associations: Intact  Orientation:  Full (Time, Place, and Person)  Thought Content:  WDL  Suicidal Thoughts:  No  Homicidal Thoughts:  No  Memory:  Immediate;   Good Recent;   Good Remote;   Good  Judgement:  Fair  Insight:  Fair  Psychomotor Activity:  Normal  Concentration:  Concentration: Good and Attention Span: Good  Recall:  Good  Fund of Knowledge:  Fair  Language:  Good  Akathisia:  No  Handed:  Right  AIMS (if indicated):     Assets:  Leisure Time Physical Health Resilience  ADL's:  Intact  Cognition:  WNL  Sleep:      Mental Status Per Nursing Assessment::   On Admission:   anxiety and suicidal ideations after cocaine use  Demographic Factors:  Male and Caucasian  Loss Factors: NA  Historical Factors: NA  Risk Reduction Factors:   Sense of responsibility to family  Continued Clinical Symptoms:  Anxiety, mild  Cognitive Features That Contribute To Risk:  None    Suicide Risk:  Minimal: No identifiable suicidal ideation.  Patients presenting with no risk factors but with morbid ruminations; may be classified as minimal risk based on the severity of the depressive symptoms    Plan Of Care/Follow-up recommendations:  Activity:  as tolerated Diet:  heart healthy diet  LORD, JAMISON, NP 02/11/2016, 11:14 AM

## 2016-02-11 NOTE — Progress Notes (Signed)
Pt.  Was agitated upon arrival.  Pt. Did request a sandwich and juice which he received.  Pt. Stated that he did not want to discuss the reason for his admission with Clinical research associatewriter.  Pt. Had received ativan prior to transfer.  Writer adm. ambien for sleep.  Pt.  Would only say that he contracts for safety "Right Now" but refused to comment further.  Pt. Is resting quietly and remains safe on the unit.

## 2016-02-11 NOTE — ED Notes (Signed)
TTS into see 

## 2016-02-11 NOTE — [facility] Assessment (Signed)
[facility] Assessment Progress Note   Patient is too sleepy to participate in assessment at this time.  He had been given ativan and ambien earlier in the evening.

## 2016-02-11 NOTE — ED Notes (Addendum)
Written dc instruction and prescriptions reviewed with pt.  Pt encouraged to take medications as directed, [facility].  Pt also encouraged to return/seek treatment for return of suicidal thoughts/urges.  Pt verbalized understanding.

## 2016-02-11 NOTE — ED Notes (Addendum)
Pt ambulatory w/o difficulty to dc area w/ mHt, belongings returned after leaving the area.  Bus pass given. Request for shelter to allow pt to be on bed rest x1 day given.

## 2016-02-11 NOTE — Progress Notes (Signed)
Prior pt pt being d/c he was offered a list of [place] uninsured resources in case [place]  [place] address listed  CM discussed and provided written information to assist pt with determining choice for uninsured accepting pcps, discussed the importance of pcp vs EDP services for f/u care, www.needymeds.org, www.goodrx.com, discounted pharmacies and other [place] resources such as [facility] , [facility], affordable care act, financial assistance, uninsured dental services, [facility], DSS and  health department  Reviewed resources for [place] uninsured accepting pcps like [facility], [facility] [address], [facility], [facility], local urgent care centers, [facility], [facility], general medical clinics, [facility] [place], [facility] urgent care plus others, medication resources, [facility] out [facility]ies and housing Provided [facility] contact information

## 2016-02-11 NOTE — BH Assessment (Addendum)
Assessment Note  Ian Ruiz is an 45 y.o. male. Writer met with patient to complete a face to face TTS assessment. Upon entering patient's room he stated, "Please leave me alone.Marland KitchenMarland KitchenI'm trying to sleep". Writer then explained to patient that his cooperation in participating in the TTS assessment is a useful tool in helping his symptoms. Patient stated, "I told you I'm not doing no assessment and I'm going to tell you again.Marland KitchenMarland KitchenGet out I'm trying to sleep". Writer was unable to confirm of deny SI, HI, and AVH's.  Collateral information obtained from EDP notes:  "Ian Ruiz is a 45 y.o. Male with a hx of substance abuse who presents to the Emergency Department complaining of SI. Pt states that he is upset and his anxiety is through the roof because he used crack today. He states that he has been clean for the past 3 years, "but slipped up and smoked crack today." He reports that this is due to his sister dying last week. He is tearful and states that "I don't want to be here anymore. I just want to die." He states that he last took his Klonopin a few days ago. He asked that we please leave the room because he wanted to be left alone. He denies other symptoms."       Diagnosis: Major Depressive Disorder, Recurrent, Severe, with Psychotic Features and Substance Use     Past Medical History:  Past Medical History  Diagnosis Date  . Ulcerative colitis   . Meniere disease   . Substance abuse     Past Surgical History  Procedure Laterality Date  . Fracture surgery      Family History:  Family History  Problem Relation Age of Onset  . COPD Father   . Heart attack Father     Social History:  reports that he quit smoking about 14 years ago. His smoking use included Cigarettes. He has a 10 pack-year smoking history. He has never used smokeless tobacco. He reports that he does not drink alcohol or use illicit drugs.  Additional Social History:  Alcohol / Drug Use Pain Medications:   only takes prescribed medications Prescriptions: see above information already reviewed Over the Counter: none History of alcohol / drug use?: Yes Longest period of sobriety (when/how long): would not discuss Negative Consequences of Use:  (patient refused to answer) Withdrawal Symptoms:  (Patient refused to answer) Substance #1 Name of Substance 1: Patient refused to answer questions regarding substance abuse and alcohol; NO UDS as of 02/11/2016 @ 07010; BAL is negative; Patient did report crack cocains use to EDP.  1 - Age of First Use: unk 1 - Amount (size/oz): unk 1 - Frequency: unk 1 - Duration: unk 1 - Last Use / Amount: unk  CIWA: CIWA-Ar BP: 94/65 mmHg Pulse Rate: 71 COWS:    Allergies:  Allergies  Allergen Reactions  . Clonidine Derivatives     Muscle spasms.  . Penicillins Rash    Has patient had a PCN reaction causing immediate rash, facial/tongue/throat swelling, SOB or lightheadedness with hypotension: [place] Has patient had a PCN reaction causing severe rash involving mucus membranes or skin necrosis: Nono Has patient had a PCN reaction that required hospitalization Nono Has patient had a PCN reaction occurring within the last 10 years: [place] If all of the above answers are "NO", then may proceed with Cephalospori    Home Medications:  (Not in a hospital admission)  OB/GYN Status:  No LMP for male patient.  General Assessment  Data Location of Assessment: [facility] TTS Assessment: In system Is this a Tele or Face-to-Face Assessment?: Face-to-Face Is this an Initial Assessment or a Re-assessment for this encounter?: Initial Assessment Marital status:  (unk; patient refused to answer) Ian Ruiz name:  (n/a) Is patient pregnant?: No Pregnancy Status: No Living Arrangements: Other (Comment) (unk; patient refused to answer) Can pt return to current living arrangement?:  (unk) Admission Status: Voluntary Is patient capable of signing voluntary admission?:  Yes Referral Source: Self/Family/Friend Insurance type:  (Self Pay )     Crisis Care Plan Living Arrangements: Other (Comment) (unk; patient refused to answer) Legal Guardian:  (unk) Name of Psychiatrist:  (unk) Name of Therapist:  (unk)  Education Status Is patient currently in school?:  (unk) Current Grade:  (unk) Highest grade of school patient has completed:  (unk) Name of school:  (n/a) Contact person:  (n/a)  Risk to self with the past 6 months Suicidal Ideation: Yes-Currently Present (unable to confirm or deny w/ pt; per ED notes pt reports SI) Has patient been a risk to self within the past 6 months prior to admission? :  (pt refused to answer questions; per EDP notes pt reported SI) Suicidal Intent:  (unk) Has patient had any suicidal intent within the past 6 months prior to admission? :  (unk) Is patient at risk for suicide?: Yes (Yes due to reports upon arrival of SI) Suicidal Plan?:  (unk) Has patient had any suicidal plan within the past 6 months prior to admission? :  (unk) Access to Means:  (unk) What has been your use of drugs/alcohol within the last 12 months?:  (unk) Previous Attempts/Gestures: No How many times?:  (unk) Other Self Harm Risks:  (unk) Triggers for Past Attempts:  (unk) Intentional Self Injurious Behavior:  (unk) Family Suicide History: Unknown Recent stressful life event(s):  (unk) Persecutory voices/beliefs?:  (unk) Depression:  (unk) Depression Symptoms: Feeling angry/irritable (patient appears angry/irritable; refused to answer ) Substance abuse history and/or treatment for substance abuse?:  (unk) Suicide prevention information given to non-admitted patients:  (unk)  Risk to Others within the past 6 months Homicidal Ideation:  (unk) Does patient have any lifetime risk of violence toward others beyond the six months prior to admission? : Unknown Thoughts of Harm to Others:  (unk) Current Homicidal Intent:  (unk) Current Homicidal  Plan:  (unk) Access to Homicidal Means:  (unk) Identified Victim:  (unk) History of harm to others?:  (unk) Assessment of Violence:  (unk) Violent Behavior Description:  (patient uncooperative with answering questions) Does patient have access to weapons?:  (unk) Criminal Charges Pending?:  (unk) Does patient have a court date:  (unk) Is patient on probation?: Unknown  Psychosis Hallucinations: None noted Delusions: None noted  Mental Status Report Appearance/Hygiene: In scrubs Eye Contact: Poor Motor Activity: Freedom of movement Speech: Argumentative Level of Consciousness: Sleeping Mood: Angry Affect: Unable to Assess Anxiety Level:  (patient has anxiety per ED notes; patient refused to answer) Thought Processes: Unable to Assess Judgement: Unable to Assess Orientation: Unable to assess Obsessive Compulsive Thoughts/Behaviors: Unable to Assess  Cognitive Functioning Concentration: Unable to Assess Memory: Unable to Assess IQ: Average Insight: Unable to Assess Impulse Control: Unable to Assess Appetite:  (unk) Weight Loss:  (unk) Weight Gain:  (unk) Sleep: Unable to Assess Total Hours of Sleep:  (unk) Vegetative Symptoms: Unable to Assess  ADLScreening [facility] Assessment Services) Patient's cognitive ability adequate to safely complete daily activities?:  (unk; patient refused to answer ) Patient able to  express need for assistance with ADLs?:  (unk; patient refused to answer ) Independently performs ADLs?:  (unk; patient refused to answer )  Prior Inpatient Therapy Prior Inpatient Therapy:  (unk) Prior Therapy Dates:  (unk) Prior Therapy Facilty/Provider(s):  (unk) Reason for Treatment:  (unk)  Prior Outpatient Therapy Prior Outpatient Therapy:  (unk) Prior Therapy Dates:  (unk) Prior Therapy Facilty/Provider(s):  (unk) Reason for Treatment:  (unk) Does patient have an ACCT team?: Unknown Does patient have Intensive In-House Services?  : Unknown Does  patient have [facility] services? : Unknown Does patient have [facility] services?: Unknown  ADL Screening (condition at time of admission) Patient's cognitive ability adequate to safely complete daily activities?:  (unk; patient refused to answer ) Is the patient deaf or have difficulty hearing?:  (unk; patient refused to answer ) Does the patient have difficulty seeing, even when wearing glasses/contacts?:  (unk; patient refused to answer ) Does the patient have difficulty concentrating, remembering, or making decisions?:  (unk; patient refused to answer ) Patient able to express need for assistance with ADLs?:  (unk; patient refused to answer ) Independently performs ADLs?:  (unk; patient refused to answer ) Weakness of Legs:  (unk; patient refused to answer ) Weakness of Arms/Hands:  (unk; patient refused to answer )  Home Assistive Devices/Equipment Home Assistive Devices/Equipment: None    Abuse/Neglect Assessment (Assessment to be complete while patient is alone) Physical Abuse: Denies Verbal Abuse: Denies Sexual Abuse: Denies Exploitation of patient/patient's resources: Denies Self-Neglect: Denies Values / Beliefs Cultural Requests During Hospitalization: None Spiritual Requests During Hospitalization: None   Advance Directives (For Healthcare) Does patient have an advance directive?:  (unk; patient refused to answer questions.) Would patient like information on creating an advanced directive?:  (Patient refused to answer) Nutrition Screen- [facility] Patient's home diet:  (patient refused to answer questions )  Additional Information 1:1 In Past 12 Months?: No CIRT Risk: No Elopement Risk: No Does patient have medical clearance?: No             Disposition:   Writer ran patient by Waylan Boga, DNP. Sts that patient will be evaluated by Psych today. Disposition pending.   On Site Evaluation by:   Reviewed with Physician:    Waldon Merl [facility] 02/11/2016 7:22  AM

## 2016-02-11 NOTE — Discharge Instructions (Signed)
For your ongoing mental health needs, you are advised to follow up with [facility].  If you do not currently have an appointment scheduled, new and returning patients are seen at their walk-in clinic.  Walk-in hours are Monday - Friday from 8:00 am - 3:00 pm.  Walk-in patients are seen on a first come, first served basis.  Try to arrive as early as possible for he best chance of being seen the same day:       [facility]      [address]      [place], [place] [address]      [phone]   To help you maintain a sober lifestyle, a substance abuse treatment program may be beneficial to you.  Contact [facility] at your earliest opportunity to ask about enrolling in their program:       [facility]      [address]      [place], [place] [address]      [phone]      New patients are seen at the walk-in clinic every Tuesday from 9:00 am - 12:00 pm.

## 2016-02-11 NOTE — Consult Note (Signed)
[facility] Consult   Reason for Consult:  Anxiety and suicidal ideations after crack/cocaine Referring Physician:  EDP Patient Identification: Ian Ruiz MRN:  [id number] Principal Diagnosis: Cocaine-induced mood disorder [facility]) Diagnosis:   Patient Active Problem List   Diagnosis Date Noted  . Cocaine abuse [F14.10] 02/11/2016    Priority: High  . Cocaine-induced mood disorder ([place]) [F14.94] 02/11/2016    Priority: High  . Acute pancreatitis [K85.90] 08/12/2012  . UTI (lower urinary tract infection) [N39.0] 09/20/2011  . ARF (acute renal failure) ([place]) [N17.9] 09/19/2011  . Abdominal pain, acute, bilateral lower quadrant [R10.31, R10.32] 09/19/2011  . Leukocytosis [D72.829] 09/19/2011  . Suicidal ideation [R45.851] 09/19/2011  . Bipolar disorder (manic depression) ([place]) [F31.9] 09/19/2011  . Ascites [789.5] 09/19/2011  . Ulcerative colitis [556] 09/19/2011  . Meniere disease [H81.09] 09/19/2011  . Polysubstance abuse [F19.10] 09/19/2011  . 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 male patient does not warrant admission.  HPI:  On admission:  45 y.o. male. Writer met with patient to complete a face to face TTS assessment. Upon entering patient's room he stated, "Please leave me alone.Marland KitchenMarland KitchenI'm trying to sleep". Writer then explained to patient that his cooperation in participating in the TTS assessment is a useful tool in helping his symptoms. Patient stated, "I told you I'm not doing no assessment and I'm going to tell you again.Marland KitchenMarland KitchenGet out I'm trying to sleep". Writer was unable to confirm of deny SI, HI, and AVH's. Collateral information obtained from EDP notes:  "Ian Ruiz is a 45 y.o. Male with a hx of substance abuse who presents to the Emergency Department complaining of SI. Pt states that he is upset and his anxiety is through the roof because he used crack today. He  states that he has been clean for the past 3 years, "but slipped up and smoked crack today." He reports that this is due to his sister dying last week. He is tearful and states that "I don't want to be here anymore. I just want to die." He states that he last took his Klonopin a few days ago. He asked that we please leave the room because he wanted to be left alone. He denies other symptoms."  Today, patient denies suicidal/homicidal ideations, hallucinations, and withdrawal symptoms.  Stable for discharge with resources.  Past Psychiatric History: cocaine abuse,polysubstance abuse  Risk to Self: Suicidal Ideation: Yes-Currently Present (unable to confirm or deny w/ pt; per ED notes pt reports SI) Suicidal Intent:  (unk) Is patient at risk for suicide?: Yes (Yes due to reports upon arrival of SI) Suicidal Plan?:  (unk) Access to Means:  (unk) What has been your use of drugs/alcohol within the last 12 months?:  (unk) How many times?:  (unk) Other Self Harm Risks:  (unk) Triggers for Past Attempts:  (unk) Intentional Self Injurious Behavior:  (unk) Risk to Others: Homicidal Ideation:  (unk) Thoughts of Harm to Others:  (unk) Current Homicidal Intent:  (unk) Current Homicidal Plan:  (unk) Access to Homicidal Means:  (unk) Identified Victim:  (unk) History of harm to others?:  (unk) Assessment of Violence:  (unk) Violent Behavior Description:  (patient uncooperative with answering questions) Does patient have access to weapons?:  (unk) Criminal Charges Pending?:  (unk) Does patient have a court date:  (unk) Prior Inpatient Therapy: Prior Inpatient Therapy:  (unk) Prior Therapy Dates:  (unk) Prior Therapy Facilty/Provider(s):  (unk) Reason  for Treatment:  (unk) Prior Outpatient Therapy: Prior Outpatient Therapy:  (unk) Prior Therapy Dates:  (unk) Prior Therapy Facilty/Provider(s):  (unk) Reason for Treatment:  (unk) Does patient have an ACCT team?: Unknown Does patient have  Intensive In-House Services?  : Unknown Does patient have [facility] services? : Unknown Does patient have P4CC services?: Unknown  Past Medical History:  Past Medical History  Diagnosis Date  . Ulcerative colitis   . Meniere disease   . Substance abuse     Past Surgical History  Procedure Laterality Date  . Fracture surgery     Family History:  Family History  Problem Relation Age of Onset  . COPD Father   . Heart attack Father    Family Psychiatric  History: none Social History:  History  Alcohol Use No    Comment: 1 month substance free     History  Drug Use No    Comment: last used yesterday    Social History   Social History  . Marital Status: Divorced    Spouse Name: N/A  . Number of Children: N/A  . Years of Education: N/A   Social History Main Topics  . Smoking status: Former Smoker -- 0.50 packs/day for 20 years    Types: Cigarettes    Quit date: 08/14/2001  . Smokeless tobacco: Never Used  . Alcohol Use: No     Comment: 1 month substance free  . Drug Use: No     Comment: last used yesterday  . Sexual Activity: Yes   Other Topics Concern  . None   Social History Narrative   Additional Social History:    Allergies:   Allergies  Allergen Reactions  . Clonidine Derivatives     Muscle spasms.  . Penicillins Rash    Has patient had a PCN reaction causing immediate rash, facial/tongue/throat swelling, SOB or lightheadedness with hypotension: N[place]es Has patient had a PCN reaction causing severe rash involving mucus membranes or skin necrosis: Nono Has patient had a PCN reaction that required hospitalization Nono Has patient had a PCN reaction occurring within the last 10 years: Noyes If all of the above answers are "NO", then may proceed with Cephalospori    Labs:  Results for orders placed or performed during the hospital encounter of 02/10/16 (from the past 48 hour(s))  Comprehensive metabolic panel     Status: Abnormal   Collection Time:  02/11/16 12:05 AM  Result Value Ref Range   Sodium 139 135 - 145 mmol/L   Potassium 4.0 3.5 - 5.1 mmol/L   Chloride 107 101 - 111 mmol/L   CO2 24 22 - 32 mmol/L   Glucose, Bld 111 (H) 65 - 99 mg/dL   BUN 16 6 - 20 mg/dL   Creatinine, Ser 1.12 0.61 - 1.24 mg/dL   Calcium 9.5 8.9 - 10.3 mg/dL   Total Protein 8.1 6.5 - 8.1 g/dL   Albumin 4.5 3.5 - 5.0 g/dL   AST 19 15 - 41 U/L   ALT 17 17 - 63 U/L   Alkaline Phosphatase 54 38 - 126 U/L   Total Bilirubin 1.2 0.3 - 1.2 mg/dL   GFR calc non Af Amer >60 >60 mL/min   GFR calc Af Amer >60 >60 mL/min    Comment: (NOTE) The eGFR has been calculated using the CKD EPI equation. This calculation has not been validated in all clinical situations. eGFR's persistently <60 mL/min signify possible Chronic Kidney Disease.    Anion gap 8 5 - 15  Ethanol     Status: None   Collection Time: 02/11/16 12:05 AM  Result Value Ref Range   Alcohol, Ethyl (B) <5 <5 mg/dL    Comment:        LOWEST DETECTABLE LIMIT FOR SERUM ALCOHOL IS 5 mg/dL FOR MEDICAL PURPOSES ONLY   Salicylate level     Status: None   Collection Time: 02/11/16 12:05 AM  Result Value Ref Range   Salicylate Lvl <1.5 2.8 - 30.0 mg/dL  Acetaminophen level     Status: Abnormal   Collection Time: 02/11/16 12:05 AM  Result Value Ref Range   Acetaminophen (Tylenol), Serum <10 (L) 10 - 30 ug/mL    Comment:        THERAPEUTIC CONCENTRATIONS VARY SIGNIFICANTLY. A RANGE OF 10-30 ug/mL MAY BE AN EFFECTIVE CONCENTRATION FOR MANY PATIENTS. HOWEVER, SOME ARE BEST TREATED AT CONCENTRATIONS OUTSIDE THIS RANGE. ACETAMINOPHEN CONCENTRATIONS >150 ug/mL AT 4 HOURS AFTER INGESTION AND >50 ug/mL AT 12 HOURS AFTER INGESTION ARE OFTEN ASSOCIATED WITH TOXIC REACTIONS.   cbc     Status: Abnormal   Collection Time: 02/11/16 12:05 AM  Result Value Ref Range   WBC 12.8 (H) 4.0 - 10.5 K/uL   RBC 4.84 4.22 - 5.81 MIL/uL   Hemoglobin 14.6 13.0 - 17.0 g/dL   HCT 43.0 39.0 - 52.0 %   MCV 88.8 78.0  - 100.0 fL   MCH 30.2 26.0 - 34.0 pg   MCHC 34.0 30.0 - 36.0 g/dL   RDW 13.6 11.5 - 15.5 %   Platelets 309 150 - 400 K/uL    Current Facility-Administered Medications  Medication Dose Route Frequency Provider Last Rate Last Dose  . gabapentin (NEURONTIN) capsule 200 mg  200 mg Oral BID Janiel Derhammer, MD      . hydrOXYzine (ATARAX/VISTARIL) tablet 50 mg  50 mg Oral Q6H PRN Patrecia Pour, NP      . nicotine (NICODERM CQ - dosed in mg/24 hours) patch 21 mg  21 mg Transdermal Daily Junius Creamer, NP   21 mg at 02/11/16 0943  . ondansetron (ZOFRAN) tablet 4 mg  4 mg Oral Q8H PRN Junius Creamer, NP       Current Outpatient Prescriptions  Medication Sig Dispense Refill  . clonazePAM (KLONOPIN) 0.5 MG tablet Take 0.5 mg by mouth 3 (three) times daily as needed for anxiety.    . benzonatate (TESSALON) 100 MG capsule Take 1 capsule (100 mg total) by mouth every 8 (eight) hours. (Patient not taking: Reported on 02/10/2016) 21 capsule 0  . oxyCODONE (ROXICODONE) 15 MG immediate release tablet Take 1 tablet (15 mg total) by mouth every 3 (three) hours as needed. (Patient not taking: Reported on 11/18/2015) 65 tablet 0  . traMADol (ULTRAM) 50 MG tablet Take 1 tablet (50 mg total) by mouth every 6 (six) hours as needed. (Patient not taking: Reported on 11/22/2015) 10 tablet 0   Facility-Administered Medications Ordered in Other Encounters  Medication Dose Route Frequency Provider Last Rate Last Dose  . acetaminophen (TYLENOL) tablet 650 mg  650 mg Oral Q6H PRN Encarnacion Slates, NP      . alum & mag hydroxide-simeth (MAALOX/MYLANTA) 200-200-20 MG/5ML suspension 30 mL  30 mL Oral Q4H PRN Encarnacion Slates, NP      . LORazepam (ATIVAN) tablet 0.5 mg  0.5 mg Oral Q6H PRN Encarnacion Slates, NP      . magnesium hydroxide (MILK OF MAGNESIA) suspension 30 mL  30 mL Oral Daily PRN Loleta Dicker  [place], NP        Musculoskeletal: Strength & Muscle Tone: within normal limits Gait & Station: normal Patient leans: N/A  Psychiatric  Specialty Exam: Physical Exam  Constitutional: He is oriented to person, place, and time. He appears well-developed and well-nourished.  HENT:  Head: Normocephalic.  Neck: Normal range of motion.  Respiratory: Effort normal.  GI: Soft.  Musculoskeletal: Normal range of motion.  Neurological: He is alert and oriented to person, place, and time.  Skin: Skin is warm and dry.  Psychiatric: His speech is normal and behavior is normal. Judgment and thought content normal. His mood appears anxious. Cognition and memory are normal.    Review of Systems  Constitutional: Negative.   HENT: Negative.   Eyes: Negative.   Respiratory: Negative.   Cardiovascular: Negative.   Gastrointestinal: Negative.   Genitourinary: Negative.   Musculoskeletal: Negative.   Skin: Negative.   Neurological: Negative.   Endo/Heme/Allergies: Negative.   Psychiatric/Behavioral: Positive for substance abuse. The patient is nervous/anxious.     Blood pressure 94/65, pulse 71, temperature 97.9 F (36.6 C), temperature source Oral, resp. rate 17, SpO2 97 %.There is no weight on file to calculate BMI.  General Appearance: Casual  Eye Contact:  Fair  Speech:  Normal Rate  Volume:  Normal  Mood:  Anxious, mild  Affect:  Congruent  Thought Process:  Coherent and Descriptions of Associations: Intact  Orientation:  Full (Time, Place, and Person)  Thought Content:  WDL  Suicidal Thoughts:  No  Homicidal Thoughts:  No  Memory:  Immediate;   Good Recent;   Good Remote;   Good  Judgement:  Fair  Insight:  Fair  Psychomotor Activity:  Normal  Concentration:  Concentration: Good and Attention Span: Good  Recall:  Good  Fund of Knowledge:  Fair  Language:  Good  Akathisia:  No  Handed:  Right  AIMS (if indicated):     Assets:  Leisure Time Physical Health Resilience  ADL's:  Intact  Cognition:  WNL  Sleep:        Treatment Plan Summary: Daily contact with patient to assess and evaluate symptoms and  progress in treatment, Medication management and Plan cocaine induced mood disorder:  -Crisis stabilization -Medication management:  Start gabapentin 200 mg BID for withdrawal symptoms and Vistaril 50 mg every six hours PRN anxiety -Individual and substance abuse counseling  Disposition: No evidence of imminent risk to self or others at present.    Waylan Boga, NP 02/11/2016 9:55 AM Patient seen face-to-face for psychiatric evaluation, chart reviewed and case discussed with the physician extender and developed treatment plan. Reviewed the information documented and agree with the treatment plan. Corena Pilgrim, MD

## 2016-02-11 NOTE — BH Assessment (Signed)
[facility] Assessment Progress Note  Per Thedore MinsMojeed Akintayo, MD, this pt does not require psychiatric hospitalization at this time.  [facility] and substance abuse treatment resources.  Discharge instructions [facility] as well as [facility].  Pt's nurse, Wille CelesteJanie, has been notified.  Doylene Canninghomas Burhan Barham, MA Triage Specialist [phone]

## 2016-02-19 ENCOUNTER — Emergency Department (HOSPITAL_COMMUNITY)
Admission: EM | Admit: 2016-02-19 | Discharge: 2016-02-20 | Disposition: A | Discharge status: Other Acute Inpatient Hospital | Payer: Self-pay | Attending: Emergency Medicine | Admitting: Emergency Medicine

## 2016-02-19 ENCOUNTER — Encounter (HOSPITAL_COMMUNITY): Payer: Self-pay | Admitting: Emergency Medicine

## 2016-02-19 DIAGNOSIS — F1910 Other psychoactive substance abuse, uncomplicated: Secondary | ICD-10-CM | POA: Diagnosis present

## 2016-02-19 DIAGNOSIS — R45851 Suicidal ideations: Secondary | ICD-10-CM

## 2016-02-19 DIAGNOSIS — F1914 Other psychoactive substance abuse with psychoactive substance-induced mood disorder: Secondary | ICD-10-CM | POA: Insufficient documentation

## 2016-02-19 DIAGNOSIS — F1994 Other psychoactive substance use, unspecified with psychoactive substance-induced mood disorder: Secondary | ICD-10-CM

## 2016-02-19 DIAGNOSIS — T50902A Poisoning by unspecified drugs, medicaments and biological substances, intentional self-harm, initial encounter: Secondary | ICD-10-CM

## 2016-02-19 DIAGNOSIS — Z87891 Personal history of nicotine dependence: Secondary | ICD-10-CM | POA: Insufficient documentation

## 2016-02-19 DIAGNOSIS — T401X2A Poisoning by heroin, intentional self-harm, initial encounter: Secondary | ICD-10-CM | POA: Insufficient documentation

## 2016-02-19 DIAGNOSIS — T405X2A Poisoning by cocaine, intentional self-harm, initial encounter: Secondary | ICD-10-CM | POA: Insufficient documentation

## 2016-02-19 LAB — CBC
HEMATOCRIT: 41.6 % (ref 39.0–52.0)
HEMOGLOBIN: 15 g/dL (ref 13.0–17.0)
MCH: 31.1 pg (ref 26.0–34.0)
MCHC: 36.1 g/dL — AB (ref 30.0–36.0)
MCV: 86.1 fL (ref 78.0–100.0)
Platelets: 355 10*3/uL (ref 150–400)
RBC: 4.83 MIL/uL (ref 4.22–5.81)
RDW: 13.5 % (ref 11.5–15.5)
WBC: 10.9 10*3/uL — ABNORMAL HIGH (ref 4.0–10.5)

## 2016-02-19 LAB — COMPREHENSIVE METABOLIC PANEL
ALBUMIN: 4.4 g/dL (ref 3.5–5.0)
ALK PHOS: 63 U/L (ref 38–126)
ALT: 30 U/L (ref 17–63)
AST: 38 U/L (ref 15–41)
Anion gap: 11 (ref 5–15)
BILIRUBIN TOTAL: 1.2 mg/dL (ref 0.3–1.2)
BUN: 17 mg/dL (ref 6–20)
CALCIUM: 9.1 mg/dL (ref 8.9–10.3)
CO2: 23 mmol/L (ref 22–32)
CREATININE: 1.35 mg/dL — AB (ref 0.61–1.24)
Chloride: 102 mmol/L (ref 101–111)
GFR calc Af Amer: >60 mL/min (ref >60)
GFR calc non Af Amer: >60 mL/min (ref >60)
GLUCOSE: 167 mg/dL — AB (ref 65–99)
Potassium: 3.3 mmol/L — ABNORMAL LOW (ref 3.5–5.1)
SODIUM: 136 mmol/L (ref 135–145)
TOTAL PROTEIN: 8.1 g/dL (ref 6.5–8.1)

## 2016-02-19 LAB — ETHANOL: Alcohol, Ethyl (B): <5 mg/dL (ref <5)

## 2016-02-19 LAB — ACETAMINOPHEN LEVEL

## 2016-02-19 LAB — SALICYLATE LEVEL: Salicylate Lvl: <4 mg/dL (ref 2.8–30.0)

## 2016-02-19 MED ORDER — ONDANSETRON HCL 4 MG PO TABS: 4.0000 mg | ORAL_TABLET | Freq: Three times a day (TID) | ORAL | Status: DC | PRN

## 2016-02-19 MED ORDER — HYDROXYZINE HCL 25 MG PO TABS
25.0000 mg | ORAL_TABLET | Freq: Four times a day (QID) | ORAL | Status: DC | PRN
  Administered 2016-02-19: 25 mg via ORAL
  Filled 2016-02-19: qty 1

## 2016-02-19 MED ORDER — LOPERAMIDE HCL 2 MG PO CAPS: 2.0000 mg | ORAL_CAPSULE | ORAL | Status: DC | PRN

## 2016-02-19 MED ORDER — ALUM & MAG HYDROXIDE-SIMETH 200-200-20 MG/5ML PO SUSP
30.0000 mL | ORAL | Status: DC | PRN
Start: 2016-02-19 — End: 2016-02-20
  Administered 2016-02-19: 30 mL via ORAL
  Filled 2016-02-19: qty 30

## 2016-02-19 MED ORDER — NAPROXEN 500 MG PO TABS
500.0000 mg | ORAL_TABLET | Freq: Two times a day (BID) | ORAL | Status: DC | PRN
  Administered 2016-02-19: 500 mg via ORAL
  Filled 2016-02-19: qty 1

## 2016-02-19 MED ORDER — NICOTINE 21 MG/24HR TD PT24: 21.0000 mg | MEDICATED_PATCH | Freq: Every day | TRANSDERMAL | Status: DC

## 2016-02-19 MED ORDER — METHOCARBAMOL 500 MG PO TABS: 500.0000 mg | ORAL_TABLET | Freq: Three times a day (TID) | ORAL | Status: DC | PRN

## 2016-02-19 MED ORDER — POTASSIUM CHLORIDE CRYS ER 20 MEQ PO TBCR
40.0000 meq | EXTENDED_RELEASE_TABLET | Freq: Once | ORAL | Status: AC
  Administered 2016-02-19: 40 meq via ORAL
  Filled 2016-02-19: qty 2

## 2016-02-19 MED ORDER — DICYCLOMINE HCL 20 MG PO TABS: 20.0000 mg | ORAL_TABLET | Freq: Four times a day (QID) | ORAL | Status: DC | PRN

## 2016-02-19 NOTE — ED Notes (Signed)
Per EMS: Pt BIB [facility].  Pt states that he did heroin this morning around 0900.  Pt states that he considered paying for another night in the hotel and overdose on heroin.  States that he tried to overdose last night.  Thought he took enough to do it.  Denies HI.  Denies etoh.

## 2016-02-19 NOTE — ED Notes (Signed)
Patient noted in room. No complaints, stable, in no acute distress. Q15 minute rounds and monitoring via security cameras continue for safety. 

## 2016-02-19 NOTE — ED Notes (Signed)
Bed: WBH41 Expected date:  Expected time:  Means of arrival:  Comments: Tr 4 

## 2016-02-19 NOTE — Progress Notes (Signed)
Pt ambulatory to [facility] with a steady gait. Presents with blunt affect and irritable mood. Reports being anxious as well. Per report and as confirmed by pt, [facility] he attempted to OD on Heroin in a hotel last night, was unsuccessful and had plans to re-attempt tonight. Pt denies HI, AVH and pain at the time. Endorsed SI without a plan at present. Emotional support and encouragement provided to pt. Safety maintained on Q 15 minutes checks without incident to note at this time.

## 2016-02-19 NOTE — ED Provider Notes (Signed)
CSN: [id number]     Arrival date & time 02/19/16  1135 History   First MD Initiated Contact with Patient 02/19/16 1246     Chief Complaint  Patient presents with  . Suicidal     (Consider location/radiation/quality/duration/timing/severity/associated sxs/prior Treatment) HPI Ian Ruiz is a 45 y.o. male with history of ulcerative colitis, substance abuse, presents to emergency department complaining of suicidal ideations. Patient states that he has been doing heroin and cocaine, and large quantities, with purpose of overdosing. Patient states that he no longer wants to live. Denies homicidal ideations. He states he last used last night and again this morning, and he thought he did enough to do it but realized that he needed help and called EMS.    Past Medical History  Diagnosis Date  . Ulcerative colitis   . Meniere disease   . Substance abuse    Past Surgical History  Procedure Laterality Date  . Fracture surgery     Family History  Problem Relation Age of Onset  . COPD Father   . Heart attack Father    Social History  Substance Use Topics  . Smoking status: Former Smoker -- 0.50 packs/day for 20 years    Types: Cigarettes    Quit date: 08/14/2001  . Smokeless tobacco: Never Used  . Alcohol Use: No     Comment: 1 month substance free    Review of Systems  Constitutional: Negative for fever and chills.  Respiratory: Negative for cough, chest tightness and shortness of breath.   Cardiovascular: Negative for chest pain, palpitations and leg swelling.  Gastrointestinal: Negative for nausea, vomiting, abdominal pain, diarrhea and abdominal distention.  Genitourinary: Negative for dysuria, urgency, frequency and hematuria.  Musculoskeletal: Negative for myalgias, arthralgias, neck pain and neck stiffness.  Skin: Negative for rash.  Allergic/Immunologic: Negative for immunocompromised state.  Neurological: Negative for dizziness, weakness, light-headedness, numbness  and headaches.  Psychiatric/Behavioral: Positive for suicidal ideas and self-injury. The patient is nervous/anxious.   All other systems reviewed and are negative.     Allergies  Clonidine derivatives and Penicillins  Home Medications   Prior to Admission medications   Medication Sig Start Date End Date Taking? Authorizing Provider  clonazePAM (KLONOPIN) 1 MG tablet Take 1 mg by mouth 2 (two) times daily.   Yes Historical Provider, MD  sertraline (ZOLOFT) 100 MG tablet Take 100 mg by mouth daily.   Yes Historical Provider, MD  gabapentin (NEURONTIN) 100 MG capsule Take 2 capsules (200 mg total) by mouth 2 (two) times daily. Patient not taking: Reported on 02/19/2016 02/11/16   Charm RingsJamison Y Lord, NP  hydrOXYzine (ATARAX/VISTARIL) 50 MG tablet Take 1 tablet (50 mg total) by mouth every 6 (six) hours as needed for anxiety. Patient not taking: Reported on 02/19/2016 02/11/16   Charm RingsJamison Y Lord, NP   BP 103/81 mmHg  Pulse 102  Temp(Src) 98.4 F (36.9 C) (Oral)  Resp 16  SpO2 95% Physical Exam  Constitutional: He is oriented to person, place, and time. He appears well-developed and well-nourished. No distress.  HENT:  Head: Normocephalic and atraumatic.  Eyes: Conjunctivae are normal.  Neck: Neck supple.  Cardiovascular: Normal rate, regular rhythm and normal heart sounds.   Pulmonary/Chest: Effort normal. No respiratory distress. He has no wheezes. He has no rales.  Abdominal: Soft. Bowel sounds are normal. He exhibits no distension. There is no tenderness. There is no rebound.  Musculoskeletal: He exhibits no edema.  Neurological: He is alert and oriented to person, place, and  time.  Skin: Skin is warm and dry.  Psychiatric: He has a normal mood and affect.  Appears intoxicated  Nursing note and vitals reviewed.   ED Course  Procedures (including critical care time) Labs Review Labs Reviewed  COMPREHENSIVE METABOLIC PANEL - Abnormal; Notable for the following:    Potassium 3.3 (*)     Glucose, Bld 167 (*)    Creatinine, Ser 1.35 (*)    All other components within normal limits  ACETAMINOPHEN LEVEL - Abnormal; Notable for the following:    Acetaminophen (Tylenol), Serum <10 (*)    All other components within normal limits  CBC - Abnormal; Notable for the following:    WBC 10.9 (*)    MCHC 36.1 (*)    All other components within normal limits  ETHANOL  SALICYLATE LEVEL  URINE RAPID DRUG SCREEN, HOSP PERFORMED    Imaging Review No results found. I have personally reviewed and evaluated these images and lab results as part of my medical decision-making.   EKG Interpretation None      MDM   Final diagnoses:  Polysubstance abuse  Overdose, intentional self-harm, initial encounter (HCC)   Pt in ED, states overdosed on heroin and cocaine on purpose. Pt is awake, alert, able to keep a conversation although appears intoxicated. Will get med clearance labs and TTS consult.    She is medically cleared other than waiting on drug screen. Pending TTS assessment.  Jaynie Crumbleatyana Yarrow Linhart, PA-C 02/19/16 1633  [name], MD 02/26/16 [phone]

## 2016-02-19 NOTE — ED Notes (Signed)
Bed: [facility]4 Expected date:  Expected time:  Means of arrival:  Comments: EMS psych

## 2016-02-19 NOTE — BH Assessment (Addendum)
Assessment Note   Ian Ruiz is an 45 y.o. male who [facility] with thoughts of SI to overdose on Heroin and cocaine. He states that he tried to overdose this morning but he "didn't have enough" so he called 911. He states that he has been living in a homeless shelter and is feeling hopeless and depressed because he has tried to stop using drugs but he has been unsuccessful. He states that he has been using marijuana, cocaine, heroin and methamphetimines daily for years. He currently denies HI or A/V hallucinations. He states that he is currently taking medication for depression which he receives from [facility]. Pt was irritable during assessment and stated several times that he "didn't want to talk about this stuff again and just wanted to be left alone."   Disposition: Per Nanine MeansJamison Lord NP, pt does not meet inpatient criteria and it is recommended that pt be placed in observation unit to follow up with mental health and substance abuse resources.   Diagnosis: Substance induced mood disorder   Past Medical History:  Past Medical History  Diagnosis Date  . Ulcerative colitis   . Meniere disease   . Substance abuse     Past Surgical History  Procedure Laterality Date  . Fracture surgery      Family History:  Family History  Problem Relation Age of Onset  . COPD Father   . Heart attack Father     Social History:  reports that he quit smoking about 14 years ago. His smoking use included Cigarettes. He has a 10 pack-year smoking history. He has never used smokeless tobacco. He reports that he does not drink alcohol or use illicit drugs.  Additional Social History:  Alcohol / Drug Use History of alcohol / drug use?: Yes Negative Consequences of Use: Financial, Personal relationships Withdrawal Symptoms: Agitation Substance #1 Name of Substance 1: Heroin 1 - Age of First Use: 20+ years  1 - Amount (size/oz): unspecified  1 - Frequency: daily  1 - Duration: unspecified  1 -  Last Use / Amount: This AM  Substance #2 Name of Substance 2: Cocaine 2 - Age of First Use: 20+ years  2 - Amount (size/oz): unspecified  2 - Frequency: daily  2 - Duration: unspecified  2 - Last Use / Amount: this AM  Substance #3 Name of Substance 3: Marijuana  3 - Age of First Use: 20+ years  3 - Amount (size/oz): unspecified  3 - Frequency: daily  3 - Duration: unspecified  3 - Last Use / Amount: this AM   CIWA: CIWA-Ar BP: 103/81 mmHg Pulse Rate: 102 COWS:    PATIENT STRENGTHS: (choose at least two) Average or above average intelligence General fund of knowledge Motivation for treatment/growth  Allergies:  Allergies  Allergen Reactions  . Clonidine Derivatives     Muscle spasms.  . Penicillins Rash    Has patient had a PCN reaction causing immediate rash, facial/tongue/throat swelling, SOB or lightheadedness with hypotension: Yesyes Has patient had a PCN reaction causing severe rash involving mucus membranes or skin necrosis: Nono Has patient had a PCN reaction that required hospitalization Nono Has patient had a PCN reaction occurring within the last 10 years: Linwood DibblesYesyes If all of the above answers are "NO", then may proceed with Cephalospori    Home Medications:  (Not in a hospital admission)  OB/GYN Status:  No LMP for male patient.  General Assessment Data [facility] of Assessment: [facility] T[facility] Assessment: In system Is  this a Tele or Face-to-Face Assessment?: Face-to-Face Is this an Initial Assessment or a Re-assessment for this encounter?: Initial Assessment Is patient pregnant?: No Pregnancy Status: No Living Arrangements: Other (Comment) (Shelter ) Can pt return to current living arrangement?: No Admission Status: Voluntary Is patient capable of signing voluntary admission?: Yes Referral Source: Self/Family/Friend Insurance type:  (Self pay)     Crisis Care Plan Living Arrangements: Other (Comment) (Shelter ) Name of Psychiatrist: Transport planner Name of  Therapist: [facility]   Education Status Highest grade of school patient has completed: College  Risk to self with the past 6 months Suicidal Ideation: Yes-Currently Present Has patient been a risk to self within the past 6 months prior to admission? : Yes Suicidal Intent: Yes-Currently Present Has patient had any suicidal intent within the past 6 months prior to admission? : Yes Is patient at risk for suicide?: Yes Suicidal Plan?: Yes-Currently Present Has patient had any suicidal plan within the past 6 months prior to admission? : Yes Specify Current Suicidal Plan:  (Overdose on heroin and cocaine) Access to Means: Yes Specify Access to Suicidal Means:  (access to cocaine and heroin) What has been your use of drugs/alcohol within the last 12 months?: using drugs daily  Previous Attempts/Gestures: No How many times?: 0 Other Self Harm Risks: severe chronic drug addiction Triggers for Past Attempts: None known Intentional Self Injurious Behavior:  (chronic substance use) Family Suicide History: Unknown Recent stressful life event(s):  (not being able to quit using drugs) Persecutory voices/beliefs?: No Depression: Yes Depression Symptoms: Feeling worthless/self pity Substance abuse history and/or treatment for substance abuse?: Yes Suicide prevention information given to non-admitted patients: Not applicable  Risk to Others within the past 6 months Homicidal Ideation: No Does patient have any lifetime risk of violence toward others beyond the six months prior to admission? : No Thoughts of Harm to Others: No Current Homicidal Intent: No Current Homicidal Plan: No Access to Homicidal Means: No Identified Victim: none History of harm to others?: No Assessment of Violence: None Noted Violent Behavior Description: none Does patient have access to weapons?: No Criminal Charges Pending?: No Does patient have a court date: No Is patient on probation?:  No  Psychosis Hallucinations: None noted Delusions: None noted  Mental Status Report Appearance/Hygiene: In scrubs Eye Contact: Poor Motor Activity: Freedom of movement Speech: Aggressive, Logical/coherent Level of Consciousness: Irritable Mood: Angry Affect: Angry Anxiety Level: None Thought Processes: Coherent Judgement: Impaired Orientation: Person, Place, Time, Situation Obsessive Compulsive Thoughts/Behaviors: Unable to Assess  Cognitive Functioning Concentration: Decreased Memory: Recent Intact, Remote Intact IQ: Average Insight: Poor Impulse Control: Poor Appetite: Poor Weight Loss: 20 Weight Gain: 0 Sleep: Decreased Total Hours of Sleep: 6 Vegetative Symptoms: None  ADLScreening [facility] Assessment Services) Patient's cognitive ability adequate to safely complete daily activities?: Yes Patient able to express need for assistance with ADLs?: Yes Independently performs ADLs?: Yes (appropriate for developmental age)  Prior Inpatient Therapy Prior Inpatient Therapy: Yes Prior Therapy Dates: unknown Prior Therapy Facilty/Provider(s):  (Unknown) Reason for Treatment: SI   Prior Outpatient Therapy Prior Outpatient Therapy: No Does patient have an ACCT team?: No Does patient have Intensive In-House Services?  : No Does patient have [facility] services? : No Does patient have P4[facility] services?: No  ADL Screening (condition at time of admission) Patient's cognitive ability adequate to safely complete daily activities?: Yes Is the patient deaf or have difficulty hearing?: No Does the patient have difficulty seeing, even when wearing glasses/contacts?: No Does the patient have difficulty  concentrating, remembering, or making decisions?: No Patient able to express need for assistance with ADLs?: Yes Does the patient have difficulty dressing or bathing?: No Independently performs ADLs?: Yes (appropriate for developmental age) Does the patient have difficulty walking or  climbing stairs?: No Weakness of Legs: None Weakness of Arms/Hands: None  Home Assistive Devices/Equipment Home Assistive Devices/Equipment: None  Therapy Consults (therapy consults require a physician order) PT Evaluation Needed: No OT Evalulation Needed: No SLP Evaluation Needed: No Abuse/Neglect Assessment (Assessment to be complete while patient is alone) Physical Abuse: Denies Verbal Abuse: Denies Sexual Abuse: Denies Exploitation of patient/patient's resources: Denies Self-Neglect: Denies Values / Beliefs Cultural Requests During Hospitalization: None Spiritual Requests During Hospitalization: None Consults Spiritual Care Consult Needed: No Social Work Consult Needed: No Merchant navy officerAdvance Directives (For Healthcare) Does patient have an advance directive?: No Would patient like information on creating an advanced directive?: No - patient declined information Nutrition Screen- [facility] Patient's home diet: Regular Has the patient recently lost weight without trying?: Yes, 14-23 lbs. Has the patient been eating poorly because of a decreased appetite?: Yes Malnutrition Screening Tool Score: 3  Additional Information 1:1 In Past 12 Months?: No CIRT Risk: No Elopement Risk: No Does patient have medical clearance?: Yes     Disposition:  Disposition Initial Assessment Completed for this Encounter: Yes Disposition of Patient: Other dispositions Other disposition(s):  (Observation unit)  Colleen Donahoe 02/19/2016 3:46 PM

## 2016-02-20 ENCOUNTER — Observation Stay (HOSPITAL_COMMUNITY)
Admission: AD | Admit: 2016-02-20 | Discharge: 2016-02-21 | Disposition: A | Discharge status: Home / Self Care | Payer: Self-pay | Source: Intra-hospital | Attending: Psychiatry | Admitting: Psychiatry

## 2016-02-20 ENCOUNTER — Encounter (HOSPITAL_COMMUNITY): Payer: Self-pay | Admitting: Registered Nurse

## 2016-02-20 DIAGNOSIS — F1910 Other psychoactive substance abuse, uncomplicated: Secondary | ICD-10-CM

## 2016-02-20 DIAGNOSIS — F1424 Cocaine dependence with cocaine-induced mood disorder: Principal | ICD-10-CM | POA: Insufficient documentation

## 2016-02-20 DIAGNOSIS — Z880 Allergy status to penicillin: Secondary | ICD-10-CM | POA: Insufficient documentation

## 2016-02-20 DIAGNOSIS — F1924 Other psychoactive substance dependence with psychoactive substance-induced mood disorder: Secondary | ICD-10-CM | POA: Insufficient documentation

## 2016-02-20 DIAGNOSIS — F1994 Other psychoactive substance use, unspecified with psychoactive substance-induced mood disorder: Secondary | ICD-10-CM

## 2016-02-20 DIAGNOSIS — F1920 Other psychoactive substance dependence, uncomplicated: Secondary | ICD-10-CM

## 2016-02-20 DIAGNOSIS — R45851 Suicidal ideations: Secondary | ICD-10-CM

## 2016-02-20 DIAGNOSIS — F4010 Social phobia, unspecified: Secondary | ICD-10-CM | POA: Insufficient documentation

## 2016-02-20 DIAGNOSIS — Z590 Homelessness: Secondary | ICD-10-CM | POA: Insufficient documentation

## 2016-02-20 DIAGNOSIS — F1494 Cocaine use, unspecified with cocaine-induced mood disorder: Secondary | ICD-10-CM

## 2016-02-20 DIAGNOSIS — F1290 Cannabis use, unspecified, uncomplicated: Secondary | ICD-10-CM | POA: Insufficient documentation

## 2016-02-20 DIAGNOSIS — F1410 Cocaine abuse, uncomplicated: Secondary | ICD-10-CM | POA: Diagnosis present

## 2016-02-20 DIAGNOSIS — K5190 Ulcerative colitis, unspecified, without complications: Secondary | ICD-10-CM | POA: Insufficient documentation

## 2016-02-20 DIAGNOSIS — Z888 Allergy status to other drugs, medicaments and biological substances status: Secondary | ICD-10-CM | POA: Insufficient documentation

## 2016-02-20 DIAGNOSIS — F1124 Opioid dependence with opioid-induced mood disorder: Secondary | ICD-10-CM | POA: Insufficient documentation

## 2016-02-20 DIAGNOSIS — F319 Bipolar disorder, unspecified: Secondary | ICD-10-CM | POA: Insufficient documentation

## 2016-02-20 DIAGNOSIS — Z87891 Personal history of nicotine dependence: Secondary | ICD-10-CM | POA: Insufficient documentation

## 2016-02-20 LAB — RAPID URINE DRUG SCREEN, HOSP PERFORMED
Amphetamines: POSITIVE — AB
Barbiturates: NOT DETECTED
Benzodiazepines: POSITIVE — AB
Cocaine: POSITIVE — AB
Opiates: POSITIVE — AB
Tetrahydrocannabinol: POSITIVE — AB

## 2016-02-20 MED ORDER — ONDANSETRON HCL 4 MG PO TABS: 4.0000 mg | ORAL_TABLET | Freq: Three times a day (TID) | ORAL | Status: DC | PRN

## 2016-02-20 MED ORDER — ALUM & MAG HYDROXIDE-SIMETH 200-200-20 MG/5ML PO SUSP: 30.0000 mL | ORAL | Status: DC | PRN

## 2016-02-20 MED ORDER — NICOTINE 21 MG/24HR TD PT24
21.0000 mg | MEDICATED_PATCH | Freq: Every day | TRANSDERMAL | Status: DC
  Filled 2016-02-20: qty 1

## 2016-02-20 MED ORDER — METHOCARBAMOL 500 MG PO TABS: 500.0000 mg | ORAL_TABLET | Freq: Three times a day (TID) | ORAL | Status: DC | PRN

## 2016-02-20 MED ORDER — LOPERAMIDE HCL 2 MG PO CAPS: 2.0000 mg | ORAL_CAPSULE | ORAL | Status: DC | PRN

## 2016-02-20 MED ORDER — MAGNESIUM HYDROXIDE 400 MG/5ML PO SUSP: 30.0000 mL | Freq: Every day | ORAL | Status: DC | PRN

## 2016-02-20 MED ORDER — HYDROXYZINE HCL 25 MG PO TABS
25.0000 mg | ORAL_TABLET | Freq: Four times a day (QID) | ORAL | Status: DC | PRN
Start: 2016-02-20 — End: 2016-02-21

## 2016-02-20 MED ORDER — DICYCLOMINE HCL 20 MG PO TABS
20.0000 mg | ORAL_TABLET | Freq: Four times a day (QID) | ORAL | Status: DC | PRN
Start: 2016-02-20 — End: 2016-02-21

## 2016-02-20 MED ORDER — ACETAMINOPHEN 325 MG PO TABS: 650.0000 mg | ORAL_TABLET | Freq: Four times a day (QID) | ORAL | Status: DC | PRN

## 2016-02-20 MED ORDER — NAPROXEN 500 MG PO TABS: 500.0000 mg | ORAL_TABLET | Freq: Two times a day (BID) | ORAL | Status: DC | PRN

## 2016-02-20 NOTE — Consult Note (Signed)
[facility] Consult   Reason for Consult:  Suicidal Ideation Referring Physician:  EDP Patient Identification: Ian Ruiz MRN:  [id number] Principal Diagnosis: <principal problem not specified> Diagnosis:   Patient Active Problem List   Diagnosis Date Noted  . Cocaine abuse [F14.10] 02/11/2016  . Cocaine-induced mood disorder ([place]) [F14.94] 02/11/2016  . Acute pancreatitis [K85.90] 08/12/2012  . UTI (lower urinary tract infection) [N39.0] 09/20/2011  . ARF (acute renal failure) ([place]) [N17.9] 09/19/2011  . Abdominal pain, acute, bilateral lower quadrant [R10.31, R10.32] 09/19/2011  . Leukocytosis [D72.829] 09/19/2011  . Suicidal ideation [R45.851] 09/19/2011  . Bipolar disorder (manic depression) ([place]) [F31.9] 09/19/2011  . Ascites [789.5] 09/19/2011  . Ulcerative colitis [556] 09/19/2011  . Meniere disease [H81.09] 09/19/2011  . Polysubstance abuse [F19.10] 09/19/2011  . 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 male patient presented to [facility] with complaints of suicidal ideation.  HPI:  Patient in bed states that he does not feel like talking.  "I can't stop using; I've tried so many times and it's wrecking my life."  Stats that he lives in [place] in shelter. His family in [place].  Patient angry and irritable about questions asked during interview.  "Just let me leave so I can slit my fucking wrist."  Past Psychiatric History: Multiple inpatient hospitalization related to substance abuse.  Depression  Risk to Self: Suicidal Ideation: Yes-Currently Present Suicidal Intent: Yes-Currently Present Is patient at risk for suicide?: Yes Suicidal Plan?: Yes-Currently Present Specify Current Suicidal Plan:  (Overdose on heroin and cocaine) Access to Means: Yes Specify Access to Suicidal Means:  (access to cocaine and heroin) What has been your use of drugs/alcohol within  the last 12 months?: using drugs daily  How many times?: 0 Other Self Harm Risks: severe chronic drug addiction Triggers for Past Attempts: None known Intentional Self Injurious Behavior:  (chronic substance use) Risk to Others: Homicidal Ideation: No Thoughts of Harm to Others: No Current Homicidal Intent: No Current Homicidal Plan: No Access to Homicidal Means: No Identified Victim: none History of harm to others?: No Assessment of Violence: None Noted Violent Behavior Description: none Does patient have access to weapons?: No Criminal Charges Pending?: No Does patient have a court date: No Prior Inpatient Therapy: Prior Inpatient Therapy: Yes Prior Therapy Dates: unknown Prior Therapy Facilty/Provider(s):  (Unknown) Reason for Treatment: SI  Prior Outpatient Therapy: Prior Outpatient Therapy: No Does patient have an ACCT team?: No Does patient have Intensive In-House Services?  : No Does patient have [facility] services? : No Does patient have P4CC services?: No  Past Medical History:  Past Medical History  Diagnosis Date  . Ulcerative colitis   . Meniere disease   . Substance abuse     Past Surgical History  Procedure Laterality Date  . Fracture surgery     Family History:  Family History  Problem Relation Age of Onset  . COPD Father   . Heart attack Father    Family Psychiatric  History: Denies  Social History:  History  Alcohol Use No    Comment: 1 month substance free     History  Drug Use No    Comment: last used yesterday    Social History   Social History  . Marital Status: Divorced    Spouse Name: N/A  . Number of Children: N/A  . Years of Education: N/A   Social History Main Topics  .  Smoking status: Former Smoker -- 0.50 packs/day for 20 years    Types: Cigarettes    Quit date: 08/14/2001  . Smokeless tobacco: Never Used  . Alcohol Use: No     Comment: 1 month substance free  . Drug Use: No     Comment: last used yesterday  . Sexual  Activity: Yes   Other Topics Concern  . None   Social History Narrative   Additional Social History:    Allergies:   Allergies  Allergen Reactions  . Clonidine Derivatives     Muscle spasms.  . Penicillins Rash    Has patient had a PCN reaction causing immediate rash, facial/tongue/throat swelling, SOB or lightheadedness with hypotension: [place] Has patient had a PCN reaction causing severe rash involving mucus membranes or skin necrosis: Nono Has patient had a PCN reaction that required hospitalization Nono Has patient had a PCN reaction occurring within the last 10 years: [place] If all of the above answers are "NO", then may proceed with Cephalospori    Labs:  Results for orders placed or performed during the hospital encounter of 02/19/16 (from the past 48 hour(s))  Comprehensive metabolic panel     Status: Abnormal   Collection Time: 02/19/16 11:54 AM  Result Value Ref Range   Sodium 136 135 - 145 mmol/L   Potassium 3.3 (L) 3.5 - 5.1 mmol/L   Chloride 102 101 - 111 mmol/L   CO2 23 22 - 32 mmol/L   Glucose, Bld 167 (H) 65 - 99 mg/dL   BUN 17 6 - 20 mg/dL   Creatinine, Ser 1.35 (H) 0.61 - 1.24 mg/dL   Calcium 9.1 8.9 - 10.3 mg/dL   Total Protein 8.1 6.5 - 8.1 g/dL   Albumin 4.4 3.5 - 5.0 g/dL   AST 38 15 - 41 U/L   ALT 30 17 - 63 U/L   Alkaline Phosphatase 63 38 - 126 U/L   Total Bilirubin 1.2 0.3 - 1.2 mg/dL   GFR calc non Af Amer >60 >60 mL/min   GFR calc Af Amer >60 >60 mL/min    Comment: (NOTE) The eGFR has been calculated using the CKD EPI equation. This calculation has not been validated in all clinical situations. eGFR's persistently <60 mL/min signify possible Chronic Kidney Disease.    Anion gap 11 5 - 15  cbc     Status: Abnormal   Collection Time: 02/19/16 11:54 AM  Result Value Ref Range   WBC 10.9 (H) 4.0 - 10.5 K/uL   RBC 4.83 4.22 - 5.81 MIL/uL   Hemoglobin 15.0 13.0 - 17.0 g/dL   HCT 41.6 39.0 - 52.0 %   MCV 86.1 78.0 - 100.0 fL   MCH 31.1  26.0 - 34.0 pg   MCHC 36.1 (H) 30.0 - 36.0 g/dL   RDW 13.5 11.5 - 15.5 %   Platelets 355 150 - 400 K/uL  Ethanol     Status: None   Collection Time: 02/19/16 11:55 AM  Result Value Ref Range   Alcohol, Ethyl (B) <5 <5 mg/dL    Comment:        LOWEST DETECTABLE LIMIT FOR SERUM ALCOHOL IS 5 mg/dL FOR MEDICAL PURPOSES ONLY   Salicylate level     Status: None   Collection Time: 02/19/16 11:55 AM  Result Value Ref Range   Salicylate Lvl <5.6 2.8 - 30.0 mg/dL  Acetaminophen level     Status: Abnormal   Collection Time: 02/19/16 11:55 AM  Result Value Ref Range  Acetaminophen (Tylenol), Serum <10 (L) 10 - 30 ug/mL    Comment:        THERAPEUTIC CONCENTRATIONS VARY SIGNIFICANTLY. A RANGE OF 10-30 ug/mL MAY BE AN EFFECTIVE CONCENTRATION FOR MANY PATIENTS. HOWEVER, SOME ARE BEST TREATED AT CONCENTRATIONS OUTSIDE THIS RANGE. ACETAMINOPHEN CONCENTRATIONS >150 ug/mL AT 4 HOURS AFTER INGESTION AND >50 ug/mL AT 12 HOURS AFTER INGESTION ARE OFTEN ASSOCIATED WITH TOXIC REACTIONS.   Rapid urine drug screen (hospital performed)     Status: Abnormal   Collection Time: 02/20/16  1:11 PM  Result Value Ref Range   Opiates POSITIVE (A) NONE DETECTED   Cocaine POSITIVE (A) NONE DETECTED   Benzodiazepines POSITIVE (A) NONE DETECTED   Amphetamines POSITIVE (A) NONE DETECTED   Tetrahydrocannabinol POSITIVE (A) NONE DETECTED   Barbiturates NONE DETECTED NONE DETECTED    Comment:        DRUG SCREEN FOR MEDICAL PURPOSES ONLY.  IF CONFIRMATION IS NEEDED FOR ANY PURPOSE, NOTIFY LAB WITHIN 5 DAYS.        LOWEST DETECTABLE LIMITS FOR URINE DRUG SCREEN Drug Class       Cutoff (ng/mL) Amphetamine      1000 Barbiturate      200 Benzodiazepine   509 Tricyclics       326 Opiates          300 Cocaine          300 THC              50     Current Facility-Administered Medications  Medication Dose Route Frequency Provider Last Rate Last Dose  . alum & mag hydroxide-simeth (MAALOX/MYLANTA)  200-200-20 MG/5ML suspension 30 mL  30 mL Oral PRN Tatyana Kirichenko, PA-C   30 mL at 02/19/16 1844  . dicyclomine (BENTYL) tablet 20 mg  20 mg Oral Q6H PRN Tatyana Kirichenko, PA-C      . hydrOXYzine (ATARAX/VISTARIL) tablet 25 mg  25 mg Oral Q6H PRN Tatyana Kirichenko, PA-C   25 mg at 02/19/16 1844  . loperamide (IMODIUM) capsule 2-4 mg  2-4 mg Oral PRN Tatyana Kirichenko, PA-C      . methocarbamol (ROBAXIN) tablet 500 mg  500 mg Oral Q8H PRN Tatyana Kirichenko, PA-C      . naproxen (NAPROSYN) tablet 500 mg  500 mg Oral BID PRN Tatyana Kirichenko, PA-C   500 mg at 02/19/16 1844  . nicotine (NICODERM CQ - dosed in mg/24 hours) patch 21 mg  21 mg Transdermal Daily Tatyana Kirichenko, PA-C   21 mg at 02/19/16 1633  . ondansetron (ZOFRAN) tablet 4 mg  4 mg Oral Q8H PRN Jeannett Senior, PA-C       Current Outpatient Prescriptions  Medication Sig Dispense Refill  . clonazePAM (KLONOPIN) 1 MG tablet Take 1 mg by mouth 2 (two) times daily.    . sertraline (ZOLOFT) 100 MG tablet Take 100 mg by mouth daily.    [name] gabapentin (NEURONTIN) 100 MG capsule Take 2 capsules (200 mg total) by mouth 2 (two) times daily. (Patient not taking: Reported on 02/19/2016) 60 capsule 0  . hydrOXYzine (ATARAX/VISTARIL) 50 MG tablet Take 1 tablet (50 mg total) by mouth every 6 (six) hours as needed for anxiety. (Patient not taking: Reported on 02/19/2016) 30 tablet 0   Facility-Administered Medications Ordered in Other Encounters  Medication Dose Route Frequency Provider Last Rate Last Dose  . acetaminophen (TYLENOL) tablet 650 mg  650 mg Oral Q6H PRN Encarnacion Slates, NP      . alum &  mag hydroxide-simeth (MAALOX/MYLANTA) 200-200-20 MG/5ML suspension 30 mL  30 mL Oral Q4H PRN Encarnacion Slates, NP      . LORazepam (ATIVAN) tablet 0.5 mg  0.5 mg Oral Q6H PRN Encarnacion Slates, NP      . magnesium hydroxide (MILK OF MAGNESIA) suspension 30 mL  30 mL Oral Daily PRN Encarnacion Slates, NP        Musculoskeletal: Strength & Muscle Tone:  within normal limits Gait & Station: normal Patient leans: N/A  Psychiatric Specialty Exam: Physical Exam  Constitutional: He is oriented to person, place, and time.  Neck: Normal range of motion.  Respiratory: Effort normal.  Musculoskeletal: Normal range of motion.  Neurological: He is alert and oriented to person, place, and time.    Review of Systems  Psychiatric/Behavioral: Positive for depression, suicidal ideas and substance abuse. Negative for hallucinations.  All other systems reviewed and are negative.   Blood pressure 105/69, pulse 63, temperature 97.8 F (36.6 C), temperature source Oral, resp. rate 18, SpO2 99 %.There is no weight on file to calculate BMI.  General Appearance: Disheveled  Eye Contact:  Minimal  Speech:  Clear and Coherent  Volume:  Increased  Mood:  Anxious, Depressed and Irritable  Affect:  Depressed  Thought Process:  Coherent  Orientation:  Full (Time, Place, and Person)  Thought Content:  Denies hallucinations, delusions, and paranoai  Suicidal Thoughts:  Yes.  without intent/plan  Homicidal Thoughts:  No  Memory:  Immediate;   Fair Recent;   Fair Remote;   Fair  Judgement:  Fair  Insight:  Lacking and Shallow  Psychomotor Activity:  Normal  Concentration:  Concentration: Fair and Attention Span: Fair  Recall:  [employer] of Knowledge:  Fair  Language:  Fair  Akathisia:  No  Handed:  Right  AIMS (if indicated):     Assets:  Communication Skills Desire for Improvement  ADL's:  Intact  Cognition:  WNL  Sleep:        Treatment Plan Summary: Daily contact with patient to assess and evaluate symptoms and progress in treatment and Plan 24 hour observation recommended  Disposition: 24 hour observation  Rankin, Shuvon, NP 02/20/2016 3:23 PM   Patient seen and I agree with treatment and plan  Levonne Spiller M.D.

## 2016-02-20 NOTE — ED Notes (Addendum)
Pt transported to [facility] Observation Unit by [facility]. All belongings returned to pt who signed for same.  

## 2016-02-20 NOTE — ED Notes (Signed)
When this writer asked pt if he required a nicotine patch he did not answer. It took several tries before he growled, "no."  He turned his head away and would not answer any other questions. When the Dr. Tenny Crawoss talked with him he would not tell her how much heroine he uses, and he was rude. He said, "I just don't want to talk about that."  She said that if she could not properly assess him she would discharge him. He said, "Then I'll just cut my wrist."

## 2016-02-21 DIAGNOSIS — F1920 Other psychoactive substance dependence, uncomplicated: Secondary | ICD-10-CM | POA: Diagnosis present

## 2016-02-21 DIAGNOSIS — F11220 Opioid dependence with intoxication, uncomplicated: Secondary | ICD-10-CM

## 2016-02-21 DIAGNOSIS — F1494 Cocaine use, unspecified with cocaine-induced mood disorder: Secondary | ICD-10-CM

## 2016-02-21 MED ORDER — HYDROXYZINE HCL 50 MG PO TABS
50.0000 mg | ORAL_TABLET | Freq: Four times a day (QID) | ORAL | Status: DC | PRN
  Filled 2016-02-21: qty 10

## 2016-02-21 MED ORDER — CITALOPRAM HYDROBROMIDE 10 MG PO TABS
10.0000 mg | ORAL_TABLET | Freq: Every day | ORAL | Status: DC
  Administered 2016-02-21: 10 mg via ORAL
  Filled 2016-02-21: qty 1
  Filled 2016-02-21: qty 7

## 2016-02-21 MED ORDER — CITALOPRAM HYDROBROMIDE 10 MG PO TABS: 10.0000 mg | ORAL_TABLET | Freq: Every day | ORAL | Status: AC

## 2016-02-21 MED ORDER — GABAPENTIN 100 MG PO CAPS
200.0000 mg | ORAL_CAPSULE | Freq: Two times a day (BID) | ORAL | Status: DC
  Administered 2016-02-21: 200 mg via ORAL
  Filled 2016-02-21: qty 28
  Filled 2016-02-21: qty 2

## 2016-02-21 MED ORDER — GABAPENTIN 100 MG PO CAPS: 200.0000 mg | ORAL_CAPSULE | Freq: Two times a day (BID) | ORAL | Status: AC

## 2016-02-21 MED ORDER — HYDROXYZINE HCL 50 MG PO TABS: 50.0000 mg | ORAL_TABLET | Freq: Four times a day (QID) | ORAL | Status: AC | PRN

## 2016-02-21 NOTE — Progress Notes (Signed)
Patient is upset because he will discharged today. Patient is irritable. He rates his depression/hopelessness/anxiety a 10. He stated that he was going to harm himself if leaving but had no plan as to how to. He denies HI and AVH.  Patient remains safe with constant observation, education, support, and encouragement. Medications were administered.   Patient remains upset, but compliant.

## 2016-02-21 NOTE — Discharge Summary (Signed)
Physician Discharge Summary Note  Patient:  Ian Ruiz is an 45 y.o., male MRN:  [id number] DOB:  Feb 13, 1971 Patient phone:  [phone] (home)  Patient address:   [place] [address],  Total Time spent with patient: 45 minutes  Date of Admission:  02/20/2016 Date of Discharge: 02/21/2016  Reason for Admission:  Polysubstance abuse with suicidal ideations  Principal Problem: Cocaine-induced mood disorder [facility]) Discharge Diagnoses: Patient Active Problem List   Diagnosis Date Noted  . Polysubstance dependence including opioid type drug without complication, episodic abuse [facility]) [F11.220] 02/21/2016    Priority: High  . Cocaine abuse [F14.10] 02/11/2016    Priority: High  . Cocaine-induced mood disorder ([place]) [F14.94] 02/11/2016    Priority: High  . Substance induced mood disorder ([place]) [F19.94] 02/20/2016  . Acute pancreatitis [K85.90] 08/12/2012  . UTI (lower urinary tract infection) [N39.0] 09/20/2011  . ARF (acute renal failure) ([place]) [N17.9] 09/19/2011  . Abdominal pain, acute, bilateral lower quadrant [R10.31, R10.32] 09/19/2011  . Leukocytosis [D72.829] 09/19/2011  . Suicidal ideation [R45.851] 09/19/2011  . Bipolar disorder (manic depression) ([place]) [F31.9] 09/19/2011  . Ascites [789.5] 09/19/2011  . Ulcerative colitis [556] 09/19/2011  . Meniere disease [H81.09] 09/19/2011  . Polysubstance abuse [F19.10] 09/19/2011  . 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 Ulcerative colitis   . Meniere disease   . Substance abuse     Past Surgical History  Procedure Laterality Date  . Fracture surgery     Family History:  Family History  Problem Relation Age of Onset  . COPD Father   . Heart attack Father    Family Psychiatric  History: none Social History:  History  Alcohol Use No    Comment: 1 month substance free      History  Drug Use No    Comment: last used yesterday    Social History   Social History  . Marital Status: Divorced    Spouse Name: N/A  . Number of Children: N/A  . Years of Education: N/A   Social History Main Topics  . Smoking status: Former Smoker -- 0.50 packs/day for 20 years    Types: Cigarettes    Quit date: 08/14/2001  . Smokeless tobacco: Never Used  . Alcohol Use: No     Comment: 1 month substance free  . Drug Use: No     Comment: last used yesterday  . Sexual Activity: Yes   Other Topics Concern  . Not on file   Social History Narrative    Hospital Course: On admission:  45 y.o. male who came to [facility] ED with thoughts of SI to overdose on Heroin and cocaine. He states that he tried to overdose this morning but he "didn't have enough" so he called 911. He states that he has been living in a homeless shelter and is feeling hopeless and depressed because he has tried to stop using drugs but he has been unsuccessful. He states that he has been using marijuana, cocaine, heroin and methamphetimines daily for years. He currently denies HI or A/V hallucinations. He states that he is currently taking medication for depression which he receives from [place]. Pt was irritable during assessment and stated several times that he "didn't want to talk about this stuff again and just wanted to be left alone."   This morning, patient continues to be irritable and demanding of services.  Discussed self-assistance and following up with resources he became angry and threatened to cut his wrist.  He did calm down but remained irritable.  Discussed using his resources and how no treatment was effective without patient input or motivation.  His motivation appears to be demanding and threatening to get his needs mets, discussed this was not appropriate.  He had the tech wash his clothes and encouraged to call the resources provided for rehabs and shelters.  Patient continues to watch television.   When asked about medications, the one he wanted was Klonopin which was told we do not give narcotics and with substance abuse this is not appropriate.  Patient has had lunch and his clothes washed.  Resources were provided for homeless shelters and rehabs along with Rx encouraged to use his resources.  Patient has met maximum benefit of hospitalization and deemed stable for discharge.     Physical Findings: AIMS:  , ,  ,  ,    CIWA:    COWS:     Musculoskeletal: Strength & Muscle Tone: within normal limits Gait & Station: normal Patient leans: N/A  Psychiatric Specialty Exam: Physical Exam  Constitutional: He is oriented to person, place, and time. He appears well-developed and well-nourished.  HENT:  Head: Normocephalic.  Neck: Normal range of motion.  Respiratory: Effort normal.  Musculoskeletal: Normal range of motion.  Neurological: He is alert and oriented to person, place, and time.  Skin: Skin is warm and dry.  Psychiatric: His speech is normal and behavior is normal. Judgment and thought content normal. Cognition and memory are normal. He exhibits a depressed mood.    Review of Systems  Constitutional: Negative.   HENT: Negative.   Eyes: Negative.   Respiratory: Negative.   Cardiovascular: Negative.   Gastrointestinal: Negative.   Genitourinary: Negative.   Musculoskeletal: Negative.   Skin: Negative.   Neurological: Negative.   Endo/Heme/Allergies: Negative.   Psychiatric/Behavioral: Positive for depression and substance abuse.    Blood pressure 102/61, pulse 76, temperature 97.5 F (36.4 C), temperature source Oral, resp. rate 18, height 5' 10"  (1.778 m), weight 80.287 kg (177 lb), SpO2 99 %.Body mass index is 25.4 kg/(m^2).  General Appearance: Casual  Eye Contact:  Good  Speech:  Normal Rate  Volume:  Normal  Mood:  Depressed and Irritable  Affect:  Congruent  Thought Process:  Coherent and Descriptions of Associations: Intact  Orientation:  Full (Time,  Place, and Person)  Thought Content:  WDL  Suicidal Thoughts:  No  Homicidal Thoughts:  No  Memory:  Immediate;   Good Recent;   Good Remote;   Good  Judgement:  Fair  Insight:  Fair  Psychomotor Activity:  Normal  Concentration:  Concentration: Good and Attention Span: Good  Recall:  Good  Fund of Knowledge:  Fair  Language:  Good  Akathisia:  No  Handed:  Right  AIMS (if indicated):     Assets:  Leisure Time Physical Health Resilience  ADL's:  Intact  Cognition:  WNL  Sleep:           Has this patient used any form of tobacco in the last 30 days? (Cigarettes, Smokeless Tobacco, Cigars, and/or Pipes) Yes, Yes, A prescription for an FDA-approved tobacco cessation medication was offered at discharge and the patient refused  Blood Alcohol level:  Lab Results  Component Value Date   [facility] <5 02/19/2016   ETH <5 26/37/8588    Metabolic Disorder Labs:  No results found for: HGBA1C, MPG  No results found for: PROLACTIN Lab Results  Component Value Date   CHOL 165 08/13/2012   TRIG 293* 08/13/2012   HDL 34* 08/13/2012   CHOLHDL 4.9 08/13/2012   VLDL 59* 08/13/2012   LDLCALC 72 08/13/2012    See Psychiatric Specialty Exam and Suicide Risk Assessment completed by Attending Physician prior to discharge.  Discharge destination:  Home  Is patient on multiple antipsychotic therapies at discharge:  No   Has Patient had three or more failed trials of antipsychotic monotherapy by history:  No  Recommended Plan for Multiple Antipsychotic Therapies: NA     Medication List    ASK your doctor about these medications      Indication   gabapentin 100 MG capsule  Commonly known as:  NEURONTIN  Take 2 capsules (200 mg total) by mouth 2 (two) times daily.   Indication:  Aggressive Behavior, Alcohol Withdrawal Syndrome, Cocaine Dependence, Trouble Sleeping, Social Anxiety Disorder     hydrOXYzine 50 MG tablet  Commonly known as:  ATARAX/VISTARIL  Take 1 tablet (50 mg total)  by mouth every 6 (six) hours as needed for anxiety.      sertraline 100 MG tablet  Commonly known as:  ZOLOFT  Take 100 mg by mouth daily.          Follow-up recommendations:  Activity:  as tolerated Diet:  heart healthy diet  Comments:  Patient received Rx and resources for outpatient and inpatient rehabs along with shelter resources.  SignedWaylan Boga, NP 02/21/2016, 10:59 AM  Reviewed the information documented and agree with the treatment plan.  Weston Kallman 02/21/2016 2:22 PM

## 2016-02-21 NOTE — Progress Notes (Signed)
Patient admitted at the time of shift change. Body search done by outgoing nurse. Patient in bed after the shift change. Responded to this Technical sales engineerwriter greetings turn and cover his face. On second attempt to do assessment, patient stated "leave me alone. I just want to sleep. Responded to few yes or no answers and couldn't talk to this writer anymore. Patient instantly fell sleep and remain asleep at this time. Staff maintained a constant observation.

## 2016-02-21 NOTE — H&P (Signed)
Psychiatric Admission Assessment Adult  Patient Identification: Ian Ruiz MRN:  [id number] Date of Evaluation:  02/21/2016 Chief Complaint:  Substance Induced Mood Disorder Principal Diagnosis: Cocaine-induced mood disorder ([place]) Diagnosis:   Patient Active Problem List   Diagnosis Date Noted  . Polysubstance dependence including opioid type drug without complication, episodic abuse [facility]) [F11.220] 02/21/2016    Priority: High  . Cocaine abuse [F14.10] 02/11/2016    Priority: High  . Cocaine-induced mood disorder ([place]) [F14.94] 02/11/2016    Priority: High  . Substance induced mood disorder ([place]) [F19.94] 02/20/2016  . Acute pancreatitis [K85.90] 08/12/2012  . UTI (lower urinary tract infection) [N39.0] 09/20/2011  . ARF (acute renal failure) ([place]) [N17.9] 09/19/2011  . Abdominal pain, acute, bilateral lower quadrant [R10.31, R10.32] 09/19/2011  . Leukocytosis [D72.829] 09/19/2011  . Suicidal ideation [R45.851] 09/19/2011  . Bipolar disorder (manic depression) ([place]) [F31.9] 09/19/2011  . Ascites [789.5] 09/19/2011  . Ulcerative colitis [556] 09/19/2011  . Meniere disease [H81.09] 09/19/2011  . Polysubstance abuse [F19.10] 09/19/2011  . 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 male who came to [facility] ED with thoughts of SI to overdose on Heroin and cocaine. He states that he tried to overdose this morning but he "didn't have enough" so he called 911. He states that he has been living in a homeless shelter and is feeling hopeless and depressed because he has tried to stop using drugs but he has been unsuccessful. He states that he has been using marijuana, cocaine, heroin and methamphetimines daily for years. He currently denies HI or A/V hallucinations. He states that he is currently taking medication for depression which he receives from [place]. Pt was irritable during assessment and stated several times that he "didn't want to talk  about this stuff again and just wanted to be left alone."   Today, patient continues to be irritable and demanding of services.  Discussed self-assistance and following up with resources he became angry and threatened to cut his wrist.  He did calm down but remained irritable.  Discussed using his resources and how no treatment was effective without patient input or motivation.  His motivation appears to be demanding and threatening to get his needs mets, discussed this was not appropriate.  He had the tech wash his clothes and encouraged to call the resources provided for rehabs and shelters.  Patient continues to watch television.  When asked about medications, the one he wanted was Klonopin which was told we do not give narcotics and with substance abuse this is not appropriate.  Associated Signs/Symptoms: Depression Symptoms:  depressed mood, anxiety, (Hypo) Manic Symptoms:  none Anxiety Symptoms:  anxiety Psychotic Symptoms:  none PTSD Symptoms: NA Total Time spent with patient: 45 minutes  Past Psychiatric History: depression, polysubstance abuse  Is the patient at risk to self? No.  Has the patient been a risk to self in the past 6 months? No.  Has the patient been a risk to self within the distant past? No.  Is the patient a risk to others? No.  Has the patient been a risk to others in the past 6 months? No.  Has the patient been a risk to others within the distant past? No.   Prior Inpatient Therapy:  multiple times and places Prior Outpatient Therapy:  resources available and appointments  Alcohol Screening:  no withdrawal symptoms Substance Abuse History in the last 12 months:  Yes.   Consequences of Substance Abuse: Negative Previous  Psychotropic Medications: No  Psychological Evaluations: Yes  Past Medical History:  Past Medical History  Diagnosis Date  . Ulcerative colitis   . Meniere disease   . Substance abuse     Past Surgical History  Procedure Laterality  Date  . Fracture surgery     Family History:  Family History  Problem Relation Age of Onset  . COPD Father   . Heart attack Father    Family Psychiatric  History: none Tobacco Screening: @FLOW ([phone])::1)@ Social History:  History  Alcohol Use No    Comment: 1 month substance free     History  Drug Use No    Comment: last used yesterday    Additional Social History:                           Allergies:   Allergies  Allergen Reactions  . Clonidine Derivatives     Muscle spasms.  . Penicillins Rash    Has patient had a PCN reaction causing immediate rash, facial/tongue/throat swelling, SOB or lightheadedness with hypotension: N[place]es Has patient had a PCN reaction causing severe rash involving mucus membranes or skin necrosis: Nono Has patient had a PCN reaction that required hospitalization Nono Has patient had a PCN reaction occurring within the last 10 years: Noyes If all of the above answers are "NO", then may proceed with Cephalospori   Lab Results:  Results for orders placed or performed during the hospital encounter of 02/19/16 (from the past 48 hour(s))  Comprehensive metabolic panel     Status: Abnormal   Collection Time: 02/19/16 11:54 AM  Result Value Ref Range   Sodium 136 135 - 145 mmol/L   Potassium 3.3 (L) 3.5 - 5.1 mmol/L   Chloride 102 101 - 111 mmol/L   CO2 23 22 - 32 mmol/L   Glucose, Bld 167 (H) 65 - 99 mg/dL   BUN 17 6 - 20 mg/dL   Creatinine, Ser 1.35 (H) 0.61 - 1.24 mg/dL   Calcium 9.1 8.9 - 10.3 mg/dL   Total Protein 8.1 6.5 - 8.1 g/dL   Albumin 4.4 3.5 - 5.0 g/dL   AST 38 15 - 41 U/L   ALT 30 17 - 63 U/L   Alkaline Phosphatase 63 38 - 126 U/L   Total Bilirubin 1.2 0.3 - 1.2 mg/dL   GFR calc non Af Amer >60 >60 mL/min   GFR calc Af Amer >60 >60 mL/min    Comment: (NOTE) The eGFR has been calculated using the CKD EPI equation. This calculation has not been validated in all clinical situations. eGFR's persistently <60 mL/min  signify possible Chronic Kidney Disease.    Anion gap 11 5 - 15  cbc     Status: Abnormal   Collection Time: 02/19/16 11:54 AM  Result Value Ref Range   WBC 10.9 (H) 4.0 - 10.5 K/uL   RBC 4.83 4.22 - 5.81 MIL/uL   Hemoglobin 15.0 13.0 - 17.0 g/dL   HCT 41.6 39.0 - 52.0 %   MCV 86.1 78.0 - 100.0 fL   MCH 31.1 26.0 - 34.0 pg   MCHC 36.1 (H) 30.0 - 36.0 g/dL   RDW 13.5 11.5 - 15.5 %   Platelets 355 150 - 400 K/uL  Ethanol     Status: None   Collection Time: 02/19/16 11:55 AM  Result Value Ref Range   Alcohol, Ethyl (B) <5 <5 mg/dL    Comment:  LOWEST DETECTABLE LIMIT FOR SERUM ALCOHOL IS 5 mg/dL FOR MEDICAL PURPOSES ONLY   Salicylate level     Status: None   Collection Time: 02/19/16 11:55 AM  Result Value Ref Range   Salicylate Lvl <9.0 2.8 - 30.0 mg/dL  Acetaminophen level     Status: Abnormal   Collection Time: 02/19/16 11:55 AM  Result Value Ref Range   Acetaminophen (Tylenol), Serum <10 (L) 10 - 30 ug/mL    Comment:        THERAPEUTIC CONCENTRATIONS VARY SIGNIFICANTLY. A RANGE OF 10-30 ug/mL MAY BE AN EFFECTIVE CONCENTRATION FOR MANY PATIENTS. HOWEVER, SOME ARE BEST TREATED AT CONCENTRATIONS OUTSIDE THIS RANGE. ACETAMINOPHEN CONCENTRATIONS >150 ug/mL AT 4 HOURS AFTER INGESTION AND >50 ug/mL AT 12 HOURS AFTER INGESTION ARE OFTEN ASSOCIATED WITH TOXIC REACTIONS.   Rapid urine drug screen (hospital performed)     Status: Abnormal   Collection Time: 02/20/16  1:11 PM  Result Value Ref Range   Opiates POSITIVE (A) NONE DETECTED   Cocaine POSITIVE (A) NONE DETECTED   Benzodiazepines POSITIVE (A) NONE DETECTED   Amphetamines POSITIVE (A) NONE DETECTED   Tetrahydrocannabinol POSITIVE (A) NONE DETECTED   Barbiturates NONE DETECTED NONE DETECTED    Comment:        DRUG SCREEN FOR MEDICAL PURPOSES ONLY.  IF CONFIRMATION IS NEEDED FOR ANY PURPOSE, NOTIFY LAB WITHIN 5 DAYS.        LOWEST DETECTABLE LIMITS FOR URINE DRUG SCREEN Drug Class       Cutoff  (ng/mL) Amphetamine      1000 Barbiturate      200 Benzodiazepine   240 Tricyclics       973 Opiates          300 Cocaine          300 THC              50     Blood Alcohol level:  Lab Results  Component Value Date   ETH <5 02/19/2016   ETH <5 53/29/9242    Metabolic Disorder Labs:  No results found for: HGBA1C, MPG No results found for: PROLACTIN Lab Results  Component Value Date   CHOL 165 08/13/2012   TRIG 293* 08/13/2012   HDL 34* 08/13/2012   CHOLHDL 4.9 08/13/2012   VLDL 59* 08/13/2012   LDLCALC 72 08/13/2012    Current Medications: Current Facility-Administered Medications  Medication Dose Route Frequency Provider Last Rate Last Dose  . acetaminophen (TYLENOL) tablet 650 mg  650 mg Oral Q6H PRN Patrecia Pour, NP      . alum & mag hydroxide-simeth (MAALOX/MYLANTA) 200-200-20 MG/5ML suspension 30 mL  30 mL Oral Q4H PRN Patrecia Pour, NP      . alum & mag hydroxide-simeth (MAALOX/MYLANTA) 200-200-20 MG/5ML suspension 30 mL  30 mL Oral PRN Patrecia Pour, NP      . dicyclomine (BENTYL) tablet 20 mg  20 mg Oral Q6H PRN Patrecia Pour, NP      . hydrOXYzine (ATARAX/VISTARIL) tablet 25 mg  25 mg Oral Q6H PRN Patrecia Pour, NP      . loperamide (IMODIUM) capsule 2-4 mg  2-4 mg Oral PRN Patrecia Pour, NP      . magnesium hydroxide (MILK OF MAGNESIA) suspension 30 mL  30 mL Oral Daily PRN Patrecia Pour, NP      . methocarbamol (ROBAXIN) tablet 500 mg  500 mg Oral Q8H PRN Patrecia Pour, NP      .  naproxen (NAPROSYN) tablet 500 mg  500 mg Oral BID PRN Patrecia Pour, NP      . nicotine (NICODERM CQ - dosed in mg/24 hours) patch 21 mg  21 mg Transdermal Daily Patrecia Pour, NP   21 mg at 02/21/16 0857  . ondansetron (ZOFRAN) tablet 4 mg  4 mg Oral Q8H PRN Patrecia Pour, NP       Facility-Administered Medications Ordered in Other Encounters  Medication Dose Route Frequency Provider Last Rate Last Dose  . acetaminophen (TYLENOL) tablet 650 mg  650 mg Oral Q6H PRN Encarnacion Slates, NP      . alum & mag hydroxide-simeth (MAALOX/MYLANTA) 200-200-20 MG/5ML suspension 30 mL  30 mL Oral Q4H PRN Encarnacion Slates, NP      . LORazepam (ATIVAN) tablet 0.5 mg  0.5 mg Oral Q6H PRN Encarnacion Slates, NP      . magnesium hydroxide (MILK OF MAGNESIA) suspension 30 mL  30 mL Oral Daily PRN Encarnacion Slates, NP       PTA Medications: Prescriptions prior to admission  Medication Sig Dispense Refill Last Dose  . gabapentin (NEURONTIN) 100 MG capsule Take 2 capsules (200 mg total) by mouth 2 (two) times daily. (Patient not taking: Reported on 02/19/2016) 60 capsule 0 Not Taking at Unknown time  . hydrOXYzine (ATARAX/VISTARIL) 50 MG tablet Take 1 tablet (50 mg total) by mouth every 6 (six) hours as needed for anxiety. (Patient not taking: Reported on 02/19/2016) 30 tablet 0 Not Taking at Unknown time  . sertraline (ZOLOFT) 100 MG tablet Take 100 mg by mouth daily.   Past Week at Unknown time    Musculoskeletal: Strength & Muscle Tone: within normal limits Gait & Station: normal Patient leans: N/A  Psychiatric Specialty Exam: Physical Exam  Constitutional: He is oriented to person, place, and time. He appears well-developed and well-nourished.  HENT:  Head: Normocephalic.  Neck: Normal range of motion.  Respiratory: Effort normal.  Musculoskeletal: Normal range of motion.  Neurological: He is alert and oriented to person, place, and time.  Skin: Skin is warm and dry.  Psychiatric: His speech is normal and behavior is normal. Judgment and thought content normal. His mood appears anxious. Cognition and memory are normal. He exhibits a depressed mood.    Review of Systems  Constitutional: Negative.   HENT: Negative.   Eyes: Negative.   Respiratory: Negative.   Cardiovascular: Negative.   Gastrointestinal: Negative.   Genitourinary: Negative.   Musculoskeletal: Negative.   Skin: Negative.   Neurological: Negative.   Endo/Heme/Allergies: Negative.   Psychiatric/Behavioral:  Positive for depression and substance abuse. The patient is nervous/anxious.     Blood pressure 102/61, pulse 76, temperature 97.5 F (36.4 C), temperature source Oral, resp. rate 18, height 5' 10"  (1.778 m), weight 80.287 kg (177 lb), SpO2 99 %.Body mass index is 25.4 kg/(m^2).  General Appearance: Casual  Eye Contact:  Good  Speech:  Normal Rate  Volume:  Normal  Mood:  Anxious, Depressed and Irritable  Affect:  Congruent  Thought Process:  Coherent and Descriptions of Associations: Intact  Orientation:  Full (Time, Place, and Person)  Thought Content:  WDL  Suicidal Thoughts:  No  Homicidal Thoughts:  No  Memory:  Immediate;   Good Recent;   Good Remote;   Good  Judgement:  Fair  Insight:  Fair  Psychomotor Activity:  Normal  Concentration:  Concentration: Good and Attention Span: Good  Recall:  Good  Fund of  Knowledge:  Fair  Language:  Good  Akathisia:  No  Handed:  Right  AIMS (if indicated):     Assets:  Physical Health Resilience  ADL's:  Intact  Cognition:  WNL  Sleep:          Treatment Plan Summary: Daily contact with patient to assess and evaluate symptoms and progress in treatment, Medication management and Plan cocaine induced mood disorder:  Observation Level/Precautions:  15 minute checks  Laboratory:  completed in ED, reviewed, stable  Psychotherapy:  Individual therapy  Medications:  Vistaril, gabapentin, Zoloft  Consultations:  none  Discharge Concerns:  Homelessness, resources provided  Estimated LOS:  23 hours or less  Other:     I certify that inpatient services furnished can reasonably be expected to improve the patient's condition.    Waylan Boga, NP 6/25/201710:23 AM  Reviewed the information documented and agree with the treatment plan.  Lazaria Schaben 02/21/2016 2:16 PM

## 2016-02-21 NOTE — Progress Notes (Signed)
Patient was discharged at 1314 with belongings (washed), medication samples, medication scripts, AVS, and 2 bus passes.

## 2016-02-21 NOTE — [facility] Suicide Risk Assessment (Signed)
Suicide Risk Assessment  Discharge Assessment   [facility]   Principal Problem: Cocaine-induced mood disorder [facility](HCC) Discharge Diagnoses:  Patient Active Problem List   Diagnosis Date Noted  . Polysubstance dependence including opioid type drug without complication, episodic abuse [facility](HCC) [F11.220] 02/21/2016    Priority: High  . Cocaine abuse [F14.10] 02/11/2016    Priority: High  . Cocaine-induced mood disorder (HCC) [F14.94] 02/11/2016    Priority: High  . Substance induced mood disorder (HCC) [F19.94] 02/20/2016  . Acute pancreatitis [K85.90] 08/12/2012  . UTI (lower urinary tract infection) [N39.0] 09/20/2011  . ARF (acute renal failure) (HCC) [N17.9] 09/19/2011  . Abdominal pain, acute, bilateral lower quadrant [R10.31, R10.32] 09/19/2011  . Leukocytosis [D72.829] 09/19/2011  . Suicidal ideation [R45.851] 09/19/2011  . Bipolar disorder (manic depression) (HCC) [F31.9] 09/19/2011  . Ascites [789.5] 09/19/2011  . Ulcerative colitis [556] 09/19/2011  . Meniere disease [H81.09] 09/19/2011  . Polysubstance abuse [F19.10] 09/19/2011  . 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 He appears well-developed and well-nourished.  HENT:  Head: Normocephalic.  Neck: Normal range of motion.  Respiratory: Effort normal.  Musculoskeletal: Normal range of motion.  Neurological: He is alert and oriented to person, place, and time.  Skin: Skin is warm and dry.  Psychiatric: His speech is normal and behavior is normal. Judgment and thought content normal. Cognition and memory are normal. He exhibits a depressed mood.    Review of Systems  Constitutional: Negative.   HENT: Negative.   Eyes: Negative.    Respiratory: Negative.   Cardiovascular: Negative.   Gastrointestinal: Negative.   Genitourinary: Negative.   Musculoskeletal: Negative.   Skin: Negative.   Neurological: Negative.   Endo/Heme/Allergies: Negative.   Psychiatric/Behavioral: Positive for depression and substance abuse.    Blood pressure 102/61, pulse 76, temperature 97.5 F (36.4 C), temperature source Oral, resp. rate 18, height 5\' 10"  (1.778 m), weight 80.287 kg (177 lb), SpO2 99 %.Body mass index is 25.4 kg/(m^2).  General Appearance: Casual  Eye Contact:  Good  Speech:  Normal Rate  Volume:  Normal  Mood:  Depressed and Irritable  Affect:  Congruent  Thought Process:  Coherent and Descriptions of Associations: Intact  Orientation:  Full (Time, Place, and Person)  Thought Content:  WDL  Suicidal Thoughts:  No  Homicidal Thoughts:  No  Memory:  Immediate;   Good Recent;   Good Remote;   Good  Judgement:  Fair  Insight:  Fair  Psychomotor Activity:  Normal  Concentration:  Concentration: Good and Attention Span: Good  Recall:  Good  Fund of Knowledge:  Fair  Language:  Good  Akathisia:  No  Handed:  Right  AIMS (if indicated):     Assets:  Leisure Time Physical Health Resilience  ADL's:  Intact  Cognition:  WNL  Sleep:      Mental Status Per Nursing Assessment::   On Admission:   polysubstance abuse and suicidal ideations  Demographic Factors:  Male and Caucasian  Loss Factors: NA  Historical Factors: NA  Risk Reduction Factors:   Sense of responsibility to family  Continued Clinical Symptoms:  Depression and irritability   Cognitive Features That Contribute To Risk:  None    Suicide Risk:  Minimal: No identifiable suicidal ideation.  Patients presenting with no risk factors but with morbid ruminations; may be classified as minimal risk based on the severity of the depressive symptoms    Plan Of Care/Follow-up recommendations:  Activity:  as tolerated Diet:  heart healthy  diet  Jamyia Fortune, NP 02/21/2016, 11:07 AM

## 2016-02-26 ENCOUNTER — Encounter (HOSPITAL_COMMUNITY): Payer: Self-pay | Admitting: Emergency Medicine

## 2016-02-26 ENCOUNTER — Emergency Department (HOSPITAL_COMMUNITY)
Admission: EM | Admit: 2016-02-26 | Discharge: 2016-02-26 | Disposition: A | Discharge status: Home / Self Care | Payer: Self-pay | Attending: Emergency Medicine | Admitting: Emergency Medicine

## 2016-02-26 ENCOUNTER — Emergency Department (HOSPITAL_COMMUNITY): Payer: Self-pay

## 2016-02-26 DIAGNOSIS — Z87891 Personal history of nicotine dependence: Secondary | ICD-10-CM | POA: Insufficient documentation

## 2016-02-26 DIAGNOSIS — Y9301 Activity, walking, marching and hiking: Secondary | ICD-10-CM | POA: Insufficient documentation

## 2016-02-26 DIAGNOSIS — Z79899 Other long term (current) drug therapy: Secondary | ICD-10-CM | POA: Insufficient documentation

## 2016-02-26 DIAGNOSIS — W228XXA Striking against or struck by other objects, initial encounter: Secondary | ICD-10-CM | POA: Insufficient documentation

## 2016-02-26 DIAGNOSIS — Y92410 Unspecified street and highway as the place of occurrence of the external cause: Secondary | ICD-10-CM | POA: Insufficient documentation

## 2016-02-26 DIAGNOSIS — Y999 Unspecified external cause status: Secondary | ICD-10-CM | POA: Insufficient documentation

## 2016-02-26 DIAGNOSIS — S93402A Sprain of unspecified ligament of left ankle, initial encounter: Secondary | ICD-10-CM

## 2016-02-26 MED ORDER — NAPROXEN 500 MG PO TABS
500.0000 mg | ORAL_TABLET | Freq: Once | ORAL | Status: AC
  Administered 2016-02-26: 500 mg via ORAL
  Filled 2016-02-26: qty 1

## 2016-02-26 MED ORDER — NAPROXEN 500 MG PO TABS: 500.0000 mg | ORAL_TABLET | Freq: Two times a day (BID) | ORAL | Status: DC

## 2016-02-26 NOTE — ED Notes (Signed)
Per EMS, patient was walking down the street and stepped on metal drain and caused his left ankle to roll.   BP:148/100 HR:98 R:18

## 2016-02-26 NOTE — Discharge Instructions (Signed)
Ankle Sprain °An ankle sprain is an injury to the strong, fibrous tissues (ligaments) that hold the bones of your ankle joint together.  °CAUSES °An ankle sprain is usually caused by a fall or by twisting your ankle. Ankle sprains most commonly occur when you step on the outer edge of your foot, and your ankle turns inward. People who participate in sports are more prone to these types of injuries.  °SYMPTOMS  °· Pain in your ankle. The pain may be present at rest or only when you are trying to stand or walk. °· Swelling. °· Bruising. Bruising may develop immediately or within 1 to 2 days after your injury. °· Difficulty standing or walking, particularly when turning corners or changing directions. °DIAGNOSIS  °Your caregiver will ask you details about your injury and perform a physical exam of your ankle to determine if you have an ankle sprain. During the physical exam, your caregiver will press on and apply pressure to specific areas of your foot and ankle. Your caregiver will try to move your ankle in certain ways. An X-ray exam may be done to be sure a bone was not broken or a ligament did not separate from one of the bones in your ankle (avulsion fracture).  °TREATMENT  °Certain types of braces can help stabilize your ankle. Your caregiver can make a recommendation for this. Your caregiver may recommend the use of medicine for pain. If your sprain is severe, your caregiver may refer you to a surgeon who helps to restore function to parts of your skeletal system (orthopedist) or a physical therapist. °HOME CARE INSTRUCTIONS  °· Apply ice to your injury for 1-2 days or as directed by your caregiver. Applying ice helps to reduce inflammation and pain. °¨ Put ice in a plastic bag. °¨ Place a towel between your skin and the bag. °¨ Leave the ice on for 15-20 minutes at a time, every 2 hours while you are awake. °· Only take over-the-counter or prescription medicines for pain, discomfort, or fever as directed by  your caregiver. °· Elevate your injured ankle above the level of your heart as much as possible for 2-3 days. °· If your caregiver recommends crutches, use them as instructed. Gradually put weight on the affected ankle. Continue to use crutches or a cane until you can walk without feeling pain in your ankle. °· If you have a plaster splint, wear the splint as directed by your caregiver. Do not rest it on anything harder than a pillow for the first 24 hours. Do not put weight on it. Do not get it wet. You may take it off to take a shower or bath. °· You may have been given an elastic bandage to wear around your ankle to provide support. If the elastic bandage is too tight (you have numbness or tingling in your foot or your foot becomes cold and blue), adjust the bandage to make it comfortable. °· If you have an air splint, you may blow more air into it or let air out to make it more comfortable. You may take your splint off at night and before taking a shower or bath. Wiggle your toes in the splint several times per day to decrease swelling. °SEEK MEDICAL CARE IF:  °· You have rapidly increasing bruising or swelling. °· Your toes feel extremely cold or you lose feeling in your foot. °· Your pain is not relieved with medicine. °SEEK IMMEDIATE MEDICAL CARE IF: °· Your toes are numb or blue. °·   You have severe pain that is increasing. MAKE SURE YOU:   Understand these instructions.  Will watch your condition.  Will get help right away if you are not doing well or get worse.   This information is not intended to replace advice given to you by your health care provider. Make sure you discuss any questions you have with your health care provider.  Your xray today did not show any broken bones or fracture. You likely sprained your ankle. Wear brace daily. Apply ice to affected area. Take Naprosyn as needed for pain. Return to the ED if you experience significant swelling or redness around your ankle, fevers,  chills. Otherwise, you may follow up with an orthopedic provider or your primary care doctor.

## 2016-02-26 NOTE — ED Notes (Signed)
Patient transported to X-ray 

## 2016-02-26 NOTE — ED Provider Notes (Signed)
CSN: [id number]     Arrival date & time 02/26/16  1556 History  By signing my name below, I, Doreatha MartinEva Mathews, attest that this documentation has been prepared under the direction and in the presence of Texas InstrumentsSamantha Tripp Dowless, PA-C.  Electronically Signed: Doreatha MartinEva Mathews, ED Scribe. 02/26/2016. 4:54 PM.     Chief Complaint  Patient presents with  . Ankle Injury   The history is provided by the patient. No language interpreter was used.   HPI Comments: Ian Ruiz is a 45 y.o. male who presents to the Emergency Department complaining of moderate, throbbing left ankle pain s/p injury that occurred just PTA. Pt reports he stepped on a metal grate and everted his ankle, causing his pain. Pt denies falls, LOC, head injury or additional injuries. Pt is ambulatory with minimal difficulty secondary to pain. He states that pain is worsened with movement, ambulation and weight-bearing. He reports some relief of pain with ice applied after ED arrival. Pt denies taking OTC medications at home to improve symptoms. He denies numbness, weakness, paresthesia.   Past Medical History  Diagnosis Date  . Ulcerative colitis   . Meniere disease   . Substance abuse    Past Surgical History  Procedure Laterality Date  . Fracture surgery     Family History  Problem Relation Age of Onset  . COPD Father   . Heart attack Father    Social History  Substance Use Topics  . Smoking status: Former Smoker -- 0.50 packs/day for 20 years    Types: Cigarettes    Quit date: 08/14/2001  . Smokeless tobacco: Never Used  . Alcohol Use: No     Comment: 1 month substance free    Review of Systems A complete 10 system review of systems was obtained and all systems are negative except as noted in the HPI and PMH.    Allergies  Clonidine derivatives and Penicillins  Home Medications   Prior to Admission medications   Medication Sig Start Date End Date Taking? Authorizing Provider  citalopram (CELEXA) 10 MG tablet Take  1 tablet (10 mg total) by mouth daily. 02/21/16   Charm RingsJamison Y Lord, NP  gabapentin (NEURONTIN) 100 MG capsule Take 2 capsules (200 mg total) by mouth 2 (two) times daily. 02/21/16   Charm RingsJamison Y Lord, NP  hydrOXYzine (ATARAX/VISTARIL) 50 MG tablet Take 1 tablet (50 mg total) by mouth every 6 (six) hours as needed for anxiety. 02/21/16   Charm RingsJamison Y Lord, NP   BP 125/94 mmHg  Pulse 92  Temp(Src) 97.5 F (36.4 C) (Oral)  Resp 16  Ht 5\' 10"  (1.778 m)  Wt 185 lb (83.915 kg)  BMI 26.54 kg/m2  SpO2 94% Physical Exam  Constitutional: He is oriented to person, place, and time. He appears well-developed and well-nourished. No distress.  HENT:  Head: Normocephalic and atraumatic.  Eyes: Conjunctivae are normal. Right eye exhibits no discharge. Left eye exhibits no discharge. No scleral icterus.  Cardiovascular: Normal rate.   DP pulses 2+ and equal.   Pulmonary/Chest: Effort normal.  Musculoskeletal: Normal range of motion. He exhibits tenderness. He exhibits no edema.  Tenderness to medial malleolus. No obvious deformity or edema. No joint effusion. Able to plantar and dorsiflex. Able to flex and extend toes. Capillary refill less than 3 seconds.     Neurological: He is alert and oriented to person, place, and time. Coordination normal.  Ambulatory with steady gait. Sensation equal and intact to bilateral lower extremities.    Skin: Skin  is warm and dry. No rash noted. He is not diaphoretic. No erythema. No pallor.  Psychiatric: He has a normal mood and affect. His behavior is normal.  Nursing note and vitals reviewed.   ED Course  Procedures (including critical care time) DIAGNOSTIC STUDIES: Oxygen Saturation is 94% on RA, adequate by my interpretation.    COORDINATION OF CARE: 4:40 PM Discussed treatment plan with pt at bedside which includes XR, Naprosyn and pt agreed to plan.   Imaging Review Dg Ankle Complete Left  02/26/2016  CLINICAL DATA:  Fall earlier today with left ankle pain.  Previous fracture post surgery. EXAM: LEFT ANKLE COMPLETE - 3+ VIEW COMPARISON:  None. FINDINGS: Examination demonstrates an orthopedic screw and an adjacent smaller caliber rod bridging an old medial malleolar fracture as hardware is intact. The metallic rod projects 4 mm posterior to the tibial cortex. Ankle mortise is within normal. No acute fracture or dislocation. IMPRESSION: No acute findings. Old medial malleolar fracture post fixation with hardware intact as described. Electronically Signed   By: Elberta Fortisaniel  Boyle M.D.   On: 02/26/2016 17:39   I have personally reviewed and evaluated these images as part of my medical decision-making.   MDM   Final diagnoses:  Ankle sprain, left, initial encounter    Patient X-Ray negative for obvious fracture or dislocation. Likely ankle sprain. Pt is neurovascularly intact. Pt was provided with Naprosyn in the ED for pain management. Pt advised to follow up with orthopedics. Patient given ASO brace while in ED, conservative therapy recommended and discussed. Patient will be discharged home & is agreeable with above plan. Returns precautions discussed. Pt appears safe for discharge.   I personally performed the services described in this documentation, which was scribed in my presence. The recorded information has been reviewed and is accurate.     Lester KinsmanSamantha Tripp NuevoDowless, PA-C 02/26/16 1830  Tilden FossaElizabeth Rees, MD 02/27/16 1135

## 2016-02-29 ENCOUNTER — Encounter (HOSPITAL_COMMUNITY): Payer: Self-pay | Admitting: *Deleted

## 2016-02-29 ENCOUNTER — Emergency Department (HOSPITAL_COMMUNITY)
Admission: EM | Admit: 2016-02-29 | Discharge: 2016-03-01 | Disposition: A | Discharge status: Home / Self Care | Payer: Self-pay | Attending: Emergency Medicine | Admitting: Emergency Medicine

## 2016-02-29 DIAGNOSIS — F1994 Other psychoactive substance use, unspecified with psychoactive substance-induced mood disorder: Secondary | ICD-10-CM | POA: Diagnosis present

## 2016-02-29 DIAGNOSIS — R45851 Suicidal ideations: Secondary | ICD-10-CM | POA: Insufficient documentation

## 2016-02-29 DIAGNOSIS — Z79899 Other long term (current) drug therapy: Secondary | ICD-10-CM | POA: Insufficient documentation

## 2016-02-29 DIAGNOSIS — R440 Auditory hallucinations: Secondary | ICD-10-CM | POA: Insufficient documentation

## 2016-02-29 DIAGNOSIS — F1920 Other psychoactive substance dependence, uncomplicated: Secondary | ICD-10-CM | POA: Diagnosis present

## 2016-02-29 DIAGNOSIS — F17210 Nicotine dependence, cigarettes, uncomplicated: Secondary | ICD-10-CM | POA: Insufficient documentation

## 2016-02-29 DIAGNOSIS — F1910 Other psychoactive substance abuse, uncomplicated: Secondary | ICD-10-CM | POA: Insufficient documentation

## 2016-02-29 LAB — CBC
HCT: 40.3 % (ref 39.0–52.0)
HEMOGLOBIN: 13.5 g/dL (ref 13.0–17.0)
MCH: 30.1 pg (ref 26.0–34.0)
MCHC: 33.5 g/dL (ref 30.0–36.0)
MCV: 90 fL (ref 78.0–100.0)
Platelets: 308 10*3/uL (ref 150–400)
RBC: 4.48 MIL/uL (ref 4.22–5.81)
RDW: 13.9 % (ref 11.5–15.5)
WBC: 10.8 10*3/uL — ABNORMAL HIGH (ref 4.0–10.5)

## 2016-02-29 LAB — COMPREHENSIVE METABOLIC PANEL
ALBUMIN: 4.1 g/dL (ref 3.5–5.0)
ALK PHOS: 49 U/L (ref 38–126)
ALT: 36 U/L (ref 17–63)
AST: 20 U/L (ref 15–41)
Anion gap: 9 (ref 5–15)
BILIRUBIN TOTAL: 0.7 mg/dL (ref 0.3–1.2)
BUN: 14 mg/dL (ref 6–20)
CALCIUM: 9 mg/dL (ref 8.9–10.3)
CO2: 27 mmol/L (ref 22–32)
Chloride: 101 mmol/L (ref 101–111)
Creatinine, Ser: 1.07 mg/dL (ref 0.61–1.24)
GFR calc Af Amer: >60 mL/min (ref >60)
GFR calc non Af Amer: >60 mL/min (ref >60)
GLUCOSE: 99 mg/dL (ref 65–99)
Potassium: 3.5 mmol/L (ref 3.5–5.1)
Sodium: 137 mmol/L (ref 135–145)
TOTAL PROTEIN: 7.1 g/dL (ref 6.5–8.1)

## 2016-02-29 LAB — RAPID URINE DRUG SCREEN, HOSP PERFORMED
Amphetamines: NOT DETECTED
Barbiturates: NOT DETECTED
Benzodiazepines: POSITIVE — AB
Cocaine: POSITIVE — AB
OPIATES: POSITIVE — AB
TETRAHYDROCANNABINOL: POSITIVE — AB

## 2016-02-29 LAB — ACETAMINOPHEN LEVEL

## 2016-02-29 LAB — ETHANOL: Alcohol, Ethyl (B): <5 mg/dL (ref <5)

## 2016-02-29 LAB — SALICYLATE LEVEL: Salicylate Lvl: <4 mg/dL (ref 2.8–30.0)

## 2016-02-29 MED ORDER — GABAPENTIN 100 MG PO CAPS
200.0000 mg | ORAL_CAPSULE | Freq: Two times a day (BID) | ORAL | Status: DC
Start: 2016-02-29 — End: 2016-03-01
  Administered 2016-02-29 (×2): 200 mg via ORAL
  Filled 2016-02-29 (×2): qty 2

## 2016-02-29 MED ORDER — ONDANSETRON HCL 4 MG PO TABS: 4.0000 mg | ORAL_TABLET | Freq: Three times a day (TID) | ORAL | Status: DC | PRN

## 2016-02-29 MED ORDER — NICOTINE 21 MG/24HR TD PT24
21.0000 mg | MEDICATED_PATCH | Freq: Every day | TRANSDERMAL | Status: DC
  Filled 2016-02-29: qty 1

## 2016-02-29 MED ORDER — CITALOPRAM HYDROBROMIDE 10 MG PO TABS
10.0000 mg | ORAL_TABLET | Freq: Every day | ORAL | Status: DC
  Administered 2016-02-29: 10 mg via ORAL
  Filled 2016-02-29: qty 1

## 2016-02-29 MED ORDER — ZOLPIDEM TARTRATE 5 MG PO TABS: 5.0000 mg | ORAL_TABLET | Freq: Every evening | ORAL | Status: DC | PRN

## 2016-02-29 MED ORDER — IBUPROFEN 200 MG PO TABS: 600.0000 mg | ORAL_TABLET | Freq: Three times a day (TID) | ORAL | Status: DC | PRN

## 2016-02-29 NOTE — ED Provider Notes (Signed)
CSN: [id number]     Arrival date & time 02/29/16  1132 History  By signing my name below, I, Alyssa GroveMartin Green, attest that this documentation has been prepared under the direction and in the presence of [employer] Corporationicole Anish Vana, PA. Electronically Signed: Alyssa GroveMartin Green, ED Scribe. 02/29/2016. 1:54 PM.   Chief Complaint  Patient presents with  . Suicidal  . Addiction Problem    The history is provided by the patient. No language interpreter was used.    HPI Comments: Philis PiqueMichael Vandiver is a 45 y.o. male who presents to the Emergency Department with suicidal ideations and an addiction problem. Pt states he has been using cocaine and snorting heroin "non-stop". Patient does not inject heroin, states he only snorts it. Pt denies alcohol abuse. Pt reports associated command hallucinations instructing him to injure himself and sometimes injure others. Pt reports a few attempts to commit suicide through overdose and the cutting his wrists. Pt is non compliant with his Zoloft and Klonopin. Pt states that he currently feels depressed and upset. Pt denies PMHx of HTN, DM, or any other medical problems.  Past Medical History  Diagnosis Date  . Ulcerative colitis   . Meniere disease   . Substance abuse    Past Surgical History  Procedure Laterality Date  . Fracture surgery     Family History  Problem Relation Age of Onset  . COPD Father   . Heart attack Father    Social History  Substance Use Topics  . Smoking status: Current Every Day Smoker -- 0.50 packs/day for 20 years    Types: Cigarettes    Last Attempt to Quit: 08/14/2001  . Smokeless tobacco: Never Used  . Alcohol Use: No     Comment: 1 month substance free    Review of Systems A complete 10 system review of systems was obtained and all systems are negative except as noted in the HPI and PMH.    Allergies  Clonidine derivatives and Penicillins  Home Medications   Prior to Admission medications   Medication Sig Start Date End Date Taking?  Authorizing Provider  citalopram (CELEXA) 10 MG tablet Take 1 tablet (10 mg total) by mouth daily. 02/21/16  Yes Charm RingsJamison Y Lord, NP  gabapentin (NEURONTIN) 100 MG capsule Take 2 capsules (200 mg total) by mouth 2 (two) times daily. 02/21/16  Yes Charm RingsJamison Y Lord, NP  hydrOXYzine (ATARAX/VISTARIL) 50 MG tablet Take 1 tablet (50 mg total) by mouth every 6 (six) hours as needed for anxiety. 02/21/16  Yes Charm RingsJamison Y Lord, NP  naproxen (NAPROSYN) 500 MG tablet Take 1 tablet (500 mg total) by mouth 2 (two) times daily. 02/26/16  Yes Samantha Tripp Dowless, PA-C   BP 113/83 mmHg  Pulse 79  Temp(Src) 98.2 F (36.8 C) (Oral)  Resp 18  SpO2 96% Physical Exam  Constitutional: He is oriented to person, place, and time. He appears well-developed and well-nourished. No distress.  HENT:  Head: Normocephalic and atraumatic.  Mouth/Throat: Oropharynx is clear and moist.  Eyes: Conjunctivae and EOM are normal. Pupils are equal, round, and reactive to light.  Neck: Normal range of motion.  Cardiovascular: Normal rate, regular rhythm and intact distal pulses.   Pulmonary/Chest: Effort normal and breath sounds normal.  Abdominal: Soft. There is no tenderness.  Musculoskeletal: Normal range of motion.  Neurological: He is alert and oriented to person, place, and time.  Skin: He is not diaphoretic.  Psychiatric: His speech is normal. He is slowed. He exhibits a depressed mood. He  expresses homicidal and suicidal ideation. He expresses no suicidal plans and no homicidal plans.  Nursing note and vitals reviewed.   ED Course  Procedures (including critical care time)  DIAGNOSTIC STUDIES: Oxygen Saturation is 96% on RA, adequate by my interpretation.      Labs Review Labs Reviewed  CBC - Abnormal; Notable for the following:    WBC 10.8 (*)    All other components within normal limits  URINE RAPID DRUG SCREEN, HOSP PERFORMED - Abnormal; Notable for the following:    Opiates POSITIVE (*)    Cocaine POSITIVE  (*)    Benzodiazepines POSITIVE (*)    Tetrahydrocannabinol POSITIVE (*)    All other components within normal limits  ACETAMINOPHEN LEVEL - Abnormal; Notable for the following:    Acetaminophen (Tylenol), Serum <10 (*)    All other components within normal limits  COMPREHENSIVE METABOLIC PANEL  ETHANOL  SALICYLATE LEVEL    Imaging Review No results found. I have personally reviewed and evaluated these images and lab results as part of my medical decision-making.   EKG Interpretation None      MDM   Final diagnoses:  Suicidal ideation  Auditory hallucinations  Polysubstance abuse    Filed Vitals:   02/29/16 1135  BP: 113/83  Pulse: 79  Temp: 98.2 F (36.8 C)  TempSrc: Oral  Resp: 18  SpO2: 96%    Medications  citalopram (CELEXA) tablet 10 mg (not administered)  gabapentin (NEURONTIN) capsule 200 mg (not administered)  ondansetron (ZOFRAN) tablet 4 mg (not administered)  nicotine (NICODERM CQ - dosed in mg/24 hours) patch 21 mg (not administered)  zolpidem (AMBIEN) tablet 5 mg (not administered)  ibuprofen (ADVIL,MOTRIN) tablet 600 mg (not administered)    Philis PiqueMichael Hoxworth is 45 y.o. male presenting with Suicidal ideation and vague plan, is also having auditory command hallucinations. Also states he feels depressed.  Patient is medically cleared for psychiatric evaluation will be transferred to the psych ED. TTS consulted, home meds and psych standard holding orders placed.    I personally performed the services described in this documentation, which was scribed in my presence. The recorded information has been reviewed and is accurate.    Wynetta Emeryicole Uchechukwu Dhawan, PA-C 02/29/16 1354  Lorre NickAnthony Allen, MD 02/29/16 [phone]

## 2016-02-29 NOTE — ED Notes (Signed)
Pt states he has been using cocaine and heroin for the past several weeks, states he last used both last night. Pt states he has felt depressed and suicidal since this morning, states he would overdose on heroin. Pt states he has heard voices since last night that tell him "it would be nice to end it all".

## 2016-02-29 NOTE — ED Notes (Signed)
TTS at bedside. 

## 2016-02-29 NOTE — BH Assessment (Addendum)
Assessment Note  Ian Ruiz is a 45 y.o. male who presents voluntarily to [facility] due to [facility] w/ a plan to OD on heroin. Pt also reports having AH telling him " it would be nice to end it all". Pt was just released from Observation Unit a week ago with similar complaints. Pt admits to being non-compliant with his psychotropic meds. Pt admits to a drug addiction and indicates that he needs rehab. Pt reports last receiving drug rehab at [facility] over 5 years ago and indicates that it was helpful.   Diagnosis: Cocaine, Opioid induced depressive disorder; Cocaine, Opioid, Cannabis use d/o, severe  Past Medical History:  Past Medical History  Diagnosis Date  . Ulcerative colitis   . Meniere disease   . Substance abuse     Past Surgical History  Procedure Laterality Date  . Fracture surgery      Family History:  Family History  Problem Relation Age of Onset  . COPD Father   . Heart attack Father     Social History:  reports that he has been smoking Cigarettes.  He has a 10 pack-year smoking history. He has never used smokeless tobacco. He reports that he does not drink alcohol or use illicit drugs.  Additional Social History:  Alcohol / Drug Use Pain Medications: see PTA  meds Prescriptions: see PTA meds Over the Counter: see PTA meds History of alcohol / drug use?: Yes Longest period of sobriety (when/how long): would not discuss Negative Consequences of Use: Financial, Personal relationships Substance #1 Name of Substance 1: Heroin 1 - Age of First Use: 20+ years  1 - Amount (size/oz): unspecified  1 - Frequency: daily  1 - Duration: unspecified  1 - Last Use / Amount: This AM  Substance #2 Name of Substance 2: Cocaine 2 - Age of First Use: 20+ years  2 - Amount (size/oz): unspecified  2 - Frequency: daily  2 - Duration: unspecified  2 - Last Use / Amount: this AM  Substance #3 Name of Substance 3: Marijuana  3 - Age of First Use: 20+ years  3 - Amount (size/oz):  unspecified  3 - Frequency: daily  3 - Duration: unspecified  3 - Last Use / Amount: this AM   CIWA: CIWA-Ar BP: 113/83 mmHg Pulse Rate: 79 COWS:    Allergies:  Allergies  Allergen Reactions  . Clonidine Derivatives Other (See Comments)    Muscle spasms.  . Penicillins Rash    Has patient had a PCN reaction causing immediate rash, facial/tongue/throat swelling, SOB or lightheadedness with hypotension: Yesyes Has patient had a PCN reaction causing severe rash involving mucus membranes or skin necrosis: Nono Has patient had a PCN reaction that required hospitalization Nono Has patient had a PCN reaction occurring within the last 10 years: [name] If all of the above answers are "NO", then may proceed with Cephalospori    Home Medications:  (Not in a hospital admission)  OB/GYN Status:  No LMP for male patient.  General Assessment Data Location of Assessment: [facility] TTS Assessment: In system Is this a Tele or Face-to-Face Assessment?: Face-to-Face Is this an Initial Assessment or a Re-assessment for this encounter?: Initial Assessment Marital status: Single Is patient pregnant?: No Pregnancy Status: No Living Arrangements:  (Shelter [facility] [employer])) Can pt return to current living arrangement?: Yes Admission Status: Voluntary Is patient capable of signing voluntary admission?: Yes Referral Source: Self/Family/Friend Insurance type: none     Crisis Care Plan Living Arrangements:  ([facility]helter (  [facility] of Psychiatrist: [facility] Name of Therapist: [facility]   Education Status Is patient currently in school?: No  Risk to self with the past 6 months Suicidal Ideation: Yes-Currently Present Has patient been a risk to self within the past 6 months prior to admission? : No Suicidal Intent: Yes-Currently Present Has patient had any suicidal intent within the past 6 months prior to admission? : Yes Is patient at risk for suicide?: No Suicidal  Plan?: Yes-Currently Present Has patient had any suicidal plan within the past 6 months prior to admission? : Yes Specify Current Suicidal Plan: overdose on heroin Access to Means: Yes Specify Access to Suicidal Means: pt has access to heroin What has been your use of drugs/alcohol within the last 12 months?: see above Previous Attempts/Gestures: Yes How many times?:  (unknown) Other Self Harm Risks: drug use Triggers for Past Attempts: None known Intentional Self Injurious Behavior: None Family Suicide History: Unknown Persecutory voices/beliefs?: No Depression: Yes Depression Symptoms: Feeling angry/irritable Substance abuse history and/or treatment for substance abuse?: Yes Suicide prevention information given to non-admitted patients: Not applicable  Risk to Others within the past 6 months Homicidal Ideation: No Does patient have any lifetime risk of violence toward others beyond the six months prior to admission? : No Thoughts of Harm to Others: No Current Homicidal Intent: No Current Homicidal Plan: No Access to Homicidal Means: No History of harm to others?: No Assessment of Violence: None Noted Does patient have access to weapons?: No Criminal Charges Pending?: No Does patient have a court date: No Is patient on probation?: No  Psychosis Hallucinations: Auditory Delusions: None noted  Mental Status Report Appearance/Hygiene: Unremarkable Eye Contact: Good Motor Activity: Unremarkable Speech: Logical/coherent Level of Consciousness: Irritable Mood: Irritable Affect: Irritable Anxiety Level: None Thought Processes: Coherent, Relevant Judgement: Partial Orientation: Person, Place, Time, Situation Obsessive Compulsive Thoughts/Behaviors: None  Cognitive Functioning Concentration: Normal Memory: Unable to Assess IQ: Average Insight: see judgement above Impulse Control: Unable to Assess Appetite: Fair Sleep: No Change Vegetative Symptoms:  None  ADLScreening [facility] Assessment Services) Patient's cognitive ability adequate to safely complete daily activities?: Yes Patient able to express need for assistance with ADLs?: Yes Independently performs ADLs?: Yes (appropriate for developmental age)  Prior Inpatient Therapy Prior Inpatient Therapy: Yes Prior Therapy Dates: 2010; 2017 [facility] Reason for Treatment: depression; SI; drug abuse  Prior Outpatient Therapy Prior Outpatient Therapy: No Does patient have an ACCT team?: No Does patient have Intensive In-House Services?  : No Does patient have [facility] services? : Yes Does patient have P4CC services?: No  ADL Screening (condition at time of admission) Patient's cognitive ability adequate to safely complete daily activities?: Yes Is the patient deaf or have difficulty hearing?: No Does the patient have difficulty seeing, even when wearing glasses/contacts?: No Does the patient have difficulty concentrating, remembering, or making decisions?: No Patient able to express need for assistance with ADLs?: Yes Does the patient have difficulty dressing or bathing?: No Independently performs ADLs?: Yes (appropriate for developmental age) Does the patient have difficulty walking or climbing stairs?: No Weakness of Legs: None Weakness of Arms/Hands: None  Home Assistive Devices/Equipment Home Assistive Devices/Equipment: None  Therapy Consults (therapy consults require a physician order) PT Evaluation Needed: No OT Evalulation Needed: No SLP Evaluation Needed: No Abuse/Neglect Assessment (Assessment to be complete while patient is alone) Physical Abuse: Denies Verbal Abuse: Denies Sexual Abuse: Denies Exploitation of patient/patient's resources: Denies Self-Neglect: Denies Values / Beliefs Cultural Requests During Hospitalization:  None Spiritual Requests During Hospitalization: None Consults Spiritual Care Consult Needed: No Social Work  Consult Needed: No Merchant navy officerAdvance Directives (For Healthcare) Does patient have an advance directive?: No Would patient like information on creating an advanced directive?: No - patient declined information Nutrition Screen- [facility] Patient's home diet: Regular  Additional Information 1:1 In Past 12 Months?: No CIRT Risk: No Elopement Risk: No Does patient have medical clearance?: Yes     Disposition:  Disposition Initial Assessment Completed for this Encounter: Yes Disposition of Patient: Other dispositions (consulted to Nanine MeansJamison Lord, DNP) Other disposition(s): Other (Comment) (Pt appropriate for Observation Unit for rehab referrals)  On Site Evaluation by:   Reviewed with Physician:    Laddie AquasSamantha M Kennley Schwandt 02/29/2016 1:32 PM

## 2016-02-29 NOTE — ED Notes (Signed)
Patient and belongings have been wanded by security. Patient has one patient belonging bag with clothing and one black backpack with personal items.

## 2016-02-29 NOTE — ED Notes (Signed)
Pt sleeping at present, no distress noted.  Easily arouseable to verbal stimuli.  Monitoring for safety, Q 15 min checks in effect.

## 2016-03-01 DIAGNOSIS — F1994 Other psychoactive substance use, unspecified with psychoactive substance-induced mood disorder: Secondary | ICD-10-CM

## 2016-03-01 DIAGNOSIS — F11220 Opioid dependence with intoxication, uncomplicated: Secondary | ICD-10-CM

## 2016-03-01 DIAGNOSIS — R45851 Suicidal ideations: Secondary | ICD-10-CM

## 2016-03-01 NOTE — ED Notes (Signed)
After leaving angrily with security and police escort, and refusing discharge instructions, pt told security in the ED waiting room that he does want his discharge instructions. This Clinical research associatewriter took his discharge instructions with list of resources for substance dependence, and list of homeless shelters, and a bus pass. He continued to be demanding and belligerent with this Clinical research associatewriter. He snatched the bus pass from my hand and put it in his bag with no thanks or other comment. He was more interested in the dates that appeared on his discharge instructions, than the resources recommended. This writer advised him of his right to get a complete copy of his medical record from the medical records department.

## 2016-03-01 NOTE — BH Assessment (Signed)
[facility] Assessment Progress Note  Per Thedore MinsMojeed Akintayo, MD, this pt does not require psychiatric hospitalization at this time.  [facility].  Discharge instructions advise pt to follow up with [facility], [facility], [facility], and [facility].  Pt's nurse, Diane, has been notified.  Ian Canninghomas Gilles Trimpe, MA Triage Specialist [phone]

## 2016-03-01 NOTE — ED Notes (Signed)
Pt discharged by Dr. Jannifer FranklinAkintayo. He was belligerent with the doctor and the NP when they tried to discuss his responsibility for drug use with him. He became angry and threatened the doctors and the staff. Pt made threats to kill himself so that his family can sue the hospital. He refused discharge instructions as he left in an angry state of mind.  Prior the interview with the doctors pt had been sleeping in bed with no complaints voiced. He ate breakfast. He did not report any withdrawal symptoms and did not appear to be in distress. Security and police escorted pt the the exit.

## 2016-03-01 NOTE — Discharge Instructions (Signed)
To help you maintain a sober lifestyle, a substance abuse treatment program may be beneficial to you.  Contact the following providers to ask about enrolling in their program: ° °RESIDENTIAL PROGRAMS: ° °     [facility] °     [address] °     [place], [place] [address] °     [phone] ° °     [facility] °     [address] °     [place], [place] [address] °     [phone] ° °     [facility] °     [address] °     [place], [place] [address] °     [phone] ° °OUTPATIENT PROGRAMS: ° °     [facility] °     [address] °     [place], [place] [address] °     [phone] °     New patients are seen at the walk-in clinic every Tuesday from 9:00 am - 12:00 pm. °

## 2016-03-01 NOTE — Consult Note (Signed)
[place] [facility] Consult   Reason for Consult:  Suicidal Ideation  Referring Physician:  EDP  Patient Identification: Jerik Falletta MRN:  [id number] Principal Diagnosis: Polysubstance dependence including opioid type drug without complication, episodic abuse [facility]) Diagnosis:   Patient Active Problem List   Diagnosis Date Noted  . Polysubstance dependence including opioid type drug without complication, episodic abuse [facility]) [F11.220] 02/21/2016    Priority: High  . Substance induced mood disorder ([place]) [F19.94] 02/20/2016    Priority: High  . Cocaine abuse [F14.10] 02/11/2016  . 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 Acute pancreatitis [K85.90] 08/12/2012  . UTI (lower urinary tract infection) [N39.0] 09/20/2011  . ARF (acute renal failure) ([place]) [N17.9] 09/19/2011  . Abdominal pain, acute, bilateral lower quadrant [R10.31, R10.32] 09/19/2011  . Leukocytosis [D72.829] 09/19/2011  . Suicidal ideation [R45.851] 09/19/2011  . Bipolar disorder (manic depression) ([place]) [F31.9] 09/19/2011  . Ascites [789.5] 09/19/2011  . Ulcerative colitis [556] 09/19/2011  . Meniere disease [H81.09] 09/19/2011  . Polysubstance abuse [F19.10] 09/19/2011  . 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 male patient presented to [facility] with complaints of suicidal ideation and a relapse on numerous substances. Pt seen and chart reviewed. Pt is alert/oriented x4, agitated, uncooperative. Pt denies psychosis and does not appear to be responding to internal stimuli. Pt was extremely agitated which is identical to his presentation on June 16th and June 24th. During those visits, he continued to curse and yell at staff members and threatened to harm himself and others when he was given answers or treatment options that were not what he wanted. Today, pt originally did not endorse suicidal/homicidal  ideation, yet when we told him how he could use outside resources for treatment, he suddenly became angry and shouted and cursed at [place], stating he would harm [place] or himself. Pt has a history of becoming angry and making these statements yet without self-injurious behaviors or gestures witnessed. Pt began to walk toward the treatment team in an aggressive manner while shouting and cursing and had to be escorted back to his room by security. Pt was informed that we will continue the original outpatient treatment plan and that his anger and profanity with aggressive behavior will not change our professional judgment. Pt was escorted out by security.   HPI:  I have reviewed and concur with HPI elements as modified below  Leldon Steege is a 45 y.o. male who presents voluntarily to [facility] due to [facility] w/ a plan to OD on heroin. Pt also reports having AH telling him " it would be nice to end it all". Pt was just released from Observation Unit a week ago with similar complaints. Pt admits to being non-compliant with his psychotropic meds. Pt admits to a drug addiction and indicates that he needs rehab. Pt reports last receiving drug rehab at [facility] over 5 years ago and indicates that it was helpful.   Past Psychiatric History: Multiple inpatient hospitalization related to substance abuse.  Depression  Risk to Self: Suicidal Ideation: Yes-Currently Present Suicidal Intent: Yes-Currently Present Is patient at risk for suicide?: No Suicidal Plan?: Yes-Currently Present Specify Current Suicidal Plan: overdose on heroin Access to Means: Yes Specify Access to Suicidal Means: pt has access to heroin What has been your use of drugs/alcohol within the last 12 months?: see above How many times?:  (unknown) Other Self Harm Risks: drug use Triggers for Past  Attempts: None known Intentional Self Injurious Behavior: None Risk to Others: Homicidal Ideation: No Thoughts of Harm to Others: No Current Homicidal Intent:  No Current Homicidal Plan: No Access to Homicidal Means: No History of harm to others?: No Assessment of Violence: None Noted Does patient have access to weapons?: No Criminal Charges Pending?: No Does patient have a court date: No Prior Inpatient Therapy: Prior Inpatient Therapy: Yes Prior Therapy Dates: 2010; 2017 Prior Therapy Facilty/Provider(s): [facility] Reason for Treatment: depression; SI; drug abuse Prior Outpatient Therapy: Prior Outpatient Therapy: No Does patient have an ACCT team?: No Does patient have Intensive In-House Services?  : No Does patient have [facility] services? : Yes Does patient have P4CC services?: No  Past Medical History:  Past Medical History  Diagnosis Date  . Ulcerative colitis   . Meniere disease   . Substance abuse     Past Surgical History  Procedure Laterality Date  . Fracture surgery     Family History:  Family History  Problem Relation Age of Onset  . COPD Father   . Heart attack Father    Family Psychiatric  History: Denies  Social History:  History  Alcohol Use No    Comment: 1 month substance free     History  Drug Use No    Comment: last used yesterday    Social History   Social History  . Marital Status: Divorced    Spouse Name: N/A  . Number of Children: N/A  . Years of Education: N/A   Social History Main Topics  . Smoking status: Current Every Day Smoker -- 0.50 packs/day for 20 years    Types: Cigarettes    Last Attempt to Quit: 08/14/2001  . Smokeless tobacco: Never Used  . Alcohol Use: No     Comment: 1 month substance free  . Drug Use: No     Comment: last used yesterday  . Sexual Activity: Yes   Other Topics Concern  . None   Social History Narrative   Additional Social History:    Allergies:   Allergies  Allergen Reactions  . Clonidine Derivatives Other (See Comments)    Muscle spasms.  . Penicillins Rash    Has patient had a PCN reaction causing immediate rash, facial/tongue/throat swelling,  SOB or lightheadedness with hypotension: Yesyes Has patient had a PCN reaction causing severe rash involving mucus membranes or skin necrosis: Yesno Has patient had a PCN reaction that required hospitalization Nono Has patient had a PCN reaction occurring within the last 10 years: [place] If all of the above answers are "NO", then may proceed with Cephalospori    Labs:  Results for orders placed or performed during the hospital encounter of 02/29/16 (from the past 48 hour(s))  Comprehensive metabolic panel     Status: None   Collection Time: 02/29/16 12:00 PM  Result Value Ref Range   Sodium 137 135 - 145 mmol/L   Potassium 3.5 3.5 - 5.1 mmol/L   Chloride 101 101 - 111 mmol/L   CO2 27 22 - 32 mmol/L   Glucose, Bld 99 65 - 99 mg/dL   BUN 14 6 - 20 mg/dL   Creatinine, Ser 1.07 0.61 - 1.24 mg/dL   Calcium 9.0 8.9 - 10.3 mg/dL   Total Protein 7.1 6.5 - 8.1 g/dL   Albumin 4.1 3.5 - 5.0 g/dL   AST 20 15 - 41 U/L   ALT 36 17 - 63 U/L   Alkaline Phosphatase 49 38 - 126 U/L  Total Bilirubin 0.7 0.3 - 1.2 mg/dL   GFR calc non Af Amer >60 >60 mL/min   GFR calc Af Amer >60 >60 mL/min    Comment: (NOTE) The eGFR has been calculated using the CKD EPI equation. This calculation has not been validated in all clinical situations. eGFR's persistently <60 mL/min signify possible Chronic Kidney Disease.    Anion gap 9 5 - 15  Ethanol     Status: None   Collection Time: 02/29/16 12:00 PM  Result Value Ref Range   Alcohol, Ethyl (B) <5 <5 mg/dL    Comment:        LOWEST DETECTABLE LIMIT FOR SERUM ALCOHOL IS 5 mg/dL FOR MEDICAL PURPOSES ONLY   cbc     Status: Abnormal   Collection Time: 02/29/16 12:00 PM  Result Value Ref Range   WBC 10.8 (H) 4.0 - 10.5 K/uL   RBC 4.48 4.22 - 5.81 MIL/uL   Hemoglobin 13.5 13.0 - 17.0 g/dL   HCT 40.3 39.0 - 52.0 %   MCV 90.0 78.0 - 100.0 fL   MCH 30.1 26.0 - 34.0 pg   MCHC 33.5 30.0 - 36.0 g/dL   RDW 13.9 11.5 - 15.5 %   Platelets 308 505 - 697 K/uL   Salicylate level     Status: None   Collection Time: 02/29/16 12:00 PM  Result Value Ref Range   Salicylate Lvl <9.4 2.8 - 30.0 mg/dL  Acetaminophen level     Status: Abnormal   Collection Time: 02/29/16 12:00 PM  Result Value Ref Range   Acetaminophen (Tylenol), Serum <10 (L) 10 - 30 ug/mL    Comment:        THERAPEUTIC CONCENTRATIONS VARY SIGNIFICANTLY. A RANGE OF 10-30 ug/mL MAY BE AN EFFECTIVE CONCENTRATION FOR MANY PATIENTS. HOWEVER, SOME ARE BEST TREATED AT CONCENTRATIONS OUTSIDE THIS RANGE. ACETAMINOPHEN CONCENTRATIONS >150 ug/mL AT 4 HOURS AFTER INGESTION AND >50 ug/mL AT 12 HOURS AFTER INGESTION ARE OFTEN ASSOCIATED WITH TOXIC REACTIONS.   Rapid urine drug screen (hospital performed)     Status: Abnormal   Collection Time: 02/29/16 12:35 PM  Result Value Ref Range   Opiates POSITIVE (A) NONE DETECTED   Cocaine POSITIVE (A) NONE DETECTED   Benzodiazepines POSITIVE (A) NONE DETECTED   Amphetamines NONE DETECTED NONE DETECTED   Tetrahydrocannabinol POSITIVE (A) NONE DETECTED   Barbiturates NONE DETECTED NONE DETECTED    Comment:        DRUG SCREEN FOR MEDICAL PURPOSES ONLY.  IF CONFIRMATION IS NEEDED FOR ANY PURPOSE, NOTIFY LAB WITHIN 5 DAYS.        LOWEST DETECTABLE LIMITS FOR URINE DRUG SCREEN Drug Class       Cutoff (ng/mL) Amphetamine      1000 Barbiturate      200 Benzodiazepine   801 Tricyclics       655 Opiates          300 Cocaine          300 THC              50     Current Facility-Administered Medications  Medication Dose Route Frequency Provider Last Rate Last Dose  . citalopram (CELEXA) tablet 10 mg  10 mg Oral Daily Nicole Pisciotta, PA-C   10 mg at 02/29/16 1357  . gabapentin (NEURONTIN) capsule 200 mg  200 mg Oral BID Nicole Pisciotta, PA-C   200 mg at 02/29/16 2148  . ibuprofen (ADVIL,MOTRIN) tablet 600 mg  600 mg Oral Q8H PRN Monico Blitz,  PA-C      . nicotine (NICODERM CQ - dosed in mg/24 hours) patch 21 mg  21 mg Transdermal  Daily Nicole Pisciotta, PA-C   21 mg at 02/29/16 1357  . ondansetron (ZOFRAN) tablet 4 mg  4 mg Oral Q8H PRN Nicole Pisciotta, PA-C      . zolpidem (AMBIEN) tablet 5 mg  5 mg Oral QHS PRN Monico Blitz, PA-C       Current Outpatient Prescriptions  Medication Sig Dispense Refill  . citalopram (CELEXA) 10 MG tablet Take 1 tablet (10 mg total) by mouth daily. 30 tablet 0  . gabapentin (NEURONTIN) 100 MG capsule Take 2 capsules (200 mg total) by mouth 2 (two) times daily. 120 capsule 0  . hydrOXYzine (ATARAX/VISTARIL) 50 MG tablet Take 1 tablet (50 mg total) by mouth every 6 (six) hours as needed for anxiety. 30 tablet 0  . naproxen (NAPROSYN) 500 MG tablet Take 1 tablet (500 mg total) by mouth 2 (two) times daily. 30 tablet 0   Facility-Administered Medications Ordered in Other Encounters  Medication Dose Route Frequency Provider Last Rate Last Dose  . acetaminophen (TYLENOL) tablet 650 mg  650 mg Oral Q6H PRN Encarnacion Slates, NP      . alum & mag hydroxide-simeth (MAALOX/MYLANTA) 200-200-20 MG/5ML suspension 30 mL  30 mL Oral Q4H PRN Encarnacion Slates, NP      . LORazepam (ATIVAN) tablet 0.5 mg  0.5 mg Oral Q6H PRN Encarnacion Slates, NP      . magnesium hydroxide (MILK OF MAGNESIA) suspension 30 mL  30 mL Oral Daily PRN Encarnacion Slates, NP        Musculoskeletal: Strength & Muscle Tone: within normal limits Gait & Station: normal Patient leans: N/A  Psychiatric Specialty Exam: Physical Exam  Constitutional: He is oriented to person, place, and time.  Neck: Normal range of motion.  Respiratory: Effort normal.  Musculoskeletal: Normal range of motion.  Neurological: He is alert and oriented to person, place, and time.    Review of Systems  Psychiatric/Behavioral: Positive for depression, suicidal ideas (When pt does not receive treatment he requests, he will make threatening statements ot harm self or others. this is baseline and in chart notes) and substance abuse. Negative for hallucinations.   All other systems reviewed and are negative.   Blood pressure 137/86, pulse 85, temperature 98.2 F (36.8 C), temperature source Oral, resp. rate 18, SpO2 99 %.There is no weight on file to calculate BMI.  General Appearance: Casual but fairly groomed  Eye Contact:  Minimal yet improving  Speech:  Clear and Coherent  Volume:  Increased    Mood:  Anxious, Depressed and Irritable and agitated  Affect:  Depressed   Thought Process:  Coherent  Orientation:  Full (Time, Place, and Person)  Thought Content:  Denies hallucinations, delusions, and paranoai  Suicidal Thoughts:  Yes, without intent/plan, chronic when told he will not receive the treatment he wants  Homicidal Thoughts:  No  Memory:  Immediate;   Fair Recent;   Fair Remote;   Fair  Judgement:  Fair  Insight:  Lacking and Shallow  Psychomotor Activity:  Normal  Concentration:  Concentration: Fair and Attention Span: Fair  Recall:  [employer] of Knowledge:  Fair  Language:  Fair  Akathisia:  No  Handed:  Right  AIMS (if indicated):     Assets:  Communication Skills Desire for Improvement  ADL's:  Intact  Cognition:  WNL  Sleep:  Treatment Plan Summary: Polysubstance dependence including opioid type drug without complication, episodic abuse ([place]), stable for outpatient treatment  Medications:  -Continue Celexa 16m po daily for MDD -Continue Neurontin 1054mpo bid for mood stabilization -Continue Vistaril 5021mo q6h prn anxiety  Disposition:  -Discharge home with outpatient resources  WitBenjamine MolaNP 03/01/2016 9:45 AM  Patient seen face-to-face for psychiatric evaluation, chart reviewed and case discussed with the physician extender and developed treatment plan. Reviewed the information documented and agree with the treatment plan. MojCorena PilgrimD

## 2017-03-21 ENCOUNTER — Inpatient Hospital Stay
Admit: 2017-03-21 | Discharge: 2017-03-21 | Disposition: A | Discharge status: Home / Self Care | Payer: Self-pay | Attending: Emergency Medicine

## 2017-03-21 ENCOUNTER — Emergency Department: Admit: 2017-03-21 | Payer: Self-pay

## 2017-03-21 DIAGNOSIS — R1084 Generalized abdominal pain: Secondary | ICD-10-CM

## 2017-03-21 LAB — CBC WITH AUTOMATED DIFF
ABS. BASOPHILS: 0 10*3/uL (ref 0.0–0.2)
ABS. EOSINOPHILS: 0.2 10*3/uL (ref 0.0–0.8)
ABS. IMM. GRANS.: 0 10*3/uL (ref 0.0–0.5)
ABS. LYMPHOCYTES: 1.7 10*3/uL (ref 0.5–4.6)
ABS. MONOCYTES: 0.6 10*3/uL (ref 0.1–1.3)
ABS. NEUTROPHILS: 5 10*3/uL (ref 1.7–8.2)
BASOPHILS: 0 % (ref 0.0–2.0)
EOSINOPHILS: 3 % (ref 0.5–7.8)
HCT: 44.4 % (ref 41.1–50.3)
HGB: 15.2 g/dL (ref 13.6–17.2)
IMMATURE GRANULOCYTES: 0 % (ref 0.0–5.0)
LYMPHOCYTES: 22 % (ref 13–44)
MCH: 30.6 PG (ref 26.1–32.9)
MCHC: 34.2 g/dL (ref 31.4–35.0)
MCV: 89.5 FL (ref 79.6–97.8)
MONOCYTES: 7 % (ref 4.0–12.0)
MPV: 10 FL — ABNORMAL LOW (ref 10.8–14.1)
NEUTROPHILS: 68 % (ref 43–78)
PLATELET: 291 10*3/uL (ref 150–450)
RBC: 4.96 M/uL (ref 4.23–5.67)
RDW: 13.2 % (ref 11.9–14.6)
WBC: 7.5 10*3/uL (ref 4.3–11.1)

## 2017-03-21 LAB — METABOLIC PANEL, COMPREHENSIVE
A-G Ratio: 0.9 — ABNORMAL LOW (ref 1.2–3.5)
ALT (SGPT): 20 U/L (ref 12–65)
AST (SGOT): 11 U/L — ABNORMAL LOW (ref 15–37)
Albumin: 3.6 g/dL (ref 3.5–5.0)
Alk. phosphatase: 71 U/L (ref 50–136)
Anion gap: 6 mmol/L — ABNORMAL LOW (ref 7–16)
BUN: 15 MG/DL (ref 6–23)
Bilirubin, total: 0.4 MG/DL (ref 0.2–1.1)
CO2: 27 mmol/L (ref 21–32)
Calcium: 8.7 MG/DL (ref 8.3–10.4)
Chloride: 106 mmol/L (ref 98–107)
Creatinine: 0.95 MG/DL (ref 0.8–1.5)
GFR est AA: >60 mL/min/{1.73_m2} (ref >60)
GFR est non-AA: >60 mL/min/{1.73_m2} (ref >60)
Globulin: 4.2 g/dL — ABNORMAL HIGH (ref 2.3–3.5)
Glucose: 92 mg/dL (ref 65–100)
Potassium: 4.1 mmol/L (ref 3.5–5.1)
Protein, total: 7.8 g/dL (ref 6.3–8.2)
Sodium: 139 mmol/L (ref 136–145)

## 2017-03-21 LAB — LIPASE: Lipase: 182 U/L (ref 73–393)

## 2017-03-21 MED ORDER — ONDANSETRON 4 MG TAB, RAPID DISSOLVE
4 mg | ORAL | Status: AC
Start: 2017-03-21 — End: 2017-03-21
  Administered 2017-03-21: 15:00:00 via ORAL

## 2017-03-21 MED ORDER — ONDANSETRON 4 MG TAB, RAPID DISSOLVE
4 mg | ORAL | Status: AC
Start: 2017-03-21 — End: 2017-03-21
  Administered 2017-03-21: 18:00:00 via ORAL

## 2017-03-21 MED ORDER — IOPAMIDOL 76 % IV SOLN
370 mg iodine /mL (76 %) | Freq: Once | INTRAVENOUS | Status: AC
Start: 2017-03-21 — End: 2017-03-21
  Administered 2017-03-21: 16:00:00 via INTRAVENOUS

## 2017-03-21 MED ORDER — SODIUM CHLORIDE 0.9% BOLUS IV
0.9 % | Freq: Once | INTRAVENOUS | Status: AC
Start: 2017-03-21 — End: 2017-03-21
  Administered 2017-03-21: 16:00:00 via INTRAVENOUS

## 2017-03-21 MED ORDER — MORPHINE 2 MG/ML INJECTION
2 mg/mL | INTRAMUSCULAR | Status: AC
Start: 2017-03-21 — End: 2017-03-21
  Administered 2017-03-21: 15:00:00 via INTRAVENOUS

## 2017-03-21 MED ORDER — ONDANSETRON 4 MG TAB, RAPID DISSOLVE
4 mg | ORAL_TABLET | Freq: Three times a day (TID) | ORAL | 1 refills | Status: DC | PRN
Start: 2017-03-21 — End: 2017-04-19

## 2017-03-21 MED ORDER — OXYCODONE 5 MG TAB
5 mg | ORAL | Status: AC
Start: 2017-03-21 — End: 2017-03-21
  Administered 2017-03-21: 18:00:00 via ORAL

## 2017-03-21 MED ORDER — SALINE PERIPHERAL FLUSH PRN
Freq: Once | INTRAMUSCULAR | Status: AC
Start: 2017-03-21 — End: 2017-03-21
  Administered 2017-03-21: 16:00:00

## 2017-03-21 MED ORDER — SODIUM CHLORIDE 0.9 % IV
Freq: Once | INTRAVENOUS | Status: AC
Start: 2017-03-21 — End: 2017-03-21
  Administered 2017-03-21: 15:00:00 via INTRAVENOUS

## 2017-03-21 MED ORDER — DICYCLOMINE 10 MG CAP
10 mg | ORAL_CAPSULE | Freq: Three times a day (TID) | ORAL | 0 refills | Status: AC | PRN
Start: 2017-03-21 — End: 2017-03-26

## 2017-03-21 MED FILL — ONDANSETRON 4 MG TAB, RAPID DISSOLVE: 4 mg | ORAL | Qty: 1

## 2017-03-21 MED FILL — OXYCODONE 5 MG TAB: 5 mg | ORAL | Qty: 2

## 2017-03-21 MED FILL — MORPHINE 2 MG/ML INJECTION: 2 mg/mL | INTRAMUSCULAR | Qty: 2

## 2017-03-21 NOTE — ED Notes (Signed)
I have reviewed discharge instructions with the patient.  The patient verbalized understanding.    Patient left ED via Discharge Method: ambulatory to Home with (self).    Opportunity for questions and clarification provided.       Patient given 2 scripts. noesign        To continue your aftercare when you leave the hospital, you may receive an automated call from our care team to check in on how you are doing.  This is a free service and part of our promise to provide the best care and service to meet your aftercare needs.??? If you have questions, or wish to unsubscribe from this service please call [phone].  [facility] you [facility].

## 2017-03-21 NOTE — ED Provider Notes (Signed)
[facility] Emergency Department  Seen  @ [facility] EMERGENCY DEPT in room ER17/17    Chief Complaint   Patient presents with   ??? Abdominal Pain       HPI:   Lee Evans is a 46 y.o. male who complaints of RLQ pain for several days. No alleviating  + anorexia  Hx of ulcerative colitis -- not on meds  Some blood in stool yesterday    Increased with movement/activity  Better with laying with knees up    Historian: patient    Review of Systems:    CONST:  chills  ENT: Denies: sore throat, earache, nasal congestion,   EYES: Denies: vision changes, double vision, discharge,   CARDIO: Denies: chest pain, palpitations,    RESP: Denies: cough, shortness of breath, dyspnea on exertion,   GI: Per hpi  GU: Denies: dysuria, hematuria, urinary frequency,   MUSC: Denies: muscle aches, joint pain,   SKIN: Denies: hives, rash,   PSYCH: Denies: anxiety, depression,   ENDO: Denies: polyuria, polydipsia,   Heme/Lymph:      Denies: easy bruising, bleeding,   NEURO: Denies: headache, confusion, change in behavior, extremity,weakness, paresthesias,     Past Medical History:  Primary Care Doctor: None    Past Medical History:   Diagnosis Date   ??? Gastrointestinal disorder        History reviewed. No pertinent surgical history.     Social History     Social History   ??? Marital status: DIVORCED     Spouse name: N/A   ??? Number of children: N/A   ??? Years of education: N/A     Social History Main Topics   ??? Smoking status: Current Every Day Smoker     Packs/day: 0.50   ??? Smokeless tobacco: Never Used   ??? Alcohol use No   ??? Drug use: No   ??? Sexual activity: Yes     Other Topics Concern   ??? None     Social History Narrative   ??? None       Previous Medications    No medications on file       Allergies   Allergen Reactions   ??? Pcn [Penicillins] Rash       Physical Exam:    Vitals:    03/21/17 0830   BP: (!) 148/94   Pulse: 68   Resp: 20   Temp: 97.9 ??F (36.6 ??C)   SpO2: 97%     Vital signs were reviewed.   Pulse oximetry interpretation: normal    Constitutional: Uncomfortable, afebrile  Head: Atraumatic, normo-cephalic,   Ears/Nose/Throat: throat clear, mucous membranes moist,   Eyes: PERRL, EOMI, anicteric,   Neck: supple, FROM, no lymphadenopathy, no meningismus,   Cardiovascular: regular rate and rhythm, no murmur, 2+ radial pulses,    Respiratory: clear to auscultation with no wheezes, rales, ronchi,   Back:  FROM, no CVA tenderness,   Abdomen: Test palpation in the right lower quadrant without rebound.  There is percussion tenderness  Musculoskeletal: no deformities, edema,   Skin: dry, no rashes,   Neurologic: alert, oriented, answers questions follows commands,   Psychiatric: Speech pattern and content normal,      _____________________________________________________________________  Medical Decision Making:    Differential Diagnosis for this patient includes: 46 year old male with right lower quadrant pain could be appendicitis diverticulitis a Meckel's diverticulum renal stone also colitis or Crohn's disease    This is a new problem that does need additional  workup  Labs/Radiographs/ECG were ordered: yes  Old records were reviewed: yes  CT/US/XRay/MRI were visualized by me: yes    The patient's problem is: aacute, located  The Diagnostic Options are: low risk  The Management Options are: high risk    ______________________________________________________________________  ED Evaluation    Labs:   Recent Results (from the past 8 hour(s))   CBC WITH AUTOMATED DIFF    Collection Time: 03/21/17  9:55 AM   Result Value Ref Range    WBC 7.5 4.3 - 11.1 K/uL    RBC 4.96 4.23 - 5.67 M/uL    HGB 15.2 13.6 - 17.2 g/dL    HCT 44.4 41.1 - 50.3 %    MCV 89.5 79.6 - 97.8 FL    MCH 30.6 26.1 - 32.9 PG    MCHC 34.2 31.4 - 35.0 g/dL    RDW 13.2 11.9 - 14.6 %    PLATELET 291 150 - 450 K/uL    MPV 10.0 (L) 10.8 - 14.1 FL    DF AUTOMATED      NEUTROPHILS 68 43 - 78 %    LYMPHOCYTES 22 13 - 44 %    MONOCYTES 7 4.0 - 12.0 %     EOSINOPHILS 3 0.5 - 7.8 %    BASOPHILS 0 0.0 - 2.0 %    IMMATURE GRANULOCYTES 0 0.0 - 5.0 %    ABS. NEUTROPHILS 5.0 1.7 - 8.2 K/UL    ABS. LYMPHOCYTES 1.7 0.5 - 4.6 K/UL    ABS. MONOCYTES 0.6 0.1 - 1.3 K/UL    ABS. EOSINOPHILS 0.2 0.0 - 0.8 K/UL    ABS. BASOPHILS 0.0 0.0 - 0.2 K/UL    ABS. IMM. GRANS. 0.0 0.0 - 0.5 K/UL   METABOLIC PANEL, COMPREHENSIVE    Collection Time: 03/21/17  9:55 AM   Result Value Ref Range    Sodium 139 136 - 145 mmol/L    Potassium 4.1 3.5 - 5.1 mmol/L    Chloride 106 98 - 107 mmol/L    CO2 27 21 - 32 mmol/L    Anion gap 6 (L) 7 - 16 mmol/L    Glucose 92 65 - 100 mg/dL    BUN 15 6 - 23 MG/DL    Creatinine 0.95 0.8 - 1.5 MG/DL    GFR est AA >60 >60 ml/min/1.38m    GFR est non-AA >60 >60 ml/min/1.75m   Calcium 8.7 8.3 - 10.4 MG/DL    Bilirubin, total 0.4 0.2 - 1.1 MG/DL    ALT (SGPT) 20 12 - 65 U/L    AST (SGOT) 11 (L) 15 - 37 U/L    Alk. phosphatase 71 50 - 136 U/L    Protein, total 7.8 6.3 - 8.2 g/dL    Albumin 3.6 3.5 - 5.0 g/dL    Globulin 4.2 (H) 2.3 - 3.5 g/dL    A-G Ratio 0.9 (L) 1.2 - 3.5         Labs were reviewed and interpreted by me.    Radiology studies performed:   CT ABD PELV W CONT   Final Result   IMPRESSION:  No acute CT findings in the abdomen or pelvis with normal appendix   identified.                 Radiographs were visualized by me      Current Facility-Administered Medications   Medication Dose Route Frequency   ??? oxyCODONE IR (ROXICODONE) tablet 10 mg  10 mg Oral NOW   ???  ondansetron (ZOFRAN ODT) tablet 4 mg  4 mg Oral NOW     No current outpatient prescriptions on file.       ==================================================================  ASSESSMENT: patient resting.  Continues to complain of abdominal pain.  White count is normal.  No fever.  No vomiting.  No diarrhea or graft CT is unremarkable.  There is no acute changes noted.  No findings suggestive of colitis appendicitis infection or other intra-abdominal process     In the light of a normal white count and normal CT I don't think further investigations are warranted.  We will give additional pain medicine here in the emergency department and discharged home.  Antispasmodics.  Something for constipation.  _____________________________________________________________________    Condition:  fair  Disposition:  home  Diagnosis:  Abdominal pain of uncertain etiology    Mamie Nick, MD; 03/21/2017 '@9' :47 AM===========================================    ED Course

## 2017-03-21 NOTE — ED Triage Notes (Signed)
Pt brought in via EMS for RLQ pain for several days. Some diarrhea, denies N/V. Hx colitis.

## 2017-03-21 NOTE — Progress Notes (Signed)
Patient refused blood work in triage

## 2017-04-19 ENCOUNTER — Emergency Department: Admit: 2017-04-19 | Payer: Self-pay

## 2017-04-19 ENCOUNTER — Inpatient Hospital Stay
Admit: 2017-04-19 | Discharge: 2017-04-20 | Disposition: A | Discharge status: Home / Self Care | Payer: Self-pay | Attending: Emergency Medicine

## 2017-04-19 DIAGNOSIS — R079 Chest pain, unspecified: Secondary | ICD-10-CM

## 2017-04-19 LAB — CBC WITH AUTOMATED DIFF
ABS. BASOPHILS: 0 10*3/uL
ABS. EOSINOPHILS: 0.1 10*3/uL
ABS. IMM. GRANS.: 0 10*3/uL
ABS. LYMPHOCYTES: 1.4 10*3/uL
ABS. MONOCYTES: 0.7 10*3/uL
ABS. NEUTROPHILS: 6.7 10*3/uL
ABSOLUTE NRBC: 0 10*3/uL (ref 0.0–0.2)
BASOPHILS: 0 % (ref 0.0–2.0)
EOSINOPHILS: 1 % (ref 0.5–7.8)
HCT: 44.8 % (ref 41.1–50.3)
HGB: 14.7 g/dL (ref 13.6–17.2)
IMMATURE GRANULOCYTES: 0 % (ref 0.0–5.0)
LYMPHOCYTES: 16 % (ref 13–44)
MCH: 29.9 PG (ref 26.1–32.9)
MCHC: 32.8 g/dL (ref 31.4–35.0)
MCV: 91.1 FL (ref 79.6–97.8)
MONOCYTES: 8 % (ref 4.0–12.0)
MPV: 9.8 FL (ref 9.4–12.3)
NEUTROPHILS: 75 % (ref 43–78)
PLATELET: 242 10*3/uL (ref 150–450)
RBC: 4.92 M/uL (ref 4.23–5.6)
RDW: 13.2 %
WBC: 8.9 10*3/uL (ref 4.3–11.1)

## 2017-04-19 LAB — METABOLIC PANEL, COMPREHENSIVE
A-G Ratio: 0.8 — ABNORMAL LOW (ref 1.2–3.5)
ALT (SGPT): 13 U/L (ref 12–65)
AST (SGOT): 30 U/L (ref 15–37)
Albumin: 3.4 g/dL — ABNORMAL LOW (ref 3.5–5.0)
Alk. phosphatase: 67 U/L (ref 50–136)
Anion gap: 15 mmol/L (ref 7–16)
BUN: 21 MG/DL (ref 6–23)
Bilirubin, total: 0.6 MG/DL (ref 0.2–1.1)
CO2: 23 mmol/L (ref 21–32)
Calcium: 8.3 MG/DL (ref 8.3–10.4)
Chloride: 100 mmol/L (ref 98–107)
Creatinine: 1.15 MG/DL (ref 0.8–1.5)
GFR est AA: >60 mL/min/{1.73_m2} (ref >60)
GFR est non-AA: >60 mL/min/{1.73_m2} (ref >60)
Globulin: 4.3 g/dL — ABNORMAL HIGH (ref 2.3–3.5)
Glucose: 107 mg/dL — ABNORMAL HIGH (ref 65–100)
Potassium: 4.4 mmol/L (ref 3.5–5.1)
Protein, total: 7.7 g/dL (ref 6.3–8.2)
Sodium: 138 mmol/L (ref 136–145)

## 2017-04-19 LAB — ETHYL ALCOHOL: ALCOHOL(ETHYL),SERUM: 1 MG/DL

## 2017-04-19 LAB — TROPONIN I: Troponin-I, Qt.: <0.04 NG/ML (ref 0.02–0.05)

## 2017-04-19 MED ORDER — CHLORDIAZEPOXIDE 25 MG CAP
25 mg | ORAL | Status: AC
Start: 2017-04-19 — End: 2017-04-19
  Administered 2017-04-19: 18:00:00 via ORAL

## 2017-04-19 MED ORDER — SODIUM CHLORIDE 0.9% BOLUS IV
0.9 % | Freq: Once | INTRAVENOUS | Status: DC
Start: 2017-04-19 — End: 2017-04-19

## 2017-04-19 MED ORDER — LORAZEPAM 2 MG/ML IJ SOLN
2 mg/mL | INTRAMUSCULAR | Status: AC
Start: 2017-04-19 — End: 2017-04-19
  Administered 2017-04-19: 19:00:00 via INTRAVENOUS

## 2017-04-19 MED ORDER — FOLIC ACID 5 MG/ML IJ SOLN
5 mg/mL | Freq: Once | INTRAMUSCULAR | Status: AC
Start: 2017-04-19 — End: 2017-04-19
  Administered 2017-04-19: 19:00:00 via INTRAVENOUS

## 2017-04-19 MED FILL — SODIUM CHLORIDE 0.9 % IV: INTRAVENOUS | Qty: 1000

## 2017-04-19 MED FILL — LORAZEPAM 2 MG/ML IJ SOLN: 2 mg/mL | INTRAMUSCULAR | Qty: 1

## 2017-04-19 MED FILL — CHLORDIAZEPOXIDE 25 MG CAP: 25 mg | ORAL | Qty: 1

## 2017-04-19 NOTE — ED Notes (Signed)
Patient brought via EMS with multiple complaints. Patient advises that he has been smoking meth and marijuna with about a liter of whiskey a day. Patient advises that he has not had any since noon yesterday and noted he felt shaky with chest pain going through to his back. Patient advises also with nausea, vomiting and diarrhea. Patient with shortness of breath.

## 2017-04-19 NOTE — Progress Notes (Signed)
Visited with patient.  States he is currently living at [facility]  States he was there for approximately 3 months, got a job, moved into an apartment for 1 month but then was kicked out because of the behavior of a friend and [facility].  States he was sober for 6 years and then 4 months ago he started drinking again and got to his former point very quickly.  States he drinks a pint of scotch a day and recently started doing crystal meth.  States he has already had a phone interview with the [facility].  Call to [facility] and spoke with Shanda Bumps who states they do not have any beds today but will make appointment for tomorrow at.  States she will call me if they have any 'no shows' for the appointments they have scheduled tonight.  Discussed [facility] and the FORCE Team with patient and he is interested in talking to a recovery coach.  Call to [facility] and spoke to [facility] who states she will be here in 30 minutes to speak with patient.  Records faxed to [facility] at their request.

## 2017-04-19 NOTE — Progress Notes (Signed)
Shanda Bumps RN from [facility] called and said to send patient over for 10am appointment tomorrow morning. States she will have night staff call me if the night appointments don't show up.

## 2017-04-19 NOTE — Progress Notes (Signed)
Lee Evans from [place] [facility] at bedside.

## 2017-04-19 NOTE — ED Provider Notes (Addendum)
HPI:  Patient is here with concern for alcohol withdrawal.  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  Last drink noon yesterday.  Feels shaky.  Skin itchiness sensation.  He also has some chest discomfort.  Denies any abdominal pain.        ROS  Constitutional: No fever, no chills  Skin:   Eye: No vision changes  ENMT:   Respiratory: No shortness of breath, no cough  Cardiovascular: + chest pain  Gastrointestinal: No vomiting, no nausea,  no abdominal pain  GU: No dysuria  MSK: No back pain, no muscle pain  Neuro: no change in mental status, no numbness, no tingling, no weakness  Psych:   Endocrine:   All other review of systems positive per history of present illness and the above otherwise negative or noncontributory.    Visit Vitals   ??? BP (!) 160/108 (BP 1 Location: Left arm, BP Patient Position: At rest)   ??? Pulse 94   ??? Temp 98.3 ??F (36.8 ??C)   ??? Resp 18   ??? Ht '5\' 10"'  (1.778 m)   ??? Wt 81.6 kg (180 lb)   ??? SpO2 100%   ??? BMI 25.83 kg/m2     Past Medical History:   Diagnosis Date   ??? Gastrointestinal disorder      History reviewed. No pertinent surgical history.  None         Adult Exam   General: alert, no acute distress  Head: normocephalic, atraumatic  ENT: moist mucous membranes  No tongue fasciculation noted.  Neck: supple, non-tender; full range of motion  Cardiovascular: regular rate and rhythm, normal peripheral perfusion, no edema  Respiratory:  normal respirations; no wheezing, rales or rhonchi  Gastrointestinal: soft, non-tender; no rebound or guarding, no peritoneal signs, no distension  Back: non-tender, full range of motion  Musculoskeletal: normal range of motion, normal strength, no gross deformities  Neurological: alert and oriented x 4, no gross focal deficits; normal speech  Psychiatric: cooperative; appropriate mood and affect    MDM: EKG rate of 92 sinus rhythm normal axis and interval.  No STEMI  noted.  No ischemic changes noted.  No ectopy of Brugada.  Patient is asking for rehabilitation.  Last in rehabilitation's 5 years ago.  Started drinking 5 months ago.  Drinks heavily.  Chest x-ray unremarkable  EKG without any acute ischemic changes. Troponin negative.  Low suspicion for ACS, dissection.  Chest x-ray clear.  We'll give a dose of IV Ativan here and Librium.    Our case manager is involved in care. Patient will be evaluated by [facility] center. Has been evaluated. Will likely have that opened tomorrow.  ED Course     Recent Results (from the past 12 hour(s))   EKG, 12 LEAD, INITIAL    Collection Time: 04/19/17 12:35 PM   Result Value Ref Range    Ventricular Rate 93 BPM    Atrial Rate 93 BPM    P-R Interval 152 ms    QRS Duration 86 ms    Q-T Interval 350 ms    QTC Calculation (Bezet) 435 ms    Calculated P Axis 55 degrees    Calculated R Axis -6 degrees    Calculated T Axis 29 degrees    Diagnosis       Normal sinus rhythm  Possible Inferior infarct , age undetermined  Abnormal ECG  No previous ECGs available     CBC WITH  AUTOMATED DIFF    Collection Time: 04/19/17  2:11 PM   Result Value Ref Range    WBC 8.9 4.3 - 11.1 K/uL    RBC 4.92 4.23 - 5.6 M/uL    HGB 14.7 13.6 - 17.2 g/dL    HCT 44.8 41.1 - 50.3 %    MCV 91.1 79.6 - 97.8 FL    MCH 29.9 26.1 - 32.9 PG    MCHC 32.8 31.4 - 35.0 g/dL    RDW 13.2 %    PLATELET 242 150 - 450 K/uL    MPV 9.8 9.4 - 12.3 FL    ABSOLUTE NRBC 0.00 0.0 - 0.2 K/uL    DF AUTOMATED      NEUTROPHILS 75 43 - 78 %    LYMPHOCYTES 16 13 - 44 %    MONOCYTES 8 4.0 - 12.0 %    EOSINOPHILS 1 0.5 - 7.8 %    BASOPHILS 0 0.0 - 2.0 %    IMMATURE GRANULOCYTES 0 0.0 - 5.0 %    ABS. NEUTROPHILS 6.7 K/UL    ABS. LYMPHOCYTES 1.4 K/UL    ABS. MONOCYTES 0.7 K/UL    ABS. EOSINOPHILS 0.1 K/UL    ABS. BASOPHILS 0.0 K/UL    ABS. IMM. GRANS. 0.0 K/UL   METABOLIC PANEL, COMPREHENSIVE    Collection Time: 04/19/17  2:11 PM   Result Value Ref Range    Sodium 138 136 - 145 mmol/L     Potassium 4.4 3.5 - 5.1 mmol/L    Chloride 100 98 - 107 mmol/L    CO2 23 21 - 32 mmol/L    Anion gap 15 7 - 16 mmol/L    Glucose 107 (H) 65 - 100 mg/dL    BUN 21 6 - 23 MG/DL    Creatinine 1.15 0.8 - 1.5 MG/DL    GFR est AA >60 >60 ml/min/1.34m    GFR est non-AA >60 >60 ml/min/1.742m   Calcium 8.3 8.3 - 10.4 MG/DL    Bilirubin, total 0.6 0.2 - 1.1 MG/DL    ALT (SGPT) 13 12 - 65 U/L    AST (SGOT) 30 15 - 37 U/L    Alk. phosphatase 67 50 - 136 U/L    Protein, total 7.7 6.3 - 8.2 g/dL    Albumin 3.4 (L) 3.5 - 5.0 g/dL    Globulin 4.3 (H) 2.3 - 3.5 g/dL    A-G Ratio 0.8 (L) 1.2 - 3.5     TROPONIN I    Collection Time: 04/19/17  2:11 PM   Result Value Ref Range    Troponin-I, Qt. <0.04 0.02 - 0.05 NG/ML   ETHYL ALCOHOL    Collection Time: 04/19/17  2:11 PM   Result Value Ref Range    ALCOHOL(ETHYL),SERUM 1 MG/DL         Xr Chest Port    Result Date: 04/19/2017  Portable AP upright chest dated 04/19/2017 at 1255 hours CLINICAL INFORMATION: Chest pain Heart is normal in size and mediastinum unremarkable. Pulmonary vascularity appears normal. Small calcified granuloma right lower lung. No pulmonary infiltrate. No pleural effusion or pneumothorax.     IMPRESSION: No acute abnormality    Recent Results (from the past 24 hour(s))   EKG, 12 LEAD, INITIAL    Collection Time: 04/19/17 12:35 PM   Result Value Ref Range    Ventricular Rate 93 BPM    Atrial Rate 93 BPM    P-R Interval 152 ms    QRS Duration 86 ms  Q-T Interval 350 ms    QTC Calculation (Bezet) 435 ms    Calculated P Axis 55 degrees    Calculated R Axis -6 degrees    Calculated T Axis 29 degrees    Diagnosis       Normal sinus rhythm  Possible Inferior infarct , age undetermined  Abnormal ECG  No previous ECGs available           Dragon voice recognition software was used to create this note. Although the note has been reviewed and corrected where necessary, additional errors may have been overlooked and remain in the text.

## 2017-04-20 LAB — EKG 12-LEAD
Atrial Rate: 93 {beats}/min
Diagnosis: NORMAL
P Axis: 55 degrees
P-R Interval: 152 ms
Q-T Interval: 350 ms
QRS Duration: 86 ms
QTc Calculation (Bazett): 435 ms
R Axis: -6 degrees
T Axis: 29 degrees
Ventricular Rate: 93 {beats}/min

## 2017-04-20 LAB — EKG, 12 LEAD, INITIAL
Atrial Rate: 93 {beats}/min
Calculated P Axis: 55 degrees
Calculated R Axis: -6 degrees
Calculated T Axis: 29 degrees
Diagnosis: NORMAL
P-R Interval: 152 ms
Q-T Interval: 350 ms
QRS Duration: 86 ms
QTC Calculation (Bezet): 435 ms
Ventricular Rate: 93 {beats}/min

## 2017-04-20 LAB — DRUG SCREEN, URINE
AMPHETAMINES: POSITIVE
BARBITURATES: NEGATIVE
BENZODIAZEPINES: NEGATIVE
COCAINE: NEGATIVE
METHADONE: NEGATIVE
OPIATES: NEGATIVE
PCP(PHENCYCLIDINE): NEGATIVE
THC (TH-CANNABINOL): POSITIVE

## 2017-04-20 MED ORDER — CHLORDIAZEPOXIDE 25 MG CAP
25 mg | Freq: Three times a day (TID) | ORAL | Status: DC
Start: 2017-04-20 — End: 2017-04-20
  Administered 2017-04-20 (×2): via ORAL

## 2017-04-20 MED ORDER — LORAZEPAM 2 MG/ML ORAL CONCENTRATE
2 mg/mL | Freq: Two times a day (BID) | ORAL | Status: DC
Start: 2017-04-20 — End: 2017-04-19

## 2017-04-20 MED ORDER — CHLORDIAZEPOXIDE 25 MG CAP
25 mg | Freq: Three times a day (TID) | ORAL | Status: DC
Start: 2017-04-20 — End: 2017-04-19

## 2017-04-20 MED ORDER — LORAZEPAM 1 MG TAB
1 mg | Freq: Two times a day (BID) | ORAL | Status: DC
Start: 2017-04-20 — End: 2017-04-20
  Administered 2017-04-20 (×2): via ORAL

## 2017-04-20 MED FILL — CHLORDIAZEPOXIDE 25 MG CAP: 25 mg | ORAL | Qty: 1

## 2017-04-20 MED FILL — LORAZEPAM 1 MG TAB: 1 mg | ORAL | Qty: 2

## 2017-04-20 NOTE — ED Notes (Signed)
Patient resting at this time in position of comfort, breakfast tray has not arrived.

## 2017-04-20 NOTE — ED Notes (Signed)
I have reviewed discharge instructions with the patient.  The patient verbalized understanding.    Patient left ED via Discharge Method: ambulatory to lobby to wait on taxi that will be transporting patient to [facility].    Opportunity for questions and clarification provided.       Patient given 0 scripts.         To continue your aftercare when you leave the hospital, you may receive an automated call from our care team to check in on how you are doing.  This is a free service and part of our promise to provide the best care and service to meet your aftercare needs.??? If you have questions, or wish to unsubscribe from this service please call [phone].  Thank you for Choosing our [facility] Emergency Department.

## 2017-04-20 NOTE — Progress Notes (Signed)
Call to [employer] to transport patient to [facility] at [address].     Abe People, BSW  Social Work Case Software engineer. [facility]  [phone]

## 2017-04-20 NOTE — ED Notes (Signed)
Patient noted to be sleeping at this time.

## 2017-04-20 NOTE — ED Notes (Signed)
Report to Lori, RN. Care transferred at this time.

## 2017-04-24 ENCOUNTER — Inpatient Hospital Stay
Admit: 2017-04-24 | Discharge: 2017-04-24 | Disposition: A | Discharge status: Home / Self Care | Payer: Self-pay | Attending: Emergency Medicine

## 2017-04-24 ENCOUNTER — Emergency Department: Admit: 2017-04-24 | Payer: Self-pay

## 2017-04-24 DIAGNOSIS — R1031 Right lower quadrant pain: Secondary | ICD-10-CM

## 2017-04-24 LAB — METABOLIC PANEL, COMPREHENSIVE
A-G Ratio: 0.9 — ABNORMAL LOW (ref 1.2–3.5)
ALT (SGPT): 20 U/L (ref 12–65)
AST (SGOT): 16 U/L (ref 15–37)
Albumin: 3.4 g/dL — ABNORMAL LOW (ref 3.5–5.0)
Alk. phosphatase: 57 U/L (ref 50–136)
Anion gap: 12 mmol/L (ref 7–16)
BUN: 8 MG/DL (ref 6–23)
Bilirubin, total: 0.2 MG/DL (ref 0.2–1.1)
CO2: 25 mmol/L (ref 21–32)
Calcium: 8.5 MG/DL (ref 8.3–10.4)
Chloride: 104 mmol/L (ref 98–107)
Creatinine: 1.09 MG/DL (ref 0.8–1.5)
GFR est AA: >60 mL/min/{1.73_m2} (ref >60)
GFR est non-AA: >60 mL/min/{1.73_m2} (ref >60)
Globulin: 3.6 g/dL — ABNORMAL HIGH (ref 2.3–3.5)
Glucose: 89 mg/dL (ref 65–100)
Potassium: 4.1 mmol/L (ref 3.5–5.1)
Protein, total: 7 g/dL (ref 6.3–8.2)
Sodium: 141 mmol/L (ref 136–145)

## 2017-04-24 LAB — CBC WITH AUTOMATED DIFF
ABS. BASOPHILS: 0.1 10*3/uL (ref 0.0–0.2)
ABS. EOSINOPHILS: 0.2 10*3/uL (ref 0.0–0.8)
ABS. IMM. GRANS.: 0 10*3/uL (ref 0.0–0.5)
ABS. LYMPHOCYTES: 1.7 10*3/uL (ref 0.5–4.6)
ABS. MONOCYTES: 0.4 10*3/uL (ref 0.1–1.3)
ABS. NEUTROPHILS: 4.7 10*3/uL (ref 1.7–8.2)
ABSOLUTE NRBC: 0 10*3/uL (ref 0.0–0.2)
BASOPHILS: 1 % (ref 0.0–2.0)
EOSINOPHILS: 3 % (ref 0.5–7.8)
HCT: 39.9 % — ABNORMAL LOW (ref 41.1–50.3)
HGB: 13 g/dL — ABNORMAL LOW (ref 13.6–17.2)
IMMATURE GRANULOCYTES: 1 % (ref 0.0–5.0)
LYMPHOCYTES: 25 % (ref 13–44)
MCH: 29.4 PG (ref 26.1–32.9)
MCHC: 32.6 g/dL (ref 31.4–35.0)
MCV: 90.3 FL (ref 79.6–97.8)
MONOCYTES: 5 % (ref 4.0–12.0)
MPV: 9.5 FL (ref 9.4–12.3)
NEUTROPHILS: 66 % (ref 43–78)
PLATELET: 270 10*3/uL (ref 150–450)
RBC: 4.42 M/uL (ref 4.23–5.6)
RDW: 12.6 %
WBC: 7 10*3/uL (ref 4.3–11.1)

## 2017-04-24 LAB — ETHYL ALCOHOL: ALCOHOL(ETHYL),SERUM: <3 MG/DL

## 2017-04-24 LAB — LIPASE: Lipase: 199 U/L (ref 73–393)

## 2017-04-24 MED ORDER — MORPHINE 8 MG/ML SYRINGE
8 mg/mL | INTRAMUSCULAR | Status: AC
Start: 2017-04-24 — End: 2017-04-24
  Administered 2017-04-24: 21:00:00 via INTRAVENOUS

## 2017-04-24 MED ORDER — SALINE PERIPHERAL FLUSH PRN
Freq: Once | INTRAMUSCULAR | Status: AC
Start: 2017-04-24 — End: 2017-04-24
  Administered 2017-04-24: 20:00:00

## 2017-04-24 MED ORDER — IOPAMIDOL 76 % IV SOLN
370 mg iodine /mL (76 %) | Freq: Once | INTRAVENOUS | Status: AC
Start: 2017-04-24 — End: 2017-04-24
  Administered 2017-04-24: 20:00:00 via INTRAVENOUS

## 2017-04-24 MED ORDER — HYDROMORPHONE (PF) 2 MG/ML IJ SOLN
2 mg/mL | INTRAMUSCULAR | Status: AC
Start: 2017-04-24 — End: 2017-04-24
  Administered 2017-04-24: 20:00:00 via INTRAVENOUS

## 2017-04-24 MED ORDER — SODIUM CHLORIDE 0.9% BOLUS IV
0.9 % | Freq: Once | INTRAVENOUS | Status: AC
Start: 2017-04-24 — End: 2017-04-24
  Administered 2017-04-24: 20:00:00 via INTRAVENOUS

## 2017-04-24 MED ORDER — PROMETHAZINE 25 MG/ML INJECTION
25 mg/mL | INTRAMUSCULAR | Status: AC
Start: 2017-04-24 — End: 2017-04-24
  Administered 2017-04-24: 20:00:00 via INTRAVENOUS

## 2017-04-24 MED FILL — MORPHINE 8 MG/ML SYRINGE: 8 mg/mL | INTRAMUSCULAR | Qty: 1

## 2017-04-24 MED FILL — PROMETHAZINE 25 MG/ML INJECTION: 25 mg/mL | INTRAMUSCULAR | Qty: 1

## 2017-04-24 MED FILL — HYDROMORPHONE (PF) 2 MG/ML IJ SOLN: 2 mg/mL | INTRAMUSCULAR | Qty: 1

## 2017-04-24 NOTE — Progress Notes (Signed)
Spoke to [place] at [facility] who states when we sent patient over to them on Friday he would not come inside.  States he did come back the next day and they were able to admit him but had to send him to [facility] over the weekend on Saturday and Sunday.  States she called report to [facility] and thought patient was going to go back there.  States if and when medically stable he can return to the [facility].

## 2017-04-24 NOTE — ED Notes (Signed)
Pt resting in bed. Medication given. Pt stated "if you are only giving me a half milligram of medicine, you will be coming right back."

## 2017-04-24 NOTE — ED Notes (Signed)
Pt wants more pain med.  Physician advised.  No orders given.

## 2017-04-24 NOTE — ED Notes (Signed)
I have reviewed discharge instructions with the patient.  The patient verbalized understanding.    Patient left ED via Discharge Method: ambulatory to waiting room to wait on cab to [facility] center.    Opportunity for questions and clarification provided.       Patient given 0 scripts.         To continue your aftercare when you leave the hospital, you may receive an automated call from our care team to check in on how you are doing.  This is a free service and part of our promise to provide the best care and service to meet your aftercare needs.??? If you have questions, or wish to unsubscribe from this service please call [phone].  Thank you for Choosing our [facility] Emergency Department.

## 2017-04-24 NOTE — ED Triage Notes (Addendum)
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 N/V/D  4mg  zofran given

## 2017-04-24 NOTE — ED Notes (Signed)
Pt states he cant get urine sample. Provider notified.

## 2017-04-24 NOTE — ED Provider Notes (Signed)
HPI Comments: Patient is a 46 year old male presents with abdominal pain.  States the pain has been worsening for the past couple of days, states pain located in his right lower quadrant, states nausea and vomiting.  No fevers or chills, no chest pain, no shortness of breath.  Of note patient has been seen for similar symptoms in the past few days both here and at nearby hospital and is also being treated at the [facility] for alcohol detox.    Patient is a 46 y.o. male presenting with abdominal pain. The history is provided by the patient. No language interpreter was used.   Abdominal Pain    Associated symptoms include nausea and vomiting. Pertinent negatives include no fever, no diarrhea, no dysuria, no headaches, no chest pain and no back pain.        Past Medical History:   Diagnosis Date   ??? Gastrointestinal disorder        No past surgical history on file.      No family history on file.    Social History     Social History   ??? Marital status: DIVORCED     Spouse name: N/A   ??? Number of children: N/A   ??? Years of education: N/A     Occupational History   ??? Not on file.     Social History Main Topics   ??? Smoking status: Current Every Day Smoker     Packs/day: 0.50   ??? Smokeless tobacco: Never Used   ??? Alcohol use Yes      Comment: liter   ??? Drug use: Yes     Special: Marijuana, Methamphetamines   ??? Sexual activity: Yes     Other Topics Concern   ??? Not on file     Social History Narrative         ALLERGIES: Pcn [penicillins]    Review of Systems   Constitutional: Negative for chills and fever.   HENT: Negative for rhinorrhea and sore throat.    Eyes: Negative for visual disturbance.   Respiratory: Negative for cough and shortness of breath.    Cardiovascular: Negative for chest pain and leg swelling.   Gastrointestinal: Positive for abdominal pain, nausea and vomiting. Negative for diarrhea.   Genitourinary: Negative for dysuria.   Musculoskeletal: Negative for back pain and neck pain.    Skin: Negative for rash.   Neurological: Negative for weakness and headaches.   Psychiatric/Behavioral: The patient is not nervous/anxious.        Vitals:    04/24/17 1422   BP: (!) 212/116   Pulse: 92   Resp: 20   Temp: 98.1 ??F (36.7 ??C)   SpO2: 98%   Weight: 81.6 kg (180 lb)   Height: '5\' 10"'  (1.778 m)            Physical Exam   Constitutional: He is oriented to person, place, and time. He appears well-developed and well-nourished.   HENT:   Head: Normocephalic.   Right Ear: External ear normal.   Left Ear: External ear normal.   Eyes: Conjunctivae and EOM are normal. Pupils are equal, round, and reactive to light.   Neck: Normal range of motion. Neck supple. No tracheal deviation present.   Cardiovascular: Normal rate, regular rhythm, normal heart sounds and intact distal pulses.    No murmur heard.  Pulmonary/Chest: Effort normal and breath sounds normal. No respiratory distress.   Abdominal: Soft. There is tenderness (right lower quadrant tenderness without tenderness  throughout otherwise.).   Musculoskeletal: Normal range of motion.   Neurological: He is alert and oriented to person, place, and time. No cranial nerve deficit.   Skin: No rash noted.   Nursing note and vitals reviewed.       MDM  Number of Diagnoses or Management Options  Abdominal pain, right lower quadrant: new and requires workup     Amount and/or Complexity of Data Reviewed  Clinical lab tests: ordered and reviewed  Tests in the radiology section of CPT??: ordered and reviewed  Tests in the medicine section of CPT??: ordered and reviewed  Review and summarize past medical records: yes    Risk of Complications, Morbidity, and/or Mortality  Presenting problems: high  Diagnostic procedures: high  Management options: high    Patient Progress  Patient progress: stable        ED Course       Procedures    Recent Results (from the past 12 hour(s))   CBC WITH AUTOMATED DIFF    Collection Time: 04/24/17  2:29 PM   Result Value Ref Range     WBC 7.0 4.3 - 11.1 K/uL    RBC 4.42 4.23 - 5.6 M/uL    HGB 13.0 (L) 13.6 - 17.2 g/dL    HCT 39.9 (L) 41.1 - 50.3 %    MCV 90.3 79.6 - 97.8 FL    MCH 29.4 26.1 - 32.9 PG    MCHC 32.6 31.4 - 35.0 g/dL    RDW 12.6 %    PLATELET 270 150 - 450 K/uL    MPV 9.5 9.4 - 12.3 FL    ABSOLUTE NRBC 0.00 0.0 - 0.2 K/uL    DF AUTOMATED      NEUTROPHILS 66 43 - 78 %    LYMPHOCYTES 25 13 - 44 %    MONOCYTES 5 4.0 - 12.0 %    EOSINOPHILS 3 0.5 - 7.8 %    BASOPHILS 1 0.0 - 2.0 %    IMMATURE GRANULOCYTES 1 0.0 - 5.0 %    ABS. NEUTROPHILS 4.7 1.7 - 8.2 K/UL    ABS. LYMPHOCYTES 1.7 0.5 - 4.6 K/UL    ABS. MONOCYTES 0.4 0.1 - 1.3 K/UL    ABS. EOSINOPHILS 0.2 0.0 - 0.8 K/UL    ABS. BASOPHILS 0.1 0.0 - 0.2 K/UL    ABS. IMM. GRANS. 0.0 0.0 - 0.5 K/UL   METABOLIC PANEL, COMPREHENSIVE    Collection Time: 04/24/17  2:29 PM   Result Value Ref Range    Sodium 141 136 - 145 mmol/L    Potassium 4.1 3.5 - 5.1 mmol/L    Chloride 104 98 - 107 mmol/L    CO2 25 21 - 32 mmol/L    Anion gap 12 7 - 16 mmol/L    Glucose 89 65 - 100 mg/dL    BUN 8 6 - 23 MG/DL    Creatinine 1.09 0.8 - 1.5 MG/DL    GFR est AA >60 >60 ml/min/1.86m    GFR est non-AA >60 >60 ml/min/1.752m   Calcium 8.5 8.3 - 10.4 MG/DL    Bilirubin, total 0.2 0.2 - 1.1 MG/DL    ALT (SGPT) 20 12 - 65 U/L    AST (SGOT) 16 15 - 37 U/L    Alk. phosphatase 57 50 - 136 U/L    Protein, total 7.0 6.3 - 8.2 g/dL    Albumin 3.4 (L) 3.5 - 5.0 g/dL    Globulin 3.6 (H) 2.3 - 3.5 g/dL  A-G Ratio 0.9 (L) 1.2 - 3.5     LIPASE    Collection Time: 04/24/17  2:29 PM   Result Value Ref Range    Lipase 199 73 - 393 U/L   ETHYL ALCOHOL    Collection Time: 04/24/17  2:29 PM   Result Value Ref Range    ALCOHOL(ETHYL),SERUM <3 MG/DL     Ct Abd Pelv W Cont    Result Date: 04/24/2017  CT abdomen and pelvis done with IV contrast using ER protocol April 24, 2017 Reference exam: March 21, 2017 Indication: Moderate right lower quadrant pain for 2 days Technique: Radiation dose reduction techniques  were used for this study using one or more of the following: automated exposure control; adjustment of mA and/or kV (according to patient size);  iterative reconstruction. 5 mm axial images were taken through the abdomen and pelvis using IV contrast using ER protocol. 100 cc Isovue 370 IV contrast was administered to better evaluate the abdominal and pelvic contents. Abdomen: The lung bases show some linear atelectasis or scar in the right base with a calcified granuloma on the right lower lobe laterally. The liver left lobe shows a probable cyst again measuring only 4 mm, too small to characterize as well as a small probable cyst anteriorly in the right lobe measuring 8 mm again. The gallbladder is contracted, the spleen, pancreas, adrenals appear normal. Retrocrural node on the right is again seen measuring only 7 x 5 mm. Kidneys appear normal. There is no intraperitoneal free air, ascites, seen. Some pericaval lymph nodes measure up to 1.3 x 0.7 cm, unchanged. Pelvis: The appendix appears normal, the bladder is distended but otherwise appears normal. There is no intraperitoneal free air, ascites, nor abnormal adenopathy seen.     IMPRESSION: 1. Probable cysts within the liver again seen. 2. Some adenopathy noted but unchanged, no infectious process identified. Lower probability of malignancy.    Xr Chest Port    Result Date: 04/19/2017  Portable AP upright chest dated 04/19/2017 at 1255 hours CLINICAL INFORMATION: Chest pain Heart is normal in size and mediastinum unremarkable. Pulmonary vascularity appears normal. Small calcified granuloma right lower lung. No pulmonary infiltrate. No pleural effusion or pneumothorax.     IMPRESSION: No acute abnormality    46 yo male with abdominal pain       Labs and imaging as above without concern for acute process at this time.  Discussed with patient need for follow up with PCP for further evaluation and workup of pain and adenopathy or return with any return or worsening  pain or any further concerns. Patient has a bed at the [facility] for continued inpatient detox at this time.

## 2017-04-26 ENCOUNTER — Emergency Department

## 2017-04-26 ENCOUNTER — Inpatient Hospital Stay
Admit: 2017-04-26 | Discharge: 2017-04-26 | Disposition: A | Discharge status: Home / Self Care | Payer: Self-pay | Attending: Student in an Organized Health Care Education/Training Program

## 2017-04-26 ENCOUNTER — Emergency Department: Admit: 2017-04-26 | Payer: Self-pay

## 2017-04-26 DIAGNOSIS — R1031 Right lower quadrant pain: Secondary | ICD-10-CM

## 2017-04-26 LAB — CBC WITH AUTOMATED DIFF
ABS. BASOPHILS: 0.1 10*3/uL (ref 0.0–0.2)
ABS. EOSINOPHILS: 0.3 10*3/uL (ref 0.0–0.8)
ABS. IMM. GRANS.: 0.1 10*3/uL (ref 0.0–0.5)
ABS. LYMPHOCYTES: 2.1 10*3/uL (ref 0.5–4.6)
ABS. MONOCYTES: 0.6 10*3/uL (ref 0.1–1.3)
ABS. NEUTROPHILS: 6.7 10*3/uL (ref 1.7–8.2)
ABSOLUTE NRBC: 0 10*3/uL (ref 0.0–0.2)
BASOPHILS: 1 % (ref 0.0–2.0)
EOSINOPHILS: 3 % (ref 0.5–7.8)
HCT: 43.4 % (ref 41.1–50.3)
HGB: 15 g/dL (ref 13.6–17.2)
IMMATURE GRANULOCYTES: 1 % (ref 0.0–5.0)
LYMPHOCYTES: 21 % (ref 13–44)
MCH: 30.4 PG (ref 26.1–32.9)
MCHC: 34.6 g/dL (ref 31.4–35.0)
MCV: 88 FL (ref 79.6–97.8)
MONOCYTES: 6 % (ref 4.0–12.0)
MPV: 9.5 FL (ref 9.4–12.3)
NEUTROPHILS: 69 % (ref 43–78)
PLATELET: 393 10*3/uL (ref 150–450)
RBC: 4.93 M/uL (ref 4.23–5.6)
RDW: 12.3 %
WBC: 9.7 10*3/uL (ref 4.3–11.1)

## 2017-04-26 LAB — METABOLIC PANEL, COMPREHENSIVE
A-G Ratio: 0.9 — ABNORMAL LOW (ref 1.2–3.5)
ALT (SGPT): 22 U/L (ref 12–65)
AST (SGOT): 10 U/L — ABNORMAL LOW (ref 15–37)
Albumin: 3.8 g/dL (ref 3.5–5.0)
Alk. phosphatase: 62 U/L (ref 50–136)
Anion gap: 8 mmol/L (ref 7–16)
BUN: 14 MG/DL (ref 6–23)
Bilirubin, total: 0.3 MG/DL (ref 0.2–1.1)
CO2: 28 mmol/L (ref 21–32)
Calcium: 9.4 MG/DL (ref 8.3–10.4)
Chloride: 101 mmol/L (ref 98–107)
Creatinine: 1.04 MG/DL (ref 0.8–1.5)
GFR est AA: >60 mL/min/{1.73_m2} (ref >60)
GFR est non-AA: >60 mL/min/{1.73_m2} (ref >60)
Globulin: 4.4 g/dL — ABNORMAL HIGH (ref 2.3–3.5)
Glucose: 90 mg/dL (ref 65–100)
Potassium: 4.4 mmol/L (ref 3.5–5.1)
Protein, total: 8.2 g/dL (ref 6.3–8.2)
Sodium: 137 mmol/L (ref 136–145)

## 2017-04-26 LAB — DRUG SCREEN, URINE
AMPHETAMINES: NEGATIVE
BARBITURATES: POSITIVE
BENZODIAZEPINES: POSITIVE
COCAINE: NEGATIVE
METHADONE: NEGATIVE
OPIATES: POSITIVE
PCP(PHENCYCLIDINE): NEGATIVE
THC (TH-CANNABINOL): POSITIVE

## 2017-04-26 LAB — ETHYL ALCOHOL: ALCOHOL(ETHYL),SERUM: <3 MG/DL

## 2017-04-26 MED ORDER — SODIUM CHLORIDE 0.9% BOLUS IV
0.9 % | Freq: Once | INTRAVENOUS | Status: AC
Start: 2017-04-26 — End: 2017-04-26
  Administered 2017-04-26: 14:00:00 via INTRAVENOUS

## 2017-04-26 MED ORDER — ONDANSETRON 4 MG TAB, RAPID DISSOLVE
4 mg | ORAL_TABLET | Freq: Three times a day (TID) | ORAL | 2 refills | Status: DC | PRN
Start: 2017-04-26 — End: 2017-06-15

## 2017-04-26 MED ORDER — KETOROLAC TROMETHAMINE 30 MG/ML INJECTION
30 mg/mL (1 mL) | INTRAMUSCULAR | Status: AC
Start: 2017-04-26 — End: 2017-04-26
  Administered 2017-04-26: 14:00:00 via INTRAVENOUS

## 2017-04-26 MED ORDER — MORPHINE 2 MG/ML INJECTION
2 mg/mL | INTRAMUSCULAR | Status: AC
Start: 2017-04-26 — End: 2017-04-26
  Administered 2017-04-26: 15:00:00 via INTRAVENOUS

## 2017-04-26 MED ORDER — IBUPROFEN 800 MG TAB
800 mg | ORAL_TABLET | Freq: Four times a day (QID) | ORAL | 0 refills | Status: AC | PRN
Start: 2017-04-26 — End: 2017-05-03

## 2017-04-26 MED ORDER — CYCLOBENZAPRINE 5 MG TAB
5 mg | ORAL_TABLET | Freq: Three times a day (TID) | ORAL | 0 refills | Status: DC | PRN
Start: 2017-04-26 — End: 2017-04-26

## 2017-04-26 MED ORDER — HYDROCODONE-ACETAMINOPHEN 7.5 MG-325 MG TAB
ORAL | Status: AC
Start: 2017-04-26 — End: 2017-04-26
  Administered 2017-04-26: 16:00:00 via ORAL

## 2017-04-26 MED FILL — MORPHINE 2 MG/ML INJECTION: 2 mg/mL | INTRAMUSCULAR | Qty: 2

## 2017-04-26 MED FILL — HYDROCODONE-ACETAMINOPHEN 7.5 MG-325 MG TAB: ORAL | Qty: 1

## 2017-04-26 MED FILL — KETOROLAC TROMETHAMINE 30 MG/ML INJECTION: 30 mg/mL (1 mL) | INTRAMUSCULAR | Qty: 1

## 2017-04-26 NOTE — ED Provider Notes (Addendum)
HPI Comments: 45 year old male patient presents with severe right-sided groin/testicular pain.  Patient states his pain started yesterday.  He has been experiencing some mild discomfort in the right lower quadrant of his abdomen for several days.  He presented to [facility] emergency Department earlier this week for similar symptoms was evaluated with an unremarkable workup at that time.  Patient states his pain has progressed significantly over the past 24 hours.  He denies describes sharp stabbing pain in the right lower quadrant radiating to the right testicle.  Patient reports a long history of kidney stones and states this feels much like previous kidney stones in the past.  He denies requiring surgery in the past for kidney stones.  Patient reports nausea without vomiting.  He denies fever.  Denies changes in bowel habits.  Difficulty urinating per report.  History limited by patient discomfort.    Patient is a 46 y.o. male presenting with groin pain. The history is provided by the patient.   Groin Pain   This is a new problem. The current episode started yesterday. The problem occurs constantly. The problem has been gradually worsening. Associated symptoms include abdominal pain. Pertinent negatives include no chest pain, no headaches and no shortness of breath. Nothing aggravates the symptoms. Nothing relieves the symptoms.        Past Medical History:   Diagnosis Date   ??? Gastrointestinal disorder        No past surgical history on file.      No family history on file.    Social History     Social History   ??? Marital status: DIVORCED     Spouse name: N/A   ??? Number of children: N/A   ??? Years of education: N/A     Occupational History   ??? Not on file.     Social History Main Topics   ??? Smoking status: Current Every Day Smoker     Packs/day: 0.50   ??? Smokeless tobacco: Never Used   ??? Alcohol use Yes      Comment: liter   ??? Drug use: Yes     Special: Marijuana, Methamphetamines   ??? Sexual activity: Yes      Other Topics Concern   ??? Not on file     Social History Narrative         ALLERGIES: Pcn [penicillins]    Review of Systems   Constitutional: Negative for chills, diaphoresis and fever.   HENT: Negative for congestion, sneezing and sore throat.    Eyes: Negative for visual disturbance.   Respiratory: Negative for cough, chest tightness, shortness of breath and wheezing.    Cardiovascular: Negative for chest pain and leg swelling.   Gastrointestinal: Positive for abdominal pain. Negative for blood in stool, diarrhea, nausea and vomiting.   Endocrine: Negative for polyuria.   Genitourinary: Negative for difficulty urinating, dysuria, flank pain, hematuria and urgency.   Musculoskeletal: Negative for back pain, myalgias, neck pain and neck stiffness.   Skin: Negative for color change and rash.   Neurological: Negative for dizziness, syncope, speech difficulty, weakness, light-headedness, numbness and headaches.   Psychiatric/Behavioral: Negative for behavioral problems.   All other systems reviewed and are negative.      Vitals:    04/26/17 0942   BP: (!) 173/126   Pulse: (!) 126   Resp: 20   Temp: 98 ??F (36.7 ??C)   SpO2: 96%   Weight: 81.6 kg (180 lb)   Height: 5\' 10"  (1.778 m)  Physical Exam   Constitutional: He is oriented to person, place, and time. He appears well-developed and well-nourished. No distress.   Alert and oriented to person, place and time. No acute distress. Speaks in clear, fluent sentences.      HENT:   Head: Normocephalic and atraumatic.   Nose: Nose normal.   Eyes: Conjunctivae and EOM are normal. Pupils are equal, round, and reactive to light.   Neck: Normal range of motion. Neck supple. No JVD present. No tracheal deviation present.   Cardiovascular: Normal rate, regular rhythm, S1 normal, S2 normal, normal heart sounds and intact distal pulses.  Exam reveals no gallop, no distant heart sounds and no friction rub.    No murmur heard.   Pulmonary/Chest: Effort normal and breath sounds normal. No accessory muscle usage or stridor. No tachypnea and no bradypnea. No respiratory distress. He has no decreased breath sounds. He has no wheezes. He has no rhonchi. He has no rales. He exhibits no tenderness.   Abdominal: Soft. Normal appearance. He exhibits no distension and no mass. There is no hepatosplenomegaly, splenomegaly or hepatomegaly. There is tenderness in the right lower quadrant. There is guarding and CVA tenderness. There is no rigidity, no rebound, no tenderness at McBurney's point and negative Murphy's sign. Hernia confirmed negative in the right inguinal area and confirmed negative in the left inguinal area.   States CVA tenderness, generalized abdominal guarding.  Mild discomfort in the right lower quadrant though not associated with McBurney's point.  Negative Murphy sign.    Patient currently clenching his groin at this time.  We will reevaluate after he is medicated placed in room where I can do a full genital exam.   Genitourinary: Cremasteric reflex is present. Right testis shows tenderness. Right testis shows no mass and no swelling. Right testis is descended. Cremasteric reflex is not absent on the right side. Left testis shows no mass, no swelling and no tenderness. Left testis is descended. Cremasteric reflex is not absent on the left side. Circumcised.   Genitourinary Comments: GU exam preformed with male chaperone at bedside.   No focal findings with examination of the left or right testicle.  There is no fullness in the inguinal canal or palpable lymphadenopathy.   Musculoskeletal: Normal range of motion. He exhibits no edema, tenderness or deformity.   Lymphadenopathy:        Right: No inguinal adenopathy present.        Left: No inguinal adenopathy present.   Neurological: He is alert and oriented to person, place, and time. No cranial nerve deficit.   Skin: Skin is warm and dry. No rash noted. He is not diaphoretic.    Psychiatric: He has a normal mood and affect. His behavior is normal.   Nursing note and vitals reviewed.       MDM  Number of Diagnoses or Management Options  Abdominal pain, chronic, right lower quadrant: new and requires workup  Diagnosis management comments: Patient reports no other  Medical history aside from kidney stones however upon review of his chart, patient has presented to this department and other departments in the area with various medical complaints including history of ulcerative colitis, chest pain and intermittent right lower quadrant pain.  He has been seen several times for these complaints this month alone (most recently on 04/19/17).  RECENT CT AB/PELV.  ============================================================================  CT abdomen and pelvis done with IV contrast using ER protocol April 24, 2017  ??  Reference exam: March 21, 2017  ??  Indication: Moderate right lower quadrant pain for 2 days  ??  Technique: Radiation dose reduction techniques were used for this study using  one or more of the following: automated exposure control; adjustment of mA  and/or kV (according to patient size);  iterative reconstruction. 5 mm axial  images were taken through the abdomen and pelvis using IV contrast using ER  protocol. 100 cc Isovue 370 IV contrast was administered to better evaluate the  abdominal and pelvic contents.  ??  Abdomen: The lung bases show some linear atelectasis or scar in the right base  with a calcified granuloma on the right lower lobe laterally. The liver left  lobe shows a probable cyst again measuring only 4 mm, too small to characterize  as well as a small probable cyst anteriorly in the right lobe measuring 8 mm  again. The gallbladder is contracted, the spleen, pancreas, adrenals appear  normal. Retrocrural node on the right is again seen measuring only 7 x 5 mm.  Kidneys appear normal. There is no intraperitoneal free air, ascites, seen. Some   pericaval lymph nodes measure up to 1.3 x 0.7 cm, unchanged.  ??  Pelvis: The appendix appears normal, the bladder is distended but otherwise  appears normal. There is no intraperitoneal free air, ascites, nor abnormal  adenopathy seen.  ??  IMPRESSION  IMPRESSION:  1. Probable cysts within the liver again seen.  2. Some adenopathy noted but unchanged, no infectious process identified. Lower  probability of malignancy.   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  Confronted patient about this as well.  States his last use was approximately one week ago.  He has not had alcohol in over a week as well.  Discussed patient case with case management to contact [facility] to further assess patient's treatment history.    Patient actively moaning in the hallway despite  2 separate administrations of medication for pain.   GU exam preformed with male chaperone at bedside.   No focal findings with examination of the left or right testicle.  There is no fullness in the inguinal canal or palpable lymphadenopathy.    Case management, after discussion with [facility], evidently patient was discharged from this facility today.  Pain center staff states patient has a left of this rehabilitation facility multiple times during his stay to be evaluated at local emergency departments for various complaints.    CT imaging and ultrasound imaging are unremarkable.  No evidence of testicular torsion on my exam as well.  There is no palpable hernia.  Patient has refused to provide urine during his visit.  He states he may  be able to provide some now.  Suspicion for drug-seeking behavior.    Patient will be referred to urologist, he will not receive any narcotic pain medications upon discharge.       Amount and/or Complexity of Data Reviewed  Clinical lab tests: ordered and reviewed  Tests in the radiology section of CPT??: reviewed and ordered  Tests in the medicine section of CPT??: ordered and reviewed  Review and summarize past medical records: yes  Independent visualization of images, tracings, or specimens: yes    Risk of Complications, Morbidity, and/or  Mortality  Presenting problems: moderate  Diagnostic procedures: low  Management options: moderate    Patient Progress  Patient progress: stable        ED Course       Procedures

## 2017-04-26 NOTE — ED Notes (Signed)
I have reviewed discharge instructions with the patient.  The patient verbalized understanding.    Patient left ED via Discharge Method: ambulatory to Home with self.    Opportunity for questions and clarification provided.       Patient given 2 scripts.         To continue your aftercare when you leave the hospital, you may receive an automated call from our care team to check in on how you are doing.  This is a free service and part of our promise to provide the best care and service to meet your aftercare needs.??? If you have questions, or wish to unsubscribe from this service please call [phone].  Thank you for Choosing our [place] Emergency Department.

## 2017-04-26 NOTE — ED Notes (Signed)
Pt unable to provide urine sample at this time.

## 2017-04-26 NOTE — ED Notes (Signed)
Patient refused discharge vitals.  IV removed.

## 2017-04-26 NOTE — ED Triage Notes (Addendum)
Pt arrived via ems from a detox center. Ems states the pt is complaining of groin pain. Ems gave 5 mg of morphine and 4 mg zofran. Ems states the pt was [facility] for [facility]. Pt states having pain to the right side of his groin that started this morning. Pt states it feels like a kidney stone.

## 2017-04-26 NOTE — ED Notes (Signed)
Pt to CT with transport

## 2017-06-15 ENCOUNTER — Emergency Department: Admit: 2017-06-16 | Payer: Self-pay

## 2017-06-15 ENCOUNTER — Inpatient Hospital Stay
Admit: 2017-06-15 | Discharge: 2017-06-16 | Disposition: A | Discharge status: Home / Self Care | Payer: Self-pay | Attending: Emergency Medicine

## 2017-06-15 DIAGNOSIS — K5900 Constipation, unspecified: Secondary | ICD-10-CM

## 2017-06-15 LAB — URINALYSIS W/ RFLX MICROSCOPIC
Bilirubin: NEGATIVE
Blood: NEGATIVE
Glucose: NEGATIVE mg/dL
Ketone: NEGATIVE mg/dL
Leukocyte Esterase: NEGATIVE
Nitrites: NEGATIVE
Protein: NEGATIVE mg/dL
Specific gravity: 1.023 (ref 1.001–1.023)
Urobilinogen: 0.2 EU/dL (ref 0.2–1.0)
pH (UA): 5.5 (ref 5.0–9.0)

## 2017-06-15 LAB — METABOLIC PANEL, COMPREHENSIVE
A-G Ratio: 1
ALT (SGPT): 18 U/L (ref 12–65)
AST (SGOT): 10 U/L — ABNORMAL LOW (ref 15–37)
Albumin: 4 g/dL (ref 3.5–5.0)
Alk. phosphatase: 68 U/L (ref 50–136)
Anion gap: 7 mmol/L
BUN: 15 MG/DL (ref 6–23)
Bilirubin, total: 0.6 MG/DL (ref 0.2–1.1)
CO2: 27 mmol/L (ref 21–32)
Calcium: 9.1 MG/DL (ref 8.3–10.4)
Chloride: 104 mmol/L (ref 98–107)
Creatinine: 1.06 MG/DL (ref 0.8–1.5)
GFR est AA: >60 mL/min/{1.73_m2} (ref >60)
GFR est non-AA: >60 mL/min/{1.73_m2}
Globulin: 4 g/dL — ABNORMAL HIGH (ref 2.3–3.5)
Glucose: 99 mg/dL (ref 65–100)
Potassium: 3.8 mmol/L (ref 3.5–5.1)
Protein, total: 8 g/dL
Sodium: 138 mmol/L (ref 136–145)

## 2017-06-15 LAB — CBC W/O DIFF
ABSOLUTE NRBC: 0 10*3/uL (ref 0.0–0.2)
HCT: 42.5 % (ref 41.1–50.3)
HGB: 14 g/dL (ref 13.6–17.2)
MCH: 29.2 PG (ref 26.1–32.9)
MCHC: 32.9 g/dL (ref 31.4–35.0)
MCV: 88.5 FL (ref 79.6–97.8)
MPV: 9.6 FL (ref 9.4–12.3)
PLATELET: 291 10*3/uL (ref 150–450)
RBC: 4.8 M/uL (ref 4.23–5.6)
RDW: 13.4 %
WBC: 12 10*3/uL — ABNORMAL HIGH (ref 4.3–11.1)

## 2017-06-15 NOTE — ED Notes (Signed)
Report given to lori rn

## 2017-06-15 NOTE — ED Triage Notes (Signed)
Pt to er by ems, pt c/o abd pain for one day, denies n/v/d, denies taking anything for the pain

## 2017-06-15 NOTE — ED Notes (Signed)
Zofran 4 mg IV given.

## 2017-06-15 NOTE — ED Notes (Signed)
PO contrast completed.

## 2017-06-15 NOTE — ED Notes (Signed)
I have reviewed discharge instructions with the patient.  The patient verbalized understanding.    Patient left ED via Discharge Method: ambulatory to Home with self.  Opportunity for questions and clarification provided.       Patient given 2 scripts.         To continue your aftercare when you leave the hospital, you may receive an automated call from our care team to check in on how you are doing.  This is a free service and part of our promise to provide the best care and service to meet your aftercare needs.??? If you have questions, or wish to unsubscribe from this service please call [phone].  Thank you for Choosing our [place] [facility].

## 2017-06-15 NOTE — ED Notes (Signed)
Dilauded 0.5 mg IV given for pain. SR up x 2.

## 2017-06-16 LAB — LIPASE: Lipase: 88 U/L (ref 73–393)

## 2017-06-16 LAB — DRUG SCREEN, URINE
AMPHETAMINES: NEGATIVE
BARBITURATES: NEGATIVE
BENZODIAZEPINES: NEGATIVE
COCAINE: NEGATIVE
METHADONE: NEGATIVE
OPIATES: NEGATIVE
PCP(PHENCYCLIDINE): NEGATIVE
THC (TH-CANNABINOL): NEGATIVE

## 2017-06-16 LAB — CK: CK: 81 U/L (ref 21–215)

## 2017-06-16 LAB — ETHYL ALCOHOL: ALCOHOL(ETHYL),SERUM: <3 MG/DL

## 2017-06-16 MED ORDER — IOPAMIDOL 76 % IV SOLN
370 mg iodine /mL (76 %) | Freq: Once | INTRAVENOUS | Status: AC
Start: 2017-06-16 — End: 2017-06-15
  Administered 2017-06-16: 03:00:00 via INTRAVENOUS

## 2017-06-16 MED ORDER — KETOROLAC TROMETHAMINE 30 MG/ML INJECTION
30 mg/mL (1 mL) | INTRAMUSCULAR | Status: AC
Start: 2017-06-16 — End: 2017-06-15
  Administered 2017-06-16: via INTRAVENOUS

## 2017-06-16 MED ORDER — DICYCLOMINE 10 MG CAP
10 mg | Freq: Four times a day (QID) | ORAL | Status: DC
Start: 2017-06-16 — End: 2017-06-15

## 2017-06-16 MED ORDER — DIATRIZOATE MEGLUMINE & SODIUM 66 %-10 % ORAL SOLN
66-10 % | Freq: Once | ORAL | Status: AC
Start: 2017-06-16 — End: 2017-06-15
  Administered 2017-06-16: 02:00:00 via ORAL

## 2017-06-16 MED ORDER — ONDANSETRON (PF) 4 MG/2 ML INJECTION
4 mg/2 mL | INTRAMUSCULAR | Status: AC
Start: 2017-06-16 — End: 2017-06-15
  Administered 2017-06-16: 02:00:00 via INTRAVENOUS

## 2017-06-16 MED ORDER — SALINE PERIPHERAL FLUSH PRN
Freq: Once | INTRAMUSCULAR | Status: AC
Start: 2017-06-16 — End: 2017-06-15
  Administered 2017-06-16: 03:00:00

## 2017-06-16 MED ORDER — HYDROMORPHONE (PF) 2 MG/ML IJ SOLN
2 mg/mL | INTRAMUSCULAR | Status: AC
Start: 2017-06-16 — End: 2017-06-15
  Administered 2017-06-16: 02:00:00 via INTRAVENOUS

## 2017-06-16 MED ORDER — DICYCLOMINE 10 MG CAP
10 mg | ORAL | Status: AC
Start: 2017-06-16 — End: 2017-06-15
  Administered 2017-06-16: 04:00:00 via ORAL

## 2017-06-16 MED ORDER — SODIUM CHLORIDE 0.9% BOLUS IV
0.9 % | Freq: Once | INTRAVENOUS | Status: AC
Start: 2017-06-16 — End: 2017-06-15
  Administered 2017-06-16: via INTRAVENOUS

## 2017-06-16 MED ORDER — POLYETHYLENE GLYCOL 3350 100 % ORAL POWDER
17 gram/dose | Freq: Every day | ORAL | 0 refills | Status: AC
Start: 2017-06-16 — End: ?

## 2017-06-16 MED ORDER — SODIUM CHLORIDE 0.9% BOLUS IV
0.9 % | Freq: Once | INTRAVENOUS | Status: AC
Start: 2017-06-16 — End: 2017-06-16
  Administered 2017-06-16: 03:00:00 via INTRAVENOUS

## 2017-06-16 MED ORDER — DICYCLOMINE 20 MG TAB
20 mg | ORAL_TABLET | Freq: Four times a day (QID) | ORAL | 0 refills | Status: AC
Start: 2017-06-16 — End: 2017-06-20

## 2017-06-16 MED FILL — MD-GASTROVIEW 66 %-10 % ORAL SOLUTION: 66-10 % | ORAL | Qty: 30

## 2017-06-16 MED FILL — ONDANSETRON (PF) 4 MG/2 ML INJECTION: 4 mg/2 mL | INTRAMUSCULAR | Qty: 2

## 2017-06-16 MED FILL — DICYCLOMINE 10 MG CAP: 10 mg | ORAL | Qty: 1

## 2017-06-16 MED FILL — HYDROMORPHONE (PF) 2 MG/ML IJ SOLN: 2 mg/mL | INTRAMUSCULAR | Qty: 1

## 2017-06-16 MED FILL — KETOROLAC TROMETHAMINE 30 MG/ML INJECTION: 30 mg/mL (1 mL) | INTRAMUSCULAR | Qty: 1

## 2017-06-16 NOTE — ED Notes (Signed)
Cab voucher given to pt[facility]

## 2017-06-16 NOTE — ED Provider Notes (Signed)
Patient is a 46 year old male presenting with abdominal pain for 1 day.   He states pain is diffuse.  No significant vomiting, fevers, diarrhea or sick contacts.  No significant previous abdominal surgeries.  Currently working hours       The history is provided by the patient. No language interpreter was used.        Past Medical History:   Diagnosis Date   ??? Gastrointestinal disorder        History reviewed. No pertinent surgical history.      History reviewed. No pertinent family history.    Social History     Socioeconomic History   ??? Marital status: DIVORCED     Spouse name: Not on file   ??? Number of children: Not on file   ??? Years of education: Not on file   ??? Highest education level: Not on file   Social Needs   ??? Financial resource strain: Not on file   ??? Food insecurity - worry: Not on file   ??? Food insecurity - inability: Not on file   ??? Transportation needs - medical: Not on file   ??? Transportation needs - non-medical: Not on file   Occupational History   ??? Not on file   Tobacco Use   ??? Smoking status: Current Every Day Smoker     Packs/day: 0.50   ??? Smokeless tobacco: Never Used   Substance and Sexual Activity   ??? Alcohol use: Yes     Comment: liter   ??? Drug use: Yes     Types: Marijuana, Methamphetamines   ??? Sexual activity: Yes   Other Topics Concern   ??? Not on file   Social History Narrative   ??? Not on file         ALLERGIES: Pcn [penicillins]    Review of Systems   Constitutional: Negative for fatigue and fever.   HENT: Negative for sore throat.    Respiratory: Negative for cough, chest tightness and shortness of breath.    Cardiovascular: Negative for leg swelling.   Gastrointestinal: Positive for abdominal pain. Negative for diarrhea, nausea and vomiting.   Genitourinary: Negative for dysuria.   Musculoskeletal: Negative for back pain.   Neurological: Negative for syncope and headaches.   Psychiatric/Behavioral: Negative for confusion.       Vitals:    06/15/17 1904 06/15/17 2200 06/15/17 2358    BP: (!) 147/91 182/88 138/80   Pulse: 86  82   Resp: 14 20 18    Temp: 98 ??F (36.7 ??C)     SpO2: 98% 98% 98%   Weight: 81.6 kg (180 lb)     Height: 5\' 10"  (1.778 m)              Physical Exam   Constitutional: He is oriented to person, place, and time. He appears well-developed and well-nourished. No distress.   HENT:   Head: Normocephalic and atraumatic.   Eyes: Conjunctivae and EOM are normal. Pupils are equal, round, and reactive to light.   Neck: Normal range of motion. Neck supple.   Cardiovascular: Normal rate, regular rhythm and normal heart sounds.   Pulmonary/Chest: Effort normal and breath sounds normal. No respiratory distress. He has no wheezes. He has no rales.   Abdominal: Soft. Normal appearance and bowel sounds are normal. He exhibits no distension. There is tenderness. There is no rebound.   Diffuse mild tenderness.   Musculoskeletal: Normal range of motion. He exhibits no edema or tenderness.  Neurological: He is alert and oriented to person, place, and time.   Skin: Skin is warm and dry. No rash noted. He is not diaphoretic.   Psychiatric: He has a normal mood and affect. His behavior is normal.   Vitals reviewed.       MDM  Number of Diagnoses or Management Options  Constipation, unspecified constipation type: new and does not require workup  Diagnosis management comments: Patient quite dramatic.  CT scan negative for any acute findings other than constipation.  Requesting pain medication.  He has no tachycardia hypertension suggests severe pain.  We'll discharge home in stable condition and have a follow up PCP.  Return precautions discussed.      Hulen Skains, MD; 06/17/2017 @7 :24 AM Voice dictation software was used during the making of this note.  This software is not perfect and grammatical and other typographical errors may be present.  This note has not been proofread for errors.  ===================================================================          Amount and/or Complexity of Data Reviewed  Clinical lab tests: reviewed and ordered  Tests in the radiology section of CPT??: ordered and reviewed  Review and summarize past medical records: yes  Independent visualization of images, tracings, or specimens: yes    Risk of Complications, Morbidity, and/or Mortality  Presenting problems: moderate  Diagnostic procedures: moderate  Management options: moderate    Patient Progress  Patient progress: improved    ED Course as of Jun 17 720   Thu Jun 15, 2017   2325 CT negative for acute findings.  Shows mild constipation.  Patient requesting further narcotic pain medications.  Told him I'll refrain from this because it makes constipation worse.  We'll give dose of Bentyl and dischargedwith Bentyl as well as MiraLAX.  [JL]      ED Course User Index  [JL] Hulen Skains, MD       Procedures

## 2017-08-30 ENCOUNTER — Emergency Department: Admit: 2017-08-30 | Payer: Self-pay

## 2017-08-30 ENCOUNTER — Inpatient Hospital Stay
Admit: 2017-08-30 | Discharge: 2017-08-30 | Disposition: A | Discharge status: Home / Self Care | Payer: Self-pay | Attending: Emergency Medicine

## 2017-08-30 DIAGNOSIS — J069 Acute upper respiratory infection, unspecified: Secondary | ICD-10-CM

## 2017-08-30 MED ORDER — DOXYCYCLINE HYCLATE 100 MG CAP
100 mg | ORAL | Status: AC
Start: 2017-08-30 — End: 2017-08-30
  Administered 2017-08-30: 23:00:00 via ORAL

## 2017-08-30 MED ORDER — IPRATROPIUM-ALBUTEROL 2.5 MG-0.5 MG/3 ML NEB SOLUTION
2.5 mg-0.5 mg/3 ml | RESPIRATORY_TRACT | Status: AC
Start: 2017-08-30 — End: 2017-08-30
  Administered 2017-08-30: 23:00:00 via RESPIRATORY_TRACT

## 2017-08-30 MED ORDER — NAPROXEN 250 MG TAB
250 mg | ORAL | Status: AC
Start: 2017-08-30 — End: 2017-08-30
  Administered 2017-08-30: via ORAL

## 2017-08-30 MED ORDER — PREDNISONE 20 MG TAB
20 mg | ORAL | Status: AC
Start: 2017-08-30 — End: 2017-08-30
  Administered 2017-08-30: 23:00:00 via ORAL

## 2017-08-30 MED ORDER — DOXYCYCLINE HYCLATE 100 MG TAB
100 mg | ORAL_TABLET | Freq: Two times a day (BID) | ORAL | 0 refills | Status: AC
Start: 2017-08-30 — End: 2017-09-06

## 2017-08-30 MED ORDER — ALBUTEROL SULFATE HFA 90 MCG/ACTUATION AEROSOL INHALER
90 mcg/actuation | Freq: Four times a day (QID) | RESPIRATORY_TRACT | 0 refills | Status: AC | PRN
Start: 2017-08-30 — End: ?

## 2017-08-30 MED FILL — PREDNISONE 20 MG TAB: 20 mg | ORAL | Qty: 3

## 2017-08-30 MED FILL — DOXYCYCLINE HYCLATE 100 MG CAP: 100 mg | ORAL | Qty: 1

## 2017-08-30 MED FILL — NAPROXEN 250 MG TAB: 250 mg | ORAL | Qty: 2

## 2017-08-30 MED FILL — IPRATROPIUM-ALBUTEROL 2.5 MG-0.5 MG/3 ML NEB SOLUTION: 2.5 mg-0.5 mg/3 ml | RESPIRATORY_TRACT | Qty: 3

## 2017-08-30 NOTE — ED Notes (Signed)
I have reviewed discharge instructions with the patient.  The patient verbalized understanding.    Patient left ED via Discharge Method: ambulatory to Home with self.    Opportunity for questions and clarification provided.       Patient given 2 scripts.     Joshua Lambert RN        To continue your aftercare when you leave the hospital, you may receive an automated call from our care team to check in on how you are doing.  This is a free service and part of our promise to provide the best care and service to meet your aftercare needs.??? If you have questions, or wish to unsubscribe from this service please call [phone].  Thank you for Choosing our [place] Emergency [facility].

## 2017-08-30 NOTE — ED Triage Notes (Signed)
Pt states congested cough for a few days.

## 2017-08-30 NOTE — ED Notes (Signed)
Assumed patient care. [facility].

## 2017-08-30 NOTE — ED Provider Notes (Signed)
[facility] Emergency Department     Lee Evans is a 47 y.o. male seen on 08/30/2017 at 6:00 PM in the [facility] EMERGENCY DEPT in room HC/C.  Chief Complaint   Patient presents with   ??? Cough       HPI: 47 year old male presents to the emergency department with several days of cough.  No alleviating.  Started a few days ago.  No associated fever.  No nausea or vomiting.  No diarrhea    No sick contacts                Review of Systems: Review of Systems   Constitutional: Negative for activity change, appetite change, chills and fever.   Respiratory: Positive for cough and wheezing.    Musculoskeletal: Negative.    Psychiatric/Behavioral: Negative.        Past Medical History: Primary Care Doctor: None    Past Medical History:   Diagnosis Date   ??? Gastrointestinal disorder      History reviewed. No pertinent surgical history.  Social History     Socioeconomic History   ??? Marital status: DIVORCED     Spouse name: Not on file   ??? Number of children: Not on file   ??? Years of education: Not on file   ??? Highest education level: Not on file   Tobacco Use   ??? Smoking status: Current Every Day Smoker     Packs/day: 0.50   ??? Smokeless tobacco: Never Used   Substance and Sexual Activity   ??? Alcohol use: Yes     Comment: liter   ??? Drug use: Yes     Types: Marijuana, Methamphetamines   ??? Sexual activity: Yes     Prior to Admission Medications   Prescriptions Last Dose Informant Patient Reported? Taking?   polyethylene glycol (MIRALAX) 17 gram/dose powder   No No   Sig: Take 17 g by mouth daily. 1 tablespoon with 8 oz of water daily      Facility-Administered Medications: None     Allergies   Allergen Reactions   ??? Pcn [Penicillins] Rash         Physical Exam:  Nursing documentation reviewed.     Vitals:    08/30/17 1655   BP: 122/84   Pulse: 80   Resp: 16   Temp: 98.9 ??F (37.2 ??C)   SpO2: 97%   Vital signs were reviewed.   Physical Exam   Constitutional: He is oriented to person, place, and time. He appears  well-developed and well-nourished.   HENT:   Head: Normocephalic and atraumatic.   Cardiovascular: Normal rate and regular rhythm.   Pulmonary/Chest: No respiratory distress. He has wheezes.   Abdominal: Soft. He exhibits no distension. There is no tenderness.   Neurological: He is alert and oriented to person, place, and time.   Skin: Skin is warm and dry. Capillary refill takes less than 2 seconds.   Nursing note and vitals reviewed.      MEDICAL DECISION MAKING:  MDM  Number of Diagnoses or Management Options  Acute upper respiratory infection:   Cough:   Diagnosis management comments: 47 year old male with days of cough has scattered wheezing on exam.  Afebrile.  Oxygen saturation 99%.  He is well-appearing    Chest x-ray was performed which does not show any acute infiltrate.       Amount and/or Complexity of Data Reviewed  Tests in the radiology section of CPT??: ordered and reviewed  _________________________________________________________  ED Evaluation   Radiology studies performed:   XR CHEST PA LAT   Final Result   IMPRESSION:  NO ACUTE CARDIOPULMONARY DISEASE IDENTIFIED.          visualized by me    Orders Placed This Encounter   ??? XR CHEST PA LAT       Medications   doxycycline (VIBRAMYCIN) capsule 100 mg (100 mg Oral Given 08/30/17 1813)   predniSONE (DELTASONE) tablet 60 mg (60 mg Oral Given 08/30/17 1813)   albuterol-ipratropium (DUO-NEB) 2.5 MG-0.5 MG/3 ML (3 mL Nebulization Given 08/30/17 1819)   naproxen (NAPROSYN) tablet 500 mg (500 mg Oral Given 08/30/17 1852)     ________________________________________________________  Procedures    ======================================================  ASSESSMENT: 47 year old male with several days of cough.  Chest x-ray does not show an acute infiltrate however given his smoking history and persistent cough with wheezing will treated with doxycycline for bronchitis.    Patient's very concerned about the cough. Requesting 10 mg of something to  help with the cough.  Give him an albuterol nebulizer.  Recommend over-the-counter Delsym cough syrup.  ________________________________________________________  Condition:  good  Disposition:  home  Diagnosis:    1. Cough    2. Acute upper respiratory infection      Matt HolmesGregory R Manases Etchison, MD; 08/30/2017 @6 :00 PM================================

## 2019-06-18 ENCOUNTER — Inpatient Hospital Stay
Admit: 2019-06-18 | Discharge: 2019-06-18 | Disposition: A | Discharge status: Home / Self Care | Payer: 344 | Attending: Emergency Medicine

## 2019-06-18 ENCOUNTER — Inpatient Hospital Stay: Attending: Emergency Medicine

## 2019-06-18 DIAGNOSIS — R05 Cough: Secondary | ICD-10-CM

## 2019-06-18 LAB — STREP AG SCREEN, GROUP A
Group A Strep Ag ID: NEGATIVE
Strep A Ag: NEGATIVE

## 2019-06-18 LAB — RAPID INFLUENZA A/B ANTIGENS
Influenza A Ag: NEGATIVE
Influenza B Ag: NEGATIVE

## 2019-06-18 LAB — INFLUENZA A & B AG (RAPID TEST)
Influenza A Ag: NEGATIVE
Influenza B Ag: NEGATIVE

## 2019-06-18 MED ORDER — ACETAMINOPHEN 500 MG TAB
500 mg | ORAL | Status: DC
Start: 2019-06-18 — End: 2019-06-18

## 2019-06-18 NOTE — Progress Notes (Signed)
 06/20/19 1st attempt to notify patient of negative Covid 19 result; person answering call states you have the wrong number; no other number listed; certified letter sent to patient

## 2019-06-18 NOTE — ED Provider Notes (Signed)
ED Provider Notes by Joanie Coddington, PA at 06/18/19 1342                Author: Joanie Coddington, PA  Service: Emergency Medicine  Author Type: Physician Assistant       Filed: 06/18/19 1413  Date of Service: 06/18/19 1342  Status: Attested           Editor: Leonor Liv Charm Barges, PA (Physician Assistant)  Cosigner: Lauris Poag, MD at 06/18/19 1646          Attestation signed by Lauris Poag, MD at 06/18/19 1646          I was available for the patient and any consultation while she was here.                                 Patient is here with nasal congestion, dry cough, body aches, chills, fever, sore throat and not feeling well for  3 days. No chest pain or shortness of breath, abdominal pain, dizziness, dyspnea on exertion, orthopnea, neck pain/stiffness, rash, swelling of her arms or legs, trouble with urination or bowel movements or other symptoms.  She was ambulatory to the room  without difficulty, and well-hydrated. Others have been sick as well.  Patient states that he rode here in a [employer] bus, 9 hours and is now staying in [place] at [employer].  3 days after the bus ride he started with the above symptoms.  He is  concerned about coronavirus.            The history is provided by the patient. The history is limited by a language barrier.    Flu    This  is a new problem. The current episode started more than 2 days ago.  The problem occurs every few minutes. The problem has not changed since onset.The cough is non-productive. Patient reports a subjective fever - was not measured.Associated symptoms include  chills, rhinorrhea, sore throat and myalgias. Pertinent negatives include no chest pain, no sweats, no weight loss, no eye redness, no ear congestion, no ear pain, no headaches, no shortness of breath, no wheezing, no nausea, no vomiting and no confusion.  He has tried nothing for the symptoms. His past medical history does not include bronchitis,  pneumonia, bronchiectasis, COPD, emphysema, asthma, cancer, heart failure or CHF.             Past Medical History:        Diagnosis  Date         ?  Gastrointestinal disorder             No past surgical history on file.        No family history on file.        Social History          Socioeconomic History         ?  Marital status:  DIVORCED              Spouse name:  Not on file         ?  Number of children:  Not on file     ?  Years of education:  Not on file     ?  Highest education level:  Not on file       Occupational History        ?  Not on file  Social Needs         ?  Financial resource strain:  Not on file        ?  Food insecurity              Worry:  Not on file         Inability:  Not on file        ?  Transportation needs              Medical:  Not on file              Non-medical:  Not on file       Tobacco Use         ?  Smoking status:  Current Every Day Smoker              Packs/day:  0.50         ?  Smokeless tobacco:  Never Used       Substance and Sexual Activity         ?  Alcohol use:  Yes             Comment: liter         ?  Drug use:  Yes              Types:  Marijuana, Methamphetamines         ?  Sexual activity:  Yes       Lifestyle        ?  Physical activity              Days per week:  Not on file         Minutes per session:  Not on file         ?  Stress:  Not on file       Relationships        ?  Social Engineer, manufacturing systems on phone:  Not on file         Gets together:  Not on file         Attends religious service:  Not on file         Active member of club or organization:  Not on file         Attends meetings of clubs or organizations:  Not on file         Relationship status:  Not on file        ?  Intimate partner violence              Fear of current or ex partner:  Not on file         Emotionally abused:  Not on file         Physically abused:  Not on file         Forced sexual activity:  Not on file        Other Topics  Concern        ?  Not on file        Social History Narrative        ?  Not on file              ALLERGIES: Pcn [penicillins]      Review of Systems    Constitutional: Positive for chills. Negative for weight loss.    HENT: Positive for rhinorrhea and sore throat .  Negative for ear pain.     Eyes: Negative.  Negative for redness.    Respiratory: Positive for cough. Negative for apnea, choking, chest tightness, shortness of breath, wheezing and stridor.     Cardiovascular: Negative.  Negative for chest pain.    Gastrointestinal: Negative.  Negative for nausea and vomiting.    Genitourinary: Negative.     Musculoskeletal: Positive for myalgias.    Skin: Negative.     Neurological: Negative.  Negative for headaches.    Psychiatric/Behavioral: Negative.  Negative for confusion.    All other systems reviewed and are negative.           Vitals:          06/18/19 1322        BP:  (!) 155/99     Pulse:  100     Resp:  18     Temp:  98 ??F (36.7 ??C)     SpO2:  98%     Weight:  79.4 kg (175 lb)        Height:  5\' 10"  (1.778 m)                Physical Exam   Constitutional :        General: He is not in acute distress.     Appearance: He is well-developed. He is not toxic-appearing.    HENT:       Head: Normocephalic and atraumatic.      Right Ear: Hearing, tympanic membrane and ear canal normal.      Left Ear: Hearing, tympanic membrane and ear canal normal.      Nose: Rhinorrhea  present.      Mouth/Throat:      Mouth: Mucous membranes are moist.      Pharynx: Uvula midline.  Posterior oropharyngeal erythema present.      Tonsils: No tonsillar exudate.    Eyes:       Pupils: Pupils are equal, round, and reactive to light.   Neck :       Musculoskeletal: Normal range of motion and neck supple.   Cardiovascular :       Rate and Rhythm: Normal rate and regular rhythm.      Pulses: Normal pulses.      Heart sounds: Normal heart sounds.    Pulmonary:       Effort: Pulmonary effort is normal. No respiratory distress.      Breath sounds: Normal breath sounds. No  stridor. No wheezing, rhonchi  or rales.   Chest :       Chest wall: No tenderness.   Abdominal :      General: Bowel sounds are normal.      Palpations: Abdomen is soft.     Lymphadenopathy:       Cervical: Cervical adenopathy present.   Skin:      General: Skin is warm and dry.      Capillary Refill: Capillary refill takes less than 2 seconds.    Neurological:       Mental Status: He is alert and oriented to person, place, and time.    Psychiatric:         Behavior: Behavior normal.              MDM   Number of Diagnoses or Management Options   Risk of Complications, Morbidity, and/or Mortality   Presenting problems: moderate  Diagnostic procedures: moderate  Management options: moderate  Procedures      The patient was observed in the ED.      Results Reviewed:           Recent Results (from the past 24 hour(s))     SARS-COV-2          Collection Time: 06/18/19  1:36 PM         Result  Value  Ref Range            Specimen source  Nasopharyngeal          COVID-19 rapid test  Not detected  NOTD         SARS CoV-2  PENDING         INFLUENZA A & B AG (RAPID TEST)          Collection Time: 06/18/19  1:36 PM         Result  Value  Ref Range            Influenza A Ag  Negative  NEG         Influenza B Ag  Negative  NEG         Source  Nasopharyngeal          STREP AG SCREEN, GROUP A          Collection Time: 06/18/19  1:36 PM       Specimen: Serum; Throat         Result  Value  Ref Range            Group A Strep Ag ID  Negative  NEG          Patient appears to have a viral infection today. His vital signs are stable, and exam is unremarkable. He was encouraged to drink plenty of fluids, rest, follow-up with a primary care physician and return to the emergency room if worsening. Use medication  as directed, if OTC or prescription meds were given.   I discussed the results of all labs, procedures, radiographs, and treatments with the patient and available family.  Treatment plan is agreed upon and the patient  is ready for discharge.   All voiced understanding of the discharge plan and medication instructions or changes as appropriate.  Questions about treatment in the ED were answered.  All were encouraged to return should symptoms worsen or new problems develop.

## 2019-06-18 NOTE — ED Notes (Signed)

## 2019-06-18 NOTE — ED Notes (Signed)
Patient to ed via [facility] with mask in place. Per ems patient had a recent bus trip and states he now has dry cough, shortness of breath, fevers, headaches, body aches and loss of taste and smell. Patient states he has been feeling bad for 2-3 days.

## 2019-06-18 NOTE — ED Triage Notes (Signed)
Patient to ed via [facility] with mask in place. Per ems patient had a recent bus trip and states he now has dry cough, shortness of breath, fevers, headaches, body aches and loss of taste and smell. Patient states he has been feeling bad for 2-3 days.

## 2019-06-18 NOTE — ED Provider Notes (Signed)
Patient is here with nasal congestion, dry cough, body aches, chills, fever, sore throat and not feeling well for 3 days. No chest pain or shortness of breath, abdominal pain, dizziness, dyspnea on exertion, orthopnea, neck pain/stiffness, rash, swelling of her arms or legs, trouble with urination or bowel movements or other symptoms.  She was ambulatory to the room without difficulty, and well-hydrated. Others have been sick as well.  Patient states that he rode here in a [employer] bus, 9 hours and is now staying in [place] [employer] [facility].  3 days after the bus ride he started with the above symptoms.  He is concerned about coronavirus.        The history is provided by the patient. The history is limited by a language barrier.   Flu   This is a new problem. The current episode started more than 2 days ago. The problem occurs every few minutes. The problem has not changed since onset.The cough is non-productive. Patient reports a subjective fever - was not measured.Associated symptoms include chills, rhinorrhea, sore throat and myalgias. Pertinent negatives include no chest pain, no sweats, no weight loss, no eye redness, no ear congestion, no ear pain, no headaches, no shortness of breath, no wheezing, no nausea, no vomiting and no confusion. He has tried nothing for the symptoms. His past medical history does not include bronchitis, pneumonia, bronchiectasis, COPD, emphysema, asthma, cancer, heart failure or CHF.        Past Medical History:   Diagnosis Date   ??? Gastrointestinal disorder        No past surgical history on file.      No family history on file.    Social History     Socioeconomic History   ??? Marital status: DIVORCED     Spouse name: Not on file   ??? Number of children: Not on file   ??? Years of education: Not on file   ??? Highest education level: Not on file   Occupational History   ??? Not on file   Social Needs   ??? Financial resource strain: Not on file   ??? Food insecurity     Worry: Not on file      Inability: Not on file   ??? Transportation needs     Medical: Not on file     Non-medical: Not on file   Tobacco Use   ??? Smoking status: Current Every Day Smoker     Packs/day: 0.50   ??? Smokeless tobacco: Never Used   Substance and Sexual Activity   ??? Alcohol use: Yes     Comment: liter   ??? Drug use: Yes     Types: Marijuana, Methamphetamines   ??? Sexual activity: Yes   Lifestyle   ??? Physical activity     Days per week: Not on file     Minutes per session: Not on file   ??? Stress: Not on file   Relationships   ??? Social Wellsite geologistconnections     Talks on phone: Not on file     Gets together: Not on file     Attends religious service: Not on file     Active member of club or organization: Not on file     Attends meetings of clubs or organizations: Not on file     Relationship status: Not on file   ??? Intimate partner violence     Fear of current or ex partner: Not on file     Emotionally  abused: Not on file     Physically abused: Not on file     Forced sexual activity: Not on file   Other Topics Concern   ??? Not on file   Social History Narrative   ??? Not on file         ALLERGIES: Pcn [penicillins]    Review of Systems   Constitutional: Positive for chills. Negative for weight loss.   HENT: Positive for rhinorrhea and sore throat. Negative for ear pain.    Eyes: Negative.  Negative for redness.   Respiratory: Positive for cough. Negative for apnea, choking, chest tightness, shortness of breath, wheezing and stridor.    Cardiovascular: Negative.  Negative for chest pain.   Gastrointestinal: Negative.  Negative for nausea and vomiting.   Genitourinary: Negative.    Musculoskeletal: Positive for myalgias.   Skin: Negative.    Neurological: Negative.  Negative for headaches.   Psychiatric/Behavioral: Negative.  Negative for confusion.   All other systems reviewed and are negative.      Vitals:    06/18/19 1322   BP: (!) 155/99   Pulse: 100   Resp: 18   Temp: 98 ??F (36.7 ??C)   SpO2: 98%   Weight: 79.4 kg (175 lb)    Height: 5\' 10"  (1.778 m)            Physical Exam  Constitutional:       General: He is not in acute distress.     Appearance: He is well-developed. He is not toxic-appearing.   HENT:      Head: Normocephalic and atraumatic.      Right Ear: Hearing, tympanic membrane and ear canal normal.      Left Ear: Hearing, tympanic membrane and ear canal normal.      Nose: Rhinorrhea present.      Mouth/Throat:      Mouth: Mucous membranes are moist.      Pharynx: Uvula midline. Posterior oropharyngeal erythema present.      Tonsils: No tonsillar exudate.   Eyes:      Pupils: Pupils are equal, round, and reactive to light.   Neck:      Musculoskeletal: Normal range of motion and neck supple.   Cardiovascular:      Rate and Rhythm: Normal rate and regular rhythm.      Pulses: Normal pulses.      Heart sounds: Normal heart sounds.   Pulmonary:      Effort: Pulmonary effort is normal. No respiratory distress.      Breath sounds: Normal breath sounds. No stridor. No wheezing, rhonchi or rales.   Chest:      Chest wall: No tenderness.   Abdominal:      General: Bowel sounds are normal.      Palpations: Abdomen is soft.   Lymphadenopathy:      Cervical: Cervical adenopathy present.   Skin:     General: Skin is warm and dry.      Capillary Refill: Capillary refill takes less than 2 seconds.   Neurological:      Mental Status: He is alert and oriented to person, place, and time.   Psychiatric:         Behavior: Behavior normal.          MDM  Number of Diagnoses or Management Options  Risk of Complications, Morbidity, and/or Mortality  Presenting problems: moderate  Diagnostic procedures: moderate  Management options: moderate  Procedures    The patient was observed in the ED.    Results Reviewed:      Recent Results (from the past 24 hour(s))   SARS-COV-2    Collection Time: 06/18/19  1:36 PM   Result Value Ref Range    Specimen source Nasopharyngeal      COVID-19 rapid test Not detected NOTD      SARS CoV-2 PENDING     INFLUENZA A & B AG (RAPID TEST)    Collection Time: 06/18/19  1:36 PM   Result Value Ref Range    Influenza A Ag Negative NEG      Influenza B Ag Negative NEG      Source Nasopharyngeal     STREP AG SCREEN, GROUP A    Collection Time: 06/18/19  1:36 PM    Specimen: Serum; Throat   Result Value Ref Range    Group A Strep Ag ID Negative NEG       Patient appears to have a viral infection today. His vital signs are stable, and exam is unremarkable. He was encouraged to drink plenty of fluids, rest, follow-up with a primary care physician and return to the emergency room if worsening. Use medication as directed, if OTC or prescription meds were given.  I discussed the results of all labs, procedures, radiographs, and treatments with the patient and available family.  Treatment plan is agreed upon and the patient is ready for discharge.  All voiced understanding of the discharge plan and medication instructions or changes as appropriate.  Questions about treatment in the ED were answered.  All were encouraged to return should symptoms worsen or new problems develop.

## 2019-06-18 NOTE — ED Notes (Signed)

## 2019-06-18 NOTE — Progress Notes (Signed)
06/20/19 1st attempt to notify patient of negative Covid 19 result; person answering call states "you have the wrong number"; no other number listed; certified letter sent to patient

## 2019-06-19 LAB — COVID-19
SARS-CoV-2, Rapid: NOT DETECTED
SARS-CoV-2: NEGATIVE

## 2019-06-19 LAB — SARS-COV-2
COVID-19 rapid test: NOT DETECTED
SARS CoV-2: NEGATIVE

## 2019-06-19 NOTE — Progress Notes (Signed)
1st Attempt to contact patient for [facility] Covid 19  call, no answer on mobile number with message left on V/M requesting a return call . Will attempt to contact patient again within 24 hours

## 2019-06-19 NOTE — Progress Notes (Signed)
1st Attempt to contact patient for [facility] Covid 19  call, no answer on mobile number with message left on V/M requesting a return call . Will attempt to contact patient again within 24 hours

## 2019-06-20 LAB — CULTURE, STREP THROAT

## 2019-06-20 NOTE — Progress Notes (Signed)
2nd attempt to contact patient for [facility] Covid 19 Call. No answer, and unable to contact patient or L/M on V/M . Episode will be closed at this time as [facility] Care Coordinator has been unsuccessful in reaching the patient. Will remove myself from the care team.

## 2019-06-20 NOTE — Progress Notes (Signed)
2nd attempt to contact patient for TOC Covid 19 Call. No answer, and unable to contact patient or L/M on V/M . Episode will be closed at this time as TOC Care Coordinator has been unsuccessful in reaching the patient. Will remove myself from the care team.

## 2019-08-06 ENCOUNTER — Inpatient Hospital Stay
Admit: 2019-08-06 | Discharge: 2019-08-06 | Disposition: A | Discharge status: Home / Self Care | Attending: Emergency Medicine

## 2019-08-06 DIAGNOSIS — U071 COVID-19: Secondary | ICD-10-CM

## 2019-08-06 NOTE — ED Provider Notes (Signed)
The history is provided by the patient.   Nasal Congestion   This is a new problem. The current episode started 2 days ago. The problem occurs rarely. The problem has not changed since onset.Pertinent negatives include no chest pain, no abdominal pain, no headaches and no shortness of breath. Nothing aggravates the symptoms. Nothing relieves the symptoms. He has tried nothing for the symptoms. The treatment provided no relief.        Past Medical History:   Diagnosis Date   ??? Gastrointestinal disorder        No past surgical history on file.      No family history on file.    Social History     Socioeconomic History   ??? Marital status: DIVORCED     Spouse name: Not on file   ??? Number of children: Not on file   ??? Years of education: Not on file   ??? Highest education level: Not on file   Occupational History   ??? Not on file   Social Needs   ??? Financial resource strain: Not on file   ??? Food insecurity     Worry: Not on file     Inability: Not on file   ??? Transportation needs     Medical: Not on file     Non-medical: Not on file   Tobacco Use   ??? Smoking status: Current Every Day Smoker     Packs/day: 0.50   ??? Smokeless tobacco: Never Used   Substance and Sexual Activity   ??? Alcohol use: Yes     Comment: liter   ??? Drug use: Yes     Types: Marijuana, Methamphetamines   ??? Sexual activity: Yes   Lifestyle   ??? Physical activity     Days per week: Not on file     Minutes per session: Not on file   ??? Stress: Not on file   Relationships   ??? Social Product manager on phone: Not on file     Gets together: Not on file     Attends religious service: Not on file     Active member of club or organization: Not on file     Attends meetings of clubs or organizations: Not on file     Relationship status: Not on file   ??? Intimate partner violence     Fear of current or ex partner: Not on file     Emotionally abused: Not on file     Physically abused: Not on file     Forced sexual activity: Not on file   Other Topics Concern   ??? Not  on file   Social History Narrative   ??? Not on file         ALLERGIES: Pcn [penicillins]    Review of Systems   Respiratory: Negative for shortness of breath.    Cardiovascular: Negative for chest pain.   Gastrointestinal: Negative for abdominal pain.   Neurological: Negative for headaches.   All other systems reviewed and are negative.      Vitals:    08/06/19 1008   BP: 123/69   Pulse: 86   Resp: 20   Temp: 97.7 ??F (36.5 ??C)   SpO2: 97%   Weight: 79.4 kg (175 lb)   Height: 5\' 10"  (1.778 m)            Physical Exam  Vitals signs and nursing note reviewed.   Constitutional:  Appearance: He is well-developed.   HENT:      Head: Normocephalic and atraumatic.   Eyes:      Conjunctiva/sclera: Conjunctivae normal.      Pupils: Pupils are equal, round, and reactive to light.   Neck:      Musculoskeletal: Normal range of motion and neck supple.   Cardiovascular:      Rate and Rhythm: Normal rate and regular rhythm.      Heart sounds: Normal heart sounds.   Pulmonary:      Effort: Pulmonary effort is normal.      Breath sounds: Normal breath sounds.   Abdominal:      General: Bowel sounds are normal.      Palpations: Abdomen is soft.   Musculoskeletal: Normal range of motion.         General: No deformity.   Skin:     General: Skin is warm and dry.   Neurological:      Mental Status: He is alert and oriented to person, place, and time.      Cranial Nerves: No cranial nerve deficit.   Psychiatric:         Behavior: Behavior normal.          MDM  Number of Diagnoses or Management Options  Encounter for screening laboratory testing for COVID-19 virus in asymptomatic patient:   Diagnosis management comments: 48 year old male presenting for evaluation for COVID-19.  We will swab the patient and give the patient resources as he is wanting a rapid test.       Amount and/or Complexity of Data Reviewed  Clinical lab tests: ordered    Risk of Complications, Morbidity, and/or Mortality  Presenting problems: low  Diagnostic  procedures: low  Management options: low    Patient Progress  Patient progress: stable         Procedures

## 2019-08-06 NOTE — ED Notes (Signed)
Patient ambulatory to triage with mask in place. Patient reports chills, cough, body aches. Denies loss of taste or smell. Pt reports one of his coworkers recently tested positive.

## 2019-08-06 NOTE — Progress Notes (Signed)
08/08/19 Patient notified of positive Covid 19 result; home care instructions discussed; questions answered

## 2019-08-06 NOTE — ED Notes (Signed)
I have reviewed discharge instructions with the patient.  The patient verbalized understanding.    Patient left ED via Discharge Method: ambulatory to home with self.    Opportunity for questions and clarification provided.       Patient given 0 scripts.         To continue your aftercare when you leave the hospital, you may receive an automated call from our care team to check in on how you are doing.  This is a free service and part of our promise to provide the best care and service to meet your aftercare needs." If you have questions, or wish to unsubscribe from this service please call [phone].  Thank you for Choosing our [facility].

## 2019-08-06 NOTE — ED Provider Notes (Signed)
The history is provided by the patient.   Nasal Congestion   This is a new problem. The current episode started 2 days ago. The problem occurs rarely. The problem has not changed since onset.Pertinent negatives include no chest pain, no abdominal pain, no headaches and no shortness of breath. Nothing aggravates the symptoms. Nothing relieves the symptoms. He has tried nothing for the symptoms. The treatment provided no relief.        Past Medical History:   Diagnosis Date   ??? Gastrointestinal disorder        No past surgical history on file.      No family history on file.    Social History     Socioeconomic History   ??? Marital status: DIVORCED     Spouse name: Not on file   ??? Number of children: Not on file   ??? Years of education: Not on file   ??? Highest education level: Not on file   Occupational History   ??? Not on file   Social Needs   ??? Financial resource strain: Not on file   ??? Food insecurity     Worry: Not on file     Inability: Not on file   ??? Transportation needs     Medical: Not on file     Non-medical: Not on file   Tobacco Use   ??? Smoking status: Current Every Day Smoker     Packs/day: 0.50   ??? Smokeless tobacco: Never Used   Substance and Sexual Activity   ??? Alcohol use: Yes     Comment: liter   ??? Drug use: Yes     Types: Marijuana, Methamphetamines   ??? Sexual activity: Yes   Lifestyle   ??? Physical activity     Days per week: Not on file     Minutes per session: Not on file   ??? Stress: Not on file   Relationships   ??? Social Wellsite geologist on phone: Not on file     Gets together: Not on file     Attends religious service: Not on file     Active member of club or organization: Not on file     Attends meetings of clubs or organizations: Not on file     Relationship status: Not on file   ??? Intimate partner violence     Fear of current or ex partner: Not on file     Emotionally abused: Not on file     Physically abused: Not on file     Forced sexual activity: Not on file   Other Topics Concern    ??? Not on file   Social History Narrative   ??? Not on file         ALLERGIES: Pcn [penicillins]    Review of Systems   Respiratory: Negative for shortness of breath.    Cardiovascular: Negative for chest pain.   Gastrointestinal: Negative for abdominal pain.   Neurological: Negative for headaches.   All other systems reviewed and are negative.      Vitals:    08/06/19 1008   BP: 123/69   Pulse: 86   Resp: 20   Temp: 97.7 ??F (36.5 ??C)   SpO2: 97%   Weight: 79.4 kg (175 lb)   Height: 5\' 10"  (1.778 m)            Physical Exam  Vitals signs and nursing note reviewed.   Constitutional:  Appearance: He is well-developed.   HENT:      Head: Normocephalic and atraumatic.   Eyes:      Conjunctiva/sclera: Conjunctivae normal.      Pupils: Pupils are equal, round, and reactive to light.   Neck:      Musculoskeletal: Normal range of motion and neck supple.   Cardiovascular:      Rate and Rhythm: Normal rate and regular rhythm.      Heart sounds: Normal heart sounds.   Pulmonary:      Effort: Pulmonary effort is normal.      Breath sounds: Normal breath sounds.   Abdominal:      General: Bowel sounds are normal.      Palpations: Abdomen is soft.   Musculoskeletal: Normal range of motion.         General: No deformity.   Skin:     General: Skin is warm and dry.   Neurological:      Mental Status: He is alert and oriented to person, place, and time.      Cranial Nerves: No cranial nerve deficit.   Psychiatric:         Behavior: Behavior normal.          MDM  Number of Diagnoses or Management Options  Encounter for screening laboratory testing for COVID-19 virus in asymptomatic patient:   Diagnosis management comments: 48 year old male presenting for evaluation for COVID-19.  We will swab the patient and give the patient resources as he is wanting a rapid test.       Amount and/or Complexity of Data Reviewed  Clinical lab tests: ordered    Risk of Complications, Morbidity, and/or Mortality  Presenting problems: low   Diagnostic procedures: low  Management options: low    Patient Progress  Patient progress: stable         Procedures

## 2019-08-06 NOTE — ED Triage Notes (Signed)
Patient ambulatory to triage with mask in place. Patient reports chills, cough, body aches. Denies loss of taste or smell. Pt reports one of his coworkers recently tested positive.

## 2019-08-06 NOTE — ED Notes (Signed)

## 2019-08-06 NOTE — Progress Notes (Signed)
08/08/19 Patient notified of positive Covid 19 result; home care instructions discussed; questions answered

## 2019-08-07 LAB — COVID-19: SARS-CoV-2: POSITIVE — AB

## 2019-08-07 LAB — SARS-COV-2: SARS CoV-2: POSITIVE — AB

## 2019-08-08 ENCOUNTER — Emergency Department: Admit: 2019-08-08 | Payer: 344

## 2019-08-08 ENCOUNTER — Inpatient Hospital Stay
Admit: 2019-08-08 | Discharge: 2019-08-08 | Disposition: A | Discharge status: Home / Self Care | Payer: 344 | Attending: Emergency Medicine

## 2019-08-08 DIAGNOSIS — U071 COVID-19: Secondary | ICD-10-CM

## 2019-08-08 LAB — CBC WITH AUTO DIFFERENTIAL
Basophils %: 1 % (ref 0.0–2.0)
Basophils Absolute: 0 10*3/uL (ref 0.0–0.2)
Eosinophils %: 2 % (ref 0.5–7.8)
Eosinophils Absolute: 0.1 10*3/uL (ref 0.0–0.8)
Granulocyte Absolute Count: 0 10*3/uL (ref 0.0–0.5)
Hematocrit: 44.2 % (ref 41.1–50.3)
Hemoglobin: 15.1 g/dL (ref 13.6–17.2)
Immature Granulocytes: 0 % (ref 0.0–5.0)
Lymphocytes %: 29 % (ref 13–44)
Lymphocytes Absolute: 1 10*3/uL (ref 0.5–4.6)
MCH: 30.1 PG (ref 26.1–32.9)
MCHC: 34.2 g/dL (ref 31.4–35.0)
MCV: 88 FL (ref 79.6–97.8)
MPV: 9.7 FL (ref 9.4–12.3)
Monocytes %: 10 % (ref 4.0–12.0)
Monocytes Absolute: 0.4 10*3/uL (ref 0.1–1.3)
NRBC Absolute: 0 10*3/uL (ref 0.0–0.2)
Neutrophils %: 59 % (ref 43–78)
Neutrophils Absolute: 2 10*3/uL (ref 1.7–8.2)
Platelets: 237 10*3/uL (ref 150–450)
RBC: 5.02 M/uL (ref 4.23–5.6)
RDW: 12.5 % (ref 11.9–14.6)
WBC: 3.5 10*3/uL — ABNORMAL LOW (ref 4.3–11.1)

## 2019-08-08 LAB — COMPREHENSIVE METABOLIC PANEL
ALT: 14 U/L (ref 12–65)
AST: 9 U/L — ABNORMAL LOW (ref 15–37)
Albumin/Globulin Ratio: 1 — ABNORMAL LOW (ref 1.2–3.5)
Albumin: 3.8 g/dL (ref 3.5–5.0)
Alkaline Phosphatase: 73 U/L (ref 50–136)
Anion Gap: 6 mmol/L — ABNORMAL LOW (ref 7–16)
BUN: 13 MG/DL (ref 6–23)
CO2: 26 mmol/L (ref 21–32)
Calcium: 8.6 MG/DL (ref 8.3–10.4)
Chloride: 108 mmol/L — ABNORMAL HIGH (ref 98–107)
Creatinine: 1.03 MG/DL (ref 0.8–1.5)
EGFR IF NonAfrican American: >60 mL/min/{1.73_m2} (ref >60)
GFR African American: >60 mL/min/{1.73_m2} (ref >60)
Globulin: 3.8 g/dL — ABNORMAL HIGH (ref 2.3–3.5)
Glucose: 104 mg/dL — ABNORMAL HIGH (ref 65–100)
Potassium: 3.8 mmol/L (ref 3.5–5.1)
Sodium: 140 mmol/L (ref 136–145)
Total Bilirubin: 0.2 MG/DL (ref 0.2–1.1)
Total Protein: 7.6 g/dL (ref 6.3–8.2)

## 2019-08-08 LAB — TROPONIN, HIGH SENSITIVITY: Troponin, High Sensitivity: 6.2 pg/mL (ref 0–14)

## 2019-08-08 LAB — CBC WITH AUTOMATED DIFF
ABS. BASOPHILS: 0 10*3/uL (ref 0.0–0.2)
ABS. EOSINOPHILS: 0.1 10*3/uL (ref 0.0–0.8)
ABS. IMM. GRANS.: 0 10*3/uL (ref 0.0–0.5)
ABS. LYMPHOCYTES: 1 10*3/uL (ref 0.5–4.6)
ABS. MONOCYTES: 0.4 10*3/uL (ref 0.1–1.3)
ABS. NEUTROPHILS: 2 10*3/uL (ref 1.7–8.2)
ABSOLUTE NRBC: 0 10*3/uL (ref 0.0–0.2)
BASOPHILS: 1 % (ref 0.0–2.0)
EOSINOPHILS: 2 % (ref 0.5–7.8)
HCT: 44.2 % (ref 41.1–50.3)
HGB: 15.1 g/dL (ref 13.6–17.2)
IMMATURE GRANULOCYTES: 0 % (ref 0.0–5.0)
LYMPHOCYTES: 29 % (ref 13–44)
MCH: 30.1 PG (ref 26.1–32.9)
MCHC: 34.2 g/dL (ref 31.4–35.0)
MCV: 88 FL (ref 79.6–97.8)
MONOCYTES: 10 % (ref 4.0–12.0)
MPV: 9.7 FL (ref 9.4–12.3)
NEUTROPHILS: 59 % (ref 43–78)
PLATELET: 237 10*3/uL (ref 150–450)
RBC: 5.02 M/uL (ref 4.23–5.6)
RDW: 12.5 % (ref 11.9–14.6)
WBC: 3.5 10*3/uL — ABNORMAL LOW (ref 4.3–11.1)

## 2019-08-08 LAB — METABOLIC PANEL, COMPREHENSIVE
A-G Ratio: 1 — ABNORMAL LOW (ref 1.2–3.5)
ALT (SGPT): 14 U/L (ref 12–65)
AST (SGOT): 9 U/L — ABNORMAL LOW (ref 15–37)
Albumin: 3.8 g/dL (ref 3.5–5.0)
Alk. phosphatase: 73 U/L (ref 50–136)
Anion gap: 6 mmol/L — ABNORMAL LOW (ref 7–16)
BUN: 13 MG/DL (ref 6–23)
Bilirubin, total: 0.2 MG/DL (ref 0.2–1.1)
CO2: 26 mmol/L (ref 21–32)
Calcium: 8.6 MG/DL (ref 8.3–10.4)
Chloride: 108 mmol/L — ABNORMAL HIGH (ref 98–107)
Creatinine: 1.03 MG/DL (ref 0.8–1.5)
GFR est AA: >60 mL/min/{1.73_m2} (ref >60)
GFR est non-AA: >60 mL/min/{1.73_m2} (ref >60)
Globulin: 3.8 g/dL — ABNORMAL HIGH (ref 2.3–3.5)
Glucose: 104 mg/dL — ABNORMAL HIGH (ref 65–100)
Potassium: 3.8 mmol/L (ref 3.5–5.1)
Protein, total: 7.6 g/dL (ref 6.3–8.2)
Sodium: 140 mmol/L (ref 136–145)

## 2019-08-08 LAB — TROPONIN-HIGH SENSITIVITY: Troponin-High Sensitivity: 6.2 pg/mL (ref 0–14)

## 2019-08-08 MED ORDER — KETOROLAC TROMETHAMINE 15 MG/ML INJECTION
15 mg/mL | INTRAMUSCULAR | Status: AC
Start: 2019-08-08 — End: 2019-08-08
  Administered 2019-08-08: 22:00:00 via INTRAVENOUS

## 2019-08-08 MED FILL — KETOROLAC TROMETHAMINE 15 MG/ML INJECTION: 15 mg/mL | INTRAMUSCULAR | Qty: 1

## 2019-08-08 NOTE — Progress Notes (Signed)
 Patient contacted regarding recent visit for viral symptoms.    Outreach made within 2 business days of discharge: Yes    This Chartered loss adjuster contacted the patient by telephone to perform post discharge call. Verified name and DOB with patient as identifiers. Provided introduction to self, and reason for call due to viral symptoms of infection and/or exposure to COVID-19.   Discussed COVID-19 related testing which was previously given. Test results were positive. Patient informed of results, if available? no    Advance Care Planning:   Does patient have an Advance Directive: Reviewed and current    Patient presented to emergency department/flu clinic with complaints of viral symptoms/exposure to COVID. Patient reports symptoms are unchanged. Due to no new or worsening symptoms the RN CTN/ACM was not notified for escalation.   Discussed exposure protocols and quarantine with CDC Guidelines What To Do If You Are Sick    patient was given an opportunity for questions and concerns.     Stay home except to get medical care  Separate yourself from other people and animals in your home  Call ahead before visiting your doctor  Wear a facemask  Cover your coughs and sneezes  Clean your hands often  Avoid sharing personal household items  Clean all "high-touch" surfaces everyday    Monitor your symptoms  Seek prompt medical attention if your illness is worsening (e.g., difficulty breathing). Before seeking care, call your healthcare provider and tell them that you have, or are being evaluated for, COVID-19. Put on a facemask before you enter the facility. These steps will help the healthcare provider's office to keep other people in the office or waiting room from getting infected or exposed. Ask your healthcare provider to call the local or state health department. Persons who are placed under If you have a medical emergency and need to call 911, notify the dispatch personnel that you have, or are being evaluated for COVID-19. If  possible, put on a facemask before emergency medical services arrive.    The patient agrees to contact the [facility] exposure line [phone], local health department [facility] and PCP office for questions related to their healthcare. Chartered loss adjuster provided contact information for future reference.    Patient/family/caregiver given information for [employer] and agrees to enroll No    Patient shared concerns as he is homeless and unable to stay in shelters with COVID-19 diagnosis. After conferring with Case manager/ [facility] [place]  [place], the patient was given the phone number for [employer] who may provide housing assistance for the patient.

## 2019-08-08 NOTE — ED Provider Notes (Signed)
Patient is a 48 year [place] complaining of body aches, intermittent low-grade fever decreased taste and smell, recent positive COVID-19 test.  Patient denies significant shortness of breath.  Denies nausea, vomiting or diarrhea.  Denies abdominal pain.  Patient dates he is homeless, stays currently at [facility].  Patient was seen 2 days ago here in the emergency department for the same symptoms, seen yesterday at [facility] health.           Past Medical History:   Diagnosis Date   ??? Gastrointestinal disorder        No past surgical history on file.      No family history on file.    Social History     Socioeconomic History   ??? Marital status: DIVORCED     Spouse name: Not on file   ??? Number of children: Not on file   ??? Years of education: Not on file   ??? Highest education level: Not on file   Occupational History   ??? Not on file   Social Needs   ??? Financial resource strain: Not on file   ??? Food insecurity     Worry: Not on file     Inability: Not on file   ??? Transportation needs     Medical: Not on file     Non-medical: Not on file   Tobacco Use   ??? Smoking status: Current Every Day Smoker     Packs/day: 0.50   ??? Smokeless tobacco: Never Used   Substance and Sexual Activity   ??? Alcohol use: Yes     Comment: liter   ??? Drug use: Yes     Types: Marijuana, Methamphetamines   ??? Sexual activity: Yes   Lifestyle   ??? Physical activity     Days per week: Not on file     Minutes per session: Not on file   ??? Stress: Not on file   Relationships   ??? Social Wellsite geologist on phone: Not on file     Gets together: Not on file     Attends religious service: Not on file     Active member of club or organization: Not on file     Attends meetings of clubs or organizations: Not on file     Relationship status: Not on file   ??? Intimate partner violence     Fear of current or ex partner: Not on file     Emotionally abused: Not on file     Physically abused: Not on file     Forced sexual  activity: Not on file   Other Topics Concern   ??? Not on file   Social History Narrative   ??? Not on file         ALLERGIES: Pcn [penicillins]    Review of Systems   Constitutional: Negative for chills and fever.   HENT: Negative for congestion, rhinorrhea, sinus pressure and sore throat.    Eyes: Negative for visual disturbance.   Respiratory: Positive for cough. Negative for shortness of breath.    Cardiovascular: Negative for chest pain.   Gastrointestinal: Negative for abdominal pain, blood in stool, diarrhea, nausea and vomiting.   Endocrine: Negative for polyuria.   Genitourinary: Negative for difficulty urinating and dysuria.   Musculoskeletal: Positive for myalgias. Negative for arthralgias, neck pain and neck stiffness.   Skin: Negative for color change and rash.   Neurological: Negative for syncope, speech  difficulty, weakness, numbness and headaches.   Psychiatric/Behavioral: Negative for behavioral problems, confusion and suicidal ideas.   All other systems reviewed and are negative.      Vitals:    08/08/19 1517   BP: (!) 160/110   Pulse: 79   Resp: 18   Temp: 97.9 ??F (36.6 ??C)   SpO2: 97%   Weight: 79.4 kg (175 lb)   Height: 5\' 9"  (1.753 m)            Physical Exam  Vitals signs and nursing note reviewed.   Constitutional:       Appearance: Normal appearance.   HENT:      Head: Normocephalic and atraumatic.      Nose: Nose normal.      Mouth/Throat:      Mouth: Mucous membranes are moist.   Eyes:      Extraocular Movements: Extraocular movements intact.   Neck:      Musculoskeletal: Normal range of motion.   Cardiovascular:      Rate and Rhythm: Normal rate and regular rhythm.      Heart sounds: Normal heart sounds.   Pulmonary:      Effort: Pulmonary effort is normal.      Breath sounds: Normal breath sounds. No wheezing, rhonchi or rales.   Abdominal:      General: Abdomen is flat.      Palpations: Abdomen is soft.      Tenderness: There is no abdominal tenderness. There is no guarding.    Musculoskeletal: Normal range of motion.   Skin:     General: Skin is warm and dry.   Neurological:      General: No focal deficit present.      Mental Status: He is alert and oriented to person, place, and time.   Psychiatric:         Mood and Affect: Mood normal.          MDM  Number of Diagnoses or Management Options  Body aches:   COVID-19 virus infection:   Diagnosis management comments: Patient was seen wearing appropriate PPE.  Complains of body aches, chest soreness, cough, congestion.  Patient will be given Toradol for body aches.  Due to the nature of his chief complaint, a broad-based work-up was ordered.  Currently normal O2 saturation normal heart rate.  Patient not requiring supplemental oxygenation.  Chest x-ray shows no evidence of acute abnormality.  Patient's white counts 3.5 consistent with viral suppression, stable H&H, Normal kidney function and grossly normal electrolytes, troponin is normal.  Sinus rhythm, rate 92, PR 158, QRS 78, QTc 410, normal axis, no significant ST elevation or depression.  Patient has remained stable, discharged home this time.  Advised to follow-up with free clinic as needed.  Alternate Tylenol Motrin for symptoms.  Return to the ER for worsening or worrisome symptoms.         Procedures

## 2019-08-08 NOTE — ED Notes (Signed)

## 2019-08-08 NOTE — ED Notes (Signed)
PT arrives per EMS. Pt continues to complains of weakness SOB. Tested positive for covid on Tuesday. Pt states he is homeless and requested to talk to social work to see if they can get him into a shelter.

## 2019-08-08 NOTE — ED Triage Notes (Addendum)
PT arrives per EMS. Pt continues to complains of weakness SOB. Tested positive for covid on Tuesday. Pt states he is homeless and requested to talk to social work to see if they can get him into a shelter.

## 2019-08-08 NOTE — Progress Notes (Signed)
Patient contacted regarding recent visit for viral symptoms.    Outreach made within 2 business days of discharge: Yes    This Chief Strategy Officer contacted the patient by telephone to perform post discharge call. Verified name and DOB with patient as identifiers. Provided introduction to self, and reason for call due to viral symptoms of infection and/or exposure to COVID-19.   Discussed COVID-19 related testing which was previously given. Test results were positive. Patient informed of results, if available? no    Advance Care Planning:   Does patient have an Advance Directive: Reviewed and current    Patient presented to emergency department/flu clinic with complaints of viral symptoms/exposure to COVID. Patient reports symptoms are unchanged. Due to no new or worsening symptoms the RN CTN/ACM was not notified for escalation.   Discussed exposure protocols and quarantine with CDC Guidelines What To Do If You Are Sick    patient was given an opportunity for questions and concerns.     Stay home except to get medical care  Separate yourself from other people and animals in your home  Call ahead before visiting your doctor  Wear a facemask  Cover your coughs and sneezes  Clean your hands often  Avoid sharing personal household items  Clean all ???high-touch??? surfaces everyday    Monitor your symptoms  Seek prompt medical attention if your illness is worsening (e.g., difficulty breathing). Before seeking care, call your healthcare provider and tell them that you have, or are being evaluated for, COVID-19. Put on a facemask before you enter the facility. These steps will help the healthcare provider's office to keep other people in the office or waiting room from getting infected or exposed. Ask your healthcare provider to call the local or state health department. Persons who are placed under If you have a medical emergency and need to call 911, notify the dispatch  personnel that you have, or are being evaluated for COVID-19. If possible, put on a facemask before emergency medical services arrive.    The patient agrees to contact the [facility] exposure line [phone], local health department [facility] and PCP office for questions related to their healthcare. Chief Strategy Officer provided contact information for future reference.    Patient/family/caregiver given information for [employer] and agrees to enroll No    Patient shared concerns as he is homeless and unable to stay in shelters with COVID-19 diagnosis. After conferring with Case manager/ [facility], the patient was given the phone number for [employer] who may provide housing assistance for the patient.

## 2019-08-08 NOTE — ED Provider Notes (Addendum)
Patient is a 48 year [place] complaining of body aches, intermittent low-grade fever decreased taste and smell, recent positive COVID-19 test.  Patient denies significant shortness of breath.  Denies nausea, vomiting or diarrhea.  Denies abdominal pain.  Patient dates he is homeless, stays currently at [facility].  Patient was seen 2 days ago here in the emergency department for the same symptoms, seen yesterday at [facility] health.           Past Medical History:   Diagnosis Date   ??? Gastrointestinal disorder        No past surgical history on file.      No family history on file.    Social History     Socioeconomic History   ??? Marital status: DIVORCED     Spouse name: Not on file   ??? Number of children: Not on file   ??? Years of education: Not on file   ??? Highest education level: Not on file   Occupational History   ??? Not on file   Social Needs   ??? Financial resource strain: Not on file   ??? Food insecurity     Worry: Not on file     Inability: Not on file   ??? Transportation needs     Medical: Not on file     Non-medical: Not on file   Tobacco Use   ??? Smoking status: Current Every Day Smoker     Packs/day: 0.50   ??? Smokeless tobacco: Never Used   Substance and Sexual Activity   ??? Alcohol use: Yes     Comment: liter   ??? Drug use: Yes     Types: Marijuana, Methamphetamines   ??? Sexual activity: Yes   Lifestyle   ??? Physical activity     Days per week: Not on file     Minutes per session: Not on file   ??? Stress: Not on file   Relationships   ??? Social Wellsite geologist on phone: Not on file     Gets together: Not on file     Attends religious service: Not on file     Active member of club or organization: Not on file     Attends meetings of clubs or organizations: Not on file     Relationship status: Not on file   ??? Intimate partner violence     Fear of current or ex partner: Not on file     Emotionally abused: Not on file     Physically abused: Not on file      Forced sexual activity: Not on file   Other Topics Concern   ??? Not on file   Social History Narrative   ??? Not on file         ALLERGIES: Pcn [penicillins]    Review of Systems   Constitutional: Negative for chills and fever.   HENT: Negative for congestion, rhinorrhea, sinus pressure and sore throat.    Eyes: Negative for visual disturbance.   Respiratory: Positive for cough. Negative for shortness of breath.    Cardiovascular: Negative for chest pain.   Gastrointestinal: Negative for abdominal pain, blood in stool, diarrhea, nausea and vomiting.   Endocrine: Negative for polyuria.   Genitourinary: Negative for difficulty urinating and dysuria.   Musculoskeletal: Positive for myalgias. Negative for arthralgias, neck pain and neck stiffness.   Skin: Negative for color change and rash.   Neurological: Negative for syncope, speech  difficulty, weakness, numbness and headaches.   Psychiatric/Behavioral: Negative for behavioral problems, confusion and suicidal ideas.   All other systems reviewed and are negative.      Vitals:    08/08/19 1517   BP: (!) 160/110   Pulse: 79   Resp: 18   Temp: 97.9 ??F (36.6 ??C)   SpO2: 97%   Weight: 79.4 kg (175 lb)   Height: 5\' 9"  (1.753 m)            Physical Exam  Vitals signs and nursing note reviewed.   Constitutional:       Appearance: Normal appearance.   HENT:      Head: Normocephalic and atraumatic.      Nose: Nose normal.      Mouth/Throat:      Mouth: Mucous membranes are moist.   Eyes:      Extraocular Movements: Extraocular movements intact.   Neck:      Musculoskeletal: Normal range of motion.   Cardiovascular:      Rate and Rhythm: Normal rate and regular rhythm.      Heart sounds: Normal heart sounds.   Pulmonary:      Effort: Pulmonary effort is normal.      Breath sounds: Normal breath sounds. No wheezing, rhonchi or rales.   Abdominal:      General: Abdomen is flat.      Palpations: Abdomen is soft.       Tenderness: There is no abdominal tenderness. There is no guarding.   Musculoskeletal: Normal range of motion.   Skin:     General: Skin is warm and dry.   Neurological:      General: No focal deficit present.      Mental Status: He is alert and oriented to person, place, and time.   Psychiatric:         Mood and Affect: Mood normal.          MDM  Number of Diagnoses or Management Options  Body aches:   COVID-19 virus infection:   Diagnosis management comments: Patient was seen wearing appropriate PPE.  Complains of body aches, chest soreness, cough, congestion.  Patient will be given Toradol for body aches.  Due to the nature of his chief complaint, a broad-based work-up was ordered.  Currently normal O2 saturation normal heart rate.  Patient not requiring supplemental oxygenation.  Chest x-ray shows no evidence of acute abnormality.  Patient's white counts 3.5 consistent with viral suppression, stable H&H, Normal kidney function and grossly normal electrolytes, troponin is normal.  Sinus rhythm, rate 92, PR 158, QRS 78, QTc 410, normal axis, no significant ST elevation or depression.  Patient has remained stable, discharged home this time.  Advised to follow-up with free clinic as needed.  Alternate Tylenol Motrin for symptoms.  Return to the ER for worsening or worrisome symptoms.         Procedures

## 2019-08-08 NOTE — ED Notes (Signed)

## 2019-08-09 LAB — EKG 12-LEAD
Atrial Rate: 92 {beats}/min
P Axis: 69 degrees
P-R Interval: 158 ms
Q-T Interval: 332 ms
QRS Duration: 78 ms
QTc Calculation (Bazett): 410 ms
R Axis: -4 degrees
T Axis: 67 degrees
Ventricular Rate: 92 {beats}/min

## 2019-08-09 LAB — EKG, 12 LEAD, INITIAL
Atrial Rate: 92 {beats}/min
Calculated P Axis: 69 degrees
Calculated R Axis: -4 degrees
Calculated T Axis: 67 degrees
P-R Interval: 158 ms
Q-T Interval: 332 ms
QRS Duration: 78 ms
QTC Calculation (Bezet): 410 ms
Ventricular Rate: 92 {beats}/min

## 2019-08-09 NOTE — Progress Notes (Signed)
 Patient contacted regarding recent visit for viral symptoms.    Outreach made within 2 business days of discharge: Yes    This Chartered loss adjuster contacted the patient by telephone to perform post discharge call. Verified name and DOB with patient as identifiers. Provided introduction to self, and reason for call due to viral symptoms of infection and/or exposure to COVID-19.   Discussed COVID-19 related testing which was previously given. Test results were positive. Patient informed of results, if available? No     Advance Care Planning:   Does patient have an Advance Directive: Reviewed and current    Patient presented to emergency department/flu clinic with complaints of viral symptoms/exposure to COVID. Patient reports symptoms are improving. Due to No new or worsening symptoms the RN CTN/ACM was not notified for escalation.   Discussed exposure protocols and quarantine with CDC Guidelines What To Do If You Are Sick    patient was given an opportunity for questions and concerns.     Stay home except to get medical care  Separate yourself from other people and animals in your home  Call ahead before visiting your doctor  Wear a facemask  Cover your coughs and sneezes  Clean your hands often  Avoid sharing personal household items  Clean all "high-touch" surfaces everyday    Monitor your symptoms  Seek prompt medical attention if your illness is worsening (e.g., difficulty breathing). Before seeking care, call your healthcare provider and tell them that you have, or are being evaluated for, COVID-19. Put on a facemask before you enter the facility. These steps will help the healthcare provider's office to keep other people in the office or waiting room from getting infected or exposed. Ask your healthcare provider to call the local or state health department. Persons who are placed under If you have a medical emergency and need to call 911, notify the dispatch personnel that you have, or are being evaluated for COVID-19. If  possible, put on a facemask before emergency medical services arrive.    The patient agrees to contact the [facility] exposure line [phone], local health department [facility] and PCP office for questions related to their healthcare. Chartered loss adjuster provided contact information for future reference.    Patient/family/caregiver given information for [employer] and agrees to enroll No    Patient has secure short term housing at [employer]

## 2019-08-09 NOTE — Progress Notes (Signed)
Patient contacted regarding recent visit for viral symptoms.    Outreach made within 2 business days of discharge: Yes    This Chief Strategy Officer contacted the patient by telephone to perform post discharge call. Verified name and DOB with patient as identifiers. Provided introduction to self, and reason for call due to viral symptoms of infection and/or exposure to COVID-19.   Discussed COVID-19 related testing which was previously given. Test results were positive. Patient informed of results, if available? No     Advance Care Planning:   Does patient have an Advance Directive: Reviewed and current    Patient presented to emergency department/flu clinic with complaints of viral symptoms/exposure to COVID. Patient reports symptoms are improving. Due to No new or worsening symptoms the RN CTN/ACM was not notified for escalation.   Discussed exposure protocols and quarantine with CDC Guidelines What To Do If You Are Sick    patient was given an opportunity for questions and concerns.     Stay home except to get medical care  Separate yourself from other people and animals in your home  Call ahead before visiting your doctor  Wear a facemask  Cover your coughs and sneezes  Clean your hands often  Avoid sharing personal household items  Clean all ???high-touch??? surfaces everyday    Monitor your symptoms  Seek prompt medical attention if your illness is worsening (e.g., difficulty breathing). Before seeking care, call your healthcare provider and tell them that you have, or are being evaluated for, COVID-19. Put on a facemask before you enter the facility. These steps will help the healthcare provider's office to keep other people in the office or waiting room from getting infected or exposed. Ask your healthcare provider to call the local or state health department. Persons who are placed under If you have a medical emergency and need to call 911, notify the dispatch  personnel that you have, or are being evaluated for COVID-19. If possible, put on a facemask before emergency medical services arrive.    The patient agrees to contact the [facility] exposure line [phone], local health department [facility] and PCP office for questions related to their healthcare. Chief Strategy Officer provided contact information for future reference.    Patient/family/caregiver given information for [employer] and agrees to enroll No    Patient has secure short term housing at [employer]

## 2021-10-07 ENCOUNTER — Inpatient Hospital Stay
Admit: 2021-10-07 | Discharge: 2021-10-07 | Disposition: A | Discharge status: Home / Self Care | Payer: PRIVATE HEALTH INSURANCE | Attending: Emergency Medicine

## 2021-10-07 DIAGNOSIS — F1193 Opioid use, unspecified with withdrawal: Principal | ICD-10-CM

## 2021-10-07 MED ORDER — CHLORDIAZEPOXIDE HCL 10 MG PO CAPS
10 MG | ORAL_CAPSULE | Freq: Three times a day (TID) | ORAL | 0 refills | Status: AC | PRN
Start: 2021-10-07 — End: 2021-10-12

## 2021-10-07 MED ORDER — CLONAZEPAM 1 MG PO TABS
1 MG | ORAL_TABLET | ORAL | 0 refills | Status: DC
Start: 2021-10-07 — End: 2021-10-07

## 2021-10-07 MED ORDER — DICYCLOMINE HCL 20 MG PO TABS
20 MG | ORAL_TABLET | Freq: Three times a day (TID) | ORAL | 0 refills | Status: AC | PRN
Start: 2021-10-07 — End: ?

## 2021-10-07 MED ORDER — ONDANSETRON 4 MG PO TBDP
4 MG | ORAL_TABLET | Freq: Three times a day (TID) | ORAL | 0 refills | Status: AC | PRN
Start: 2021-10-07 — End: ?

## 2021-10-07 NOTE — ED Notes (Signed)
I have reviewed discharge instructions with the patient.  The patient verbalized understanding.    Patient left ED via Discharge Method: ambulatory to Home with self.  Opportunity for questions and clarification provided.       Patient given 3 scripts.         To continue your aftercare when you leave the hospital, you may receive an automated call from our care team to check in on how you are doing.  This is a free service and part of our promise to provide the best care and service to meet your aftercare needs.??? If you have questions, or wish to unsubscribe from this service please call [phone].  Thank you for Choosing our [facility] Emergency Department.        Gerre Couch, RN  10/07/21 1128

## 2021-10-07 NOTE — Discharge Instructions (Signed)
Call the number below to establish care with primary care doctor.  Return if worsening in any way.    We would love to help you get a primary care doctor for follow-up after your emergency department visit.    Please call [phone] between 7AM - 6PM Monday to Friday.  A care navigator will be able to assist you with setting up a doctor close to your home.

## 2021-10-07 NOTE — ED Triage Notes (Signed)
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 Patient is experiencing withdrawal from not taking it because the employer will be testing for it.

## 2021-10-07 NOTE — ED Provider Notes (Signed)
Emergency Department Provider Note                   PCP:                None None               Age: 51 y.o.      Sex: male       ICD-10-CM    1. 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  I have written him for Zofran Bentyl and Klonopin.  After receiving his discharge paperwork and prescriptions he became irate yelling at me stating that he needs at least 5 days worth of Klonopin.  He is demanding to speak to a physician.  Dr. Romualdo Bolk talked to patient.  He will write him for Librium taper. Will refer to [facility].    Risk  Prescription drug management.                                No orders of the defined types were placed in this encounter.       Medications - No data to display    New Prescriptions    CHLORDIAZEPOXIDE (LIBRIUM) 10 MG CAPSULE    Take 1 capsule by mouth 3 times daily as needed for Anxiety for up to 5 days. Max Daily Amount: 30 mg    DICYCLOMINE (BENTYL) 20 MG TABLET    Take 1 tablet by mouth 3 times daily as needed (abd cramping)    ONDANSETRON (ZOFRAN-ODT) 4 MG DISINTEGRATING TABLET    Take 1 tablet by mouth every 8 hours as needed for Nausea or Vomiting        Lee Evans is a 51 y.o. male who presents to the Emergency Department with chief complaint of    Chief Complaint   Patient presents with    Withdrawal     Kratom       Patient is a 51 year old male with history of heroin abuse subsequently went to the [facility] and got on Suboxone.  Has been off of Suboxone for a year and taking kratom.  His employer now tells him that he can no longer take kratom and they will be testing him for it.  He is requesting something to help with withdrawal symptoms.  He  last took his kratom yesterday.        Review of Systems   Constitutional:  Positive for diaphoresis.   HENT: Negative.     Respiratory: Negative.     Cardiovascular: Negative.    Gastrointestinal: Negative.    Genitourinary: Negative.    All other systems reviewed and are negative.    Past Medical History:   Diagnosis Date    Gastrointestinal disorder         No past surgical history on file.     No family history on file.     Social History     Socioeconomic History    Marital status: Divorced   Tobacco Use    Smoking status: Every Day  Packs/day: 0.50     Types: Cigarettes    Smokeless tobacco: Never   Substance and Sexual Activity    Alcohol use: Yes    Drug use: Yes     Types: Methamphetamines (Crystal Meth), Marijuana (Weed)         Penicillins     Previous Medications    ALBUTEROL SULFATE HFA 108 (90 BASE) MCG/ACT INHALER    Inhale 2 puffs into the lungs every 6 hours as needed    POLYETHYLENE GLYCOL (GLYCOLAX) 17 GM/SCOOP POWDER    Take 17 g by mouth daily        Vitals signs and nursing note reviewed.   Patient Vitals for the past 4 hrs:   Temp Pulse BP SpO2   10/07/21 1107 97.7 ??F (36.5 ??C) 96 (!) 140/105 98 %          Physical Exam  Vitals and nursing note reviewed.   Constitutional:       General: He is not in acute distress.     Appearance: Normal appearance. He is normal weight. He is not ill-appearing or toxic-appearing.   HENT:      Head: Normocephalic.   Eyes:      Extraocular Movements: Extraocular movements intact.   Cardiovascular:      Rate and Rhythm: Normal rate and regular rhythm.      Pulses: Normal pulses.      Heart sounds: Normal heart sounds.   Pulmonary:      Effort: Pulmonary effort is normal.      Breath sounds: Normal breath sounds.   Abdominal:      Palpations: Abdomen is soft.      Tenderness: There is no abdominal tenderness.   Musculoskeletal:         General: No swelling or tenderness. Normal range of motion.      Cervical back: Normal range of motion and neck supple.    Skin:     General: Skin is warm and dry.      Findings: No rash.   Neurological:      General: No focal deficit present.      Mental Status: He is alert and oriented to person, place, and time. Mental status is at baseline.   Psychiatric:         Mood and Affect: Mood normal.         Behavior: Behavior normal.         Thought Content: Thought content normal.        Procedures    No results found for any visits on 10/07/21.     No orders to display                       Voice dictation software was used during the making of this note.  This software is not perfect and grammatical and other typographical errors may be present.  This note has not been completely proofread for errors.       Cristela Blue, [place]  10/07/21 1147

## 2021-10-20 LAB — HEMOGLOBIN A1C
Estimated Avg Glucose, External: 105 mg/dL
Hemoglobin A1C, External: 5.3 % (ref <5.7)

## 2021-11-22 ENCOUNTER — Inpatient Hospital Stay
Admit: 2021-11-22 | Discharge: 2021-11-22 | Disposition: A | Discharge status: Home / Self Care | Payer: PRIVATE HEALTH INSURANCE | Attending: Emergency Medicine

## 2021-11-22 DIAGNOSIS — R45851 Suicidal ideations: Secondary | ICD-10-CM

## 2021-11-22 LAB — COMPREHENSIVE METABOLIC PANEL
ALT: 33 U/L (ref 12–65)
AST: 28 U/L (ref 15–37)
Albumin/Globulin Ratio: 1 (ref 0.4–1.6)
Albumin: 3.9 g/dL (ref 3.5–5.0)
Alk Phosphatase: 65 U/L (ref 50–136)
Anion Gap: 1 mmol/L — ABNORMAL LOW (ref 2–11)
BUN: 15 MG/DL (ref 6–23)
CO2: 26 mmol/L (ref 21–32)
Calcium: 9.5 MG/DL (ref 8.3–10.4)
Chloride: 108 mmol/L (ref 101–110)
Creatinine: 1 MG/DL (ref 0.8–1.5)
Est, Glom Filt Rate: >60 mL/min/{1.73_m2} (ref >60)
Globulin: 4 g/dL (ref 2.8–4.5)
Glucose: 108 mg/dL — ABNORMAL HIGH (ref 65–100)
Potassium: 3.5 mmol/L (ref 3.5–5.1)
Sodium: 135 mmol/L (ref 133–143)
Total Bilirubin: 0.4 MG/DL (ref 0.2–1.1)
Total Protein: 7.9 g/dL (ref 6.3–8.2)

## 2021-11-22 LAB — SALICYLATE LEVEL: Salicylate, Serum: 2.3 MG/DL — ABNORMAL LOW (ref 2.8–20.0)

## 2021-11-22 LAB — ACETAMINOPHEN LEVEL: Acetaminophen Level: <2 ug/mL — ABNORMAL LOW (ref 10.0–30.0)

## 2021-11-22 LAB — CBC WITH AUTO DIFFERENTIAL
Absolute Immature Granulocyte: 0.1 10*3/uL (ref 0.0–0.5)
Basophils %: 1 % (ref 0.0–2.0)
Basophils Absolute: 0.1 10*3/uL (ref 0.0–0.2)
Eosinophils %: 3 % (ref 0.5–7.8)
Eosinophils Absolute: 0.3 10*3/uL (ref 0.0–0.8)
Hematocrit: 47.7 % (ref 41.1–50.3)
Hemoglobin: 15.4 g/dL (ref 13.6–17.2)
Immature Granulocytes: 1 % (ref 0.0–5.0)
Lymphocytes %: 18 % (ref 13–44)
Lymphocytes Absolute: 1.8 10*3/uL (ref 0.5–4.6)
MCH: 29.4 PG (ref 26.1–32.9)
MCHC: 32.3 g/dL (ref 31.4–35.0)
MCV: 91 FL (ref 82–102)
MPV: 9.6 FL (ref 9.4–12.3)
Monocytes %: 7 % (ref 4.0–12.0)
Monocytes Absolute: 0.7 10*3/uL (ref 0.1–1.3)
Neutrophils %: 70 % (ref 43–78)
Neutrophils Absolute: 7.3 10*3/uL (ref 1.7–8.2)
Platelets: 270 10*3/uL (ref 150–450)
RBC: 5.24 M/uL (ref 4.23–5.6)
RDW: 14 % (ref 11.9–14.6)
WBC: 10.1 10*3/uL (ref 4.3–11.1)
nRBC: 0 10*3/uL (ref 0.0–0.2)

## 2021-11-22 LAB — ETHANOL: Ethanol Lvl: <3 MG/DL

## 2021-11-22 NOTE — Behavioral Health Treatment Team (Signed)
Behavioral Health Note    Date of Service: 11/22/2021      Chart accessed for review, pending possible request to see pt - but pt discharged by ER provider - no evaluation done by this provider.        Lee Evans R. Lyn Hollingshead PMHNP-BC  11/22/2021  [facility]

## 2021-11-22 NOTE — Discharge Instructions (Signed)
Follow-up with recommended provider in the next 1-2 days.  Return to the ED immediately for any new, worsening, concerning symptoms; or for danger signs as discussed.

## 2021-11-22 NOTE — ED Provider Notes (Signed)
Emergency Department Provider Note                   PCP:                None None               Age: 51 y.o.      Sex: male     [name]    1. Suicidal ideations  R45.851           MEDICAL DECISION MAKING  Complexity of Problems Addressed:  1 or more chronic illnesses with a severe exacerbation or progression.  1 or more acute illnesses that pose a threat to life or bodily function.     Data Reviewed and Analyzed:  Category 1:   I reviewed records from an external source: ED records from outside this hospital.  I reviewed records from an external source: previous lab results from outside ED.  I ordered each unique test.  I interpreted the results of each unique test.        Category 2:   I independently ordered and interpreted the labs    Category 3: Discussion of management or test interpretation.  As in HPI.  Patient was evaluated immediately upon arrival in ED triage.  Denies physical complaint but endorses SI and substance abuse.  Feel legitimate threat for self-harm.  I have spoken to charge nurse and requested that he be placed immediately in appropriate room with sitter, orders placed, discussed with ED attending.  Discussed plan with patient and he is agreeable.  Clinical suspicion of sepsis or other acute emergent medical process outside of psychiatric emergency.  He is agreeable, conversant.  Once placed in exam room, he has been largely sleeping, eating numerous meals and drinking numerous Pepsi.  Has become demanding of the nursing staff to continue to bring him soft drinks.  I discussed the patient with the ED attending as well as our ED worker and our emergency department psych nurse practitioner, they note that patient is a lengthy history of similar complaints.  Chart review reveals that patient was seen at the [facility] emergency department yesterday for similar complaint was evaluated by emergency medicine psychiatrist, felt stable for discharge home with  follow-up.  Medically clear.  On my reevaluation of the patient he is wanting to know if we will get him a place to sleep tonight.  He is denying SI or further complaint.  Patient is stating that as we cannot guarantee him a place to sleep for the night, he would like to leave the ED at this time.  Charge nurse he is leaving at this time.  Patient contracts for safety.  Return precautions were given.  To follow-up with [facility] mental health.       Risk of Complications and/or Morbidity of Patient Management:  Patient was discharged risks and benefits of hospitalization were considered and Discussion with external consultants     Lee Evans is a 51 y.o. male who presents to the Emergency Department with chief complaint of    Chief Complaint   Patient presents with    Suicidal      HPI  51 year old male presents to the ED with complaint of suicidal thoughts with plan, auditory hallucinations, and substance abuse.   Patient states he feels "despair" and desires to kill himself.  Patient states these thoughts and feelings began several weeks ago after he lost his  job and became homeless.  Patient states that he has been living on the street" using and using and using and using."  He endorses abuse of Suboxone and cocaine.  States last Suboxone use was 2 days ago, last cocaine use was last night. He endorses a history of bipolar disorder, does not take any medications for this.  Auditory hallucinations began approximately 2-3 weeks ago and tell him "do it," "just kill yourself."  He denies any recent alcohol or other drug use, denies any illness or other complaint.    Review of Systems  In HPI  Vitals signs and nursing note reviewed.   Patient Vitals for the past 4 hrs:   Temp Pulse Resp BP SpO2   11/22/21 1025 97.9 ??F (36.6 ??C) 90 20 (!) 139/90 99 %          Physical Exam   Constitutional: Oriented to person, place, and time. Appears well-developed and well-nourished. No distress.    HENT:    Head: Normocephalic  and atraumatic   Right Ear: External ear normal.    Left Ear: External ear normal.     Nose: Nose normal.   Mouth/Throat: Mouth normal.    Eyes: Conjunctivae are normal.   Neck: Supple. No tracheal deviation.   Cardiovascular: Normal rate, intact distal pulses. Brisk capillary refill intact, less than 2 seconds. Regular rhythm present. No pitting edema.    Pulmonary/Chest: Lungs are clear & equal bilaterally. No adventitious sounds. No respiratory distress.    Abdominal: Soft. There is no tenderness.    Musculoskeletal: No obvious deformity, erythema, edema.   Neurological: Alert and oriented to person, place, and time. No numbness/tingling. No loss of sensation. Positive PMS ??4. GCS= 15.    Skin: Skin is warm and dry. Capillary refill takes less than 2 seconds. No abrasion, no lesion, no petechiae and no rash noted. Not diaphoretic. No cyanosis, erythema, or pallor.    Psychiatric: Tearful, endorsing SI.  Auditory hallucinations. No Visual hallucinations, denies attempt harm self.  Denies to harm others.  Nursing note and vitals reviewed.         Procedures     No orders of the defined types were placed in this encounter.       Medications - No data to display    New Prescriptions    No medications on file        Past Medical History:   Diagnosis Date    Gastrointestinal disorder         No past surgical history on file.     No family history on file.     Social History     Socioeconomic History    Marital status: Divorced   Tobacco Use    Smoking status: Every Day     Packs/day: 0.50     Types: Cigarettes    Smokeless tobacco: Never   Substance and Sexual Activity    Alcohol use: Yes    Drug use: Yes     Types: Methamphetamines (Crystal Meth), Marijuana (Weed)        Allergies: Penicillins    Previous Medications    ALBUTEROL SULFATE HFA 108 (90 BASE) MCG/ACT INHALER    Inhale 2 puffs into the lungs every 6 hours as needed    DICYCLOMINE (BENTYL) 20 MG TABLET    Take 1 tablet by mouth 3 times daily as needed (abd  cramping)    ONDANSETRON (ZOFRAN-ODT) 4 MG DISINTEGRATING TABLET    Take 1 tablet by mouth  every 8 hours as needed for Nausea or Vomiting    POLYETHYLENE GLYCOL (GLYCOLAX) 17 GM/SCOOP POWDER    Take 17 g by mouth daily        Results for orders placed or performed during the hospital encounter of 11/22/21   CBC with Auto Differential   Result Value Ref Range    WBC 10.1 4.3 - 11.1 K/uL    RBC 5.24 4.23 - 5.6 M/uL    Hemoglobin 15.4 13.6 - 17.2 g/dL    Hematocrit 47.7 41.1 - 50.3 %    MCV 91.0 82 - 102 FL    MCH 29.4 26.1 - 32.9 PG    MCHC 32.3 31.4 - 35.0 g/dL    RDW 14.0 11.9 - 14.6 %    Platelets 270 150 - 450 K/uL    MPV 9.6 9.4 - 12.3 FL    nRBC 0.00 0.0 - 0.2 K/uL    Differential Type AUTOMATED      Seg Neutrophils 70 43 - 78 %    Lymphocytes 18 13 - 44 %    Monocytes 7 4.0 - 12.0 %    Eosinophils % 3 0.5 - 7.8 %    Basophils 1 0.0 - 2.0 %    Immature Granulocytes 1 0.0 - 5.0 %    Segs Absolute 7.3 1.7 - 8.2 K/UL    Absolute Lymph # 1.8 0.5 - 4.6 K/UL    Absolute Mono # 0.7 0.1 - 1.3 K/UL    Absolute Eos # 0.3 0.0 - 0.8 K/UL    Basophils Absolute 0.1 0.0 - 0.2 K/UL    Absolute Immature Granulocyte 0.1 0.0 - 0.5 K/UL   Comprehensive Metabolic Panel   Result Value Ref Range    Sodium 135 133 - 143 mmol/L    Potassium 3.5 3.5 - 5.1 mmol/L    Chloride 108 101 - 110 mmol/L    CO2 26 21 - 32 mmol/L    Anion Gap 1 (L) 2 - 11 mmol/L    Glucose 108 (H) 65 - 100 mg/dL    BUN 15 6 - 23 MG/DL    Creatinine 1.00 0.8 - 1.5 MG/DL    Est, Glom Filt Rate >60 >60 ml/min/1.46m    Calcium 9.5 8.3 - 10.4 MG/DL    Total Bilirubin 0.4 0.2 - 1.1 MG/DL    ALT 33 12 - 65 U/L    AST 28 15 - 37 U/L    Alk Phosphatase 65 50 - 136 U/L    Total Protein 7.9 6.3 - 8.2 g/dL    Albumin 3.9 3.5 - 5.0 g/dL    Globulin 4.0 2.8 - 4.5 g/dL    Albumin/Globulin Ratio 1.0 0.4 - 1.6     Salicylate   Result Value Ref Range    Salicylate, Serum 2.3 (L) 2.8 - 20.0 MG/DL   Acetaminophen Level   Result Value Ref Range    Acetaminophen Level <2 (L) 10.0 -  30.0 ug/mL   Ethanol   Result Value Ref Range    Ethanol Lvl <3 MG/DL        No orders to display                     Voice dictation software was used during the making of this note.  This software is not perfect and grammatical and other typographical errors may be present.  This note has not been completely proofread for errors.     PCaprice KluverBenetatos,  APRN - CNP  11/22/21 1529

## 2021-11-22 NOTE — ED Triage Notes (Signed)
Patient ambulatory to triage with [place] SI. Reports "I came this close last night" reports attempts in the past and auditory hallucinations. Reports plan to OD and access to drugs at home

## 2021-11-23 LAB — EKG 12-LEAD
Atrial Rate: 86 {beats}/min
P Axis: 29 degrees
P-R Interval: 164 ms
Q-T Interval: 369 ms
QRS Duration: 89 ms
QTc Calculation (Bazett): 442 ms
R Axis: -58 degrees
T Axis: 60 degrees
Ventricular Rate: 86 {beats}/min

## 2024-04-14 NOTE — ED Provider Notes (Signed)
 ------------------------------------------------------------------------------- Attestation signed by Katheryn Rollo Jewels, DO at 04/16/24 1230 Patient was seen by PA or NP and I was not involved in patient's work-up, care plan, treatment or disposition.  I was available for consultation at all points during patient's visit.   -------------------------------------------------------------------------------   [facility]  ED Provider Note  Chancy Smigiel 53 y.o. male DOB: 15-Nov-1970 MRN: [id number] History   Chief Complaint  Patient presents with  . Chest Pain    Pt arrives via ems c/o CP. The medic that brought the pt states he took the same pt to [facility] yesterday and was seen for same. Pt refuses treatment. States he is anxious and looks up information on the internet and has dx himself with malignant hypertension. Pt reports he is taking his zyprexa around the clock to keep his HR down. Pt refused all treatment with ems enroute.    HPI Patient presents emergency department for evaluation of of chest pain.  He came via EMS.  He was taken yesterday to [facility] where he refused treatment.  He states he took 900 mg of gabapentin  today as well as Zyprexa.  Has a history of cocaine and marijuana abuse.  History of bipolar disorder.  History of depression.  History of combativeness with healthcare providers.  He was previously recommended for PTCA by cardiology at [facility] however due to his combativeness they decided not to do the procedure.  He states he feels as if he has a ghost in his chest today.  He also continues to complain of pressure and pain.  He states that he is worried about having malignant hypertension.  His blood pressure is currently normal.    Past Medical History:  Diagnosis Date  . Bipolar disorder  (*)    Patient reports he was diagnose with bipolar but does not think that is correct  . Depression, major     No past surgical  history on file.  Social History   Substance and Sexual Activity  Alcohol Use Not Currently   Tobacco Use History[1] E-Cigarettes  . Vaping Use    . Start Date    . Cartridges/Day    . Quit Date     Social History   Substance and Sexual Activity  Drug Use Yes  . Frequency: 7.0 times per week  . Types: Cocaine, Marijuana   Comment: last time used (yesterday)          Allergies[2]  Home Medications   ALBUTEROL  SULFATE HFA (PROVENTIL ,VENTOLIN ,PROAIR ) 108 (90 BASE) MCG/ACT INHALER    Inhale one puff to two puffs into the lungs every 6 (six) hours as needed for Wheezing or Shortness of Breath.   CLONAZEPAM  (KLONOPIN ) 1 MG TABLET    Take by mouth.   CLONIDINE (CATAPRES) 0.2 MG TABLET    Take one tablet (0.2 mg dose) by mouth 2 (two) times daily for 14 days.   GABAPENTIN  (NEURONTIN ) 100 MG CAPSULE    Take one capsule (100 mg dose) by mouth 3 (three) times a day for 14 days.   KETOROLAC  (TORADOL ) 10 MG TABLET    Take one tablet (10 mg dose) by mouth every 6 (six) hours as needed for Pain for up to 3 days.   LIDOCAINE (HM LIDOCAINE PATCH) 4 %    Place one patch onto the skin every 24 (twenty-four) hours as needed (for mild/moderate pain).   PROMETHAZINE  (PHENERGAN ) 25 MG TABLET    Take one tablet (25 mg dose) by mouth  every 8 (eight) hours as needed for Nausea.   SERTRALINE HCL (ZOLOFT PO)    Take by mouth.   TIZANIDINE (ZANAFLEX) 4 MG TABLET    Take one tablet (4 mg dose) by mouth every 8 (eight) hours as needed.    Primary Survey  Primary Survey  Review of Systems   Review of Systems See history of present illness otherwise 5 point review of systems is negative.  Physical Exam   ED Triage Vitals [04/14/24 1428]  BP 112/76  Heart Rate 93  Resp 20  SpO2 99 %  Temp 98.4 F (36.9 C)    Physical Exam  Nursing notes and vital signs are reviewed General: Well-developed, well-nourished, no distress HEENT: Normocephalic, atraumatic.  External ears are normal.  External  nose is normal.  Oropharynx seems clear.  Voice is normal.  Pupils equal round reactive light and accommodation.  Extraocular movements are intact. Neck: Voice is normal.  No meningeal signs. Chest: Respirations are even and unlabored.  Clear to auscultation bilaterally. Cardiovascular: Regular rate and rhythm.  No murmur Abdomen: No obvious distention.  Soft, nontender.  No guarding rebound.  No masses. Musculoskeletal: Moves all extremities well.  No obvious injuries Neurological: Alert and oriented x3.  Moves all extremities well.  No gross abnormalities. Finger to nose normal, Heel to shin normal, Muscle Strength 5/5 upper and lower extremities. Sensation intact upper and lower extremities.  Skin: Warm and dry Psychiatric: Normal mood and affect.  Behavior is grossly normal.  Judgment and thought content are normal.   ED Course   Lab results:   CBC AND DIFFERENTIAL - Abnormal      Result Value   WBC 7.9     RBC 4.17 (*)    HGB 12.6 (*)    HCT 37.1 (*)    MCV 89.0     MCH 30.2     MCHC 34.0     Plt Ct 221     RDW SD 43.2     MPV 9.3 (*)    NRBC% 0.0     Absolute NRBC Count 0.00     NEUTROPHIL % 71.8     LYMPHOCYTE % 15.3     MONOCYTE % 7.6     Eosinophil % 4.1     BASOPHIL % 0.8     IG% 0.4     ABSOLUTE NEUTROPHIL COUNT 5.67     ABSOLUTE LYMPHOCYTE COUNT 1.21     Absolute Monocyte Count 0.60     Absolute Eosinophil Count 0.32     Absolute Basophil Count 0.06     Absolute Immature Granulocyte Count 0.03    COMPREHENSIVE METABOLIC PANEL - Abnormal   Na 137     Potassium 3.9     Cl 101     CO2 25     AGAP 11     Glucose 106 (*)    BUN 15     Creatinine 1.01     Ca 9.0     ALK PHOS 61     T Bili 0.3     Total Protein 7.1     Alb 4.1     GLOBULIN 3.0     ALBUMIN/GLOBULIN RATIO 1.4     BUN/CREAT RATIO 14.9     ALT 399 (*)    AST 73 (*)    eGFR 88     Comment: Normal GFR (glomerular filtration rate) > 60 mL/min/1.73 meters squared, < 60 may include impaired  kidney function. Calculation based  on the Chronic Kidney Disease Epidemiology Collaboration (CK-EPI)equation refit without adjustment for race.  GEN5 CARDIAC TROPONIN T (TNT5) BASELINE - Normal   TnT-Gen5 (0hr) 5     Comment: Assay results < 6 ng/L and > 10,000 ng/L are reported as 5 ng/L and 10,001 ng/L respectively to reflect the absolute reportable range of TnTGen5.  An elevated Troponin indicates myocardial damage. Elevated troponin may also be due to pulmonary emboli, aortic dissection, heart failure, trauma, toxins and ischemia in the setting of critical illness.   MAGNESIUM  - Normal   Mg 1.7    URINALYSIS ONLY - NO SYMPTOMS - Normal   Urine Color Yellow     Urine Clarity Clear     Urine Specific Gravity 1.015     Urine pH 7.0     Urine Protein - Dipstick Negative     Urine Glucose Negative     Urine Ketones Negative     Urine Bilirubin Negative     Urine Blood Negative     Urine Nitrite Negative     Urine Urobilinogen <2     Urine Leukocyte Esterase Negative     UA Microscopic No Micro    URINE DRUGS OF ABUSE SCRN - Normal   Ur PH DOA Scr 7.0     Amphet Scr Negative     Barb Scr Negative     Benzo Scr Negative     Cannab Scr Negative     Cocaine Scr Negative     Opiates Scr Negative     Meth Scr Negative     Oxyco Scr Negative     Fentanyl  Scr Negative     Buprenorphine Screen Negative     Narrative:    Please Note Detection Levels Below:                            Amphetamines                    1000 ng/mL  Barbiturates                    200 ng/mL  Benzodiazepines                 200 ng/mL  Cannabinoids (Marijuana, THC)   50 ng/mL  Cocaine                         300 ng/mL  Opiates                         300 ng/mL  Methadone                       300 ng/mL  Oxycodone                        100 ng/mL  Fentanyl                           5 ng/mL  Buprenorphine                     5 ng/mL  This test is a screening test and results are only to be used for  medical purposes.  If confirmation of positive results are needed, please order confirmation by GC/MS for each drug that needs confirmation.  Urine specimens are retained for 5 days.   GEN5 CARDIAC TROPONIN T(TNT5) 1 HOUR - Normal   TnT-Gen5 (1hr) 5     Comment: Assay results < 6 ng/L and > 10,000 ng/L are reported as 5 ng/L and 10,001 ng/L respectively to reflect the absolute reportable range of TnTGen5.  An elevated Troponin indicates myocardial damage. Elevated troponin may also be due to pulmonary emboli, aortic dissection, heart failure, trauma, toxins and ischemia in the setting of critical illness.   Delta 1 Hour 0    GEN5 CARDIAC TROPONIN T(TNT5) 3 HOUR  LIGHT BLUE TOP  GOLD SST    Imaging:   XR CHEST AP PORTABLE   Narrative:    EXAM: XR CHEST AP PORTABLE DATE: 04/14/2024 2:53 PM ACCESSION: [id number] DICTATED: 04/14/2024 3:28 PM  CLINICAL INDICATION: 53 years old Male with Chest Pain    COMPARISON: 11/13/2022  TECHNIQUE: Portable Chest Radiograph.  FINDINGS:   Minimal left basilar atelectasis otherwise lungs are clear  No pleural effusion or pneumothorax  Unremarkable cardiomediastinal silhouette.      Impression:    IMPRESSION:  No focal airspace disease.   Electronically Signed by: Cheryle Cork, MD on 04/14/2024 3:29 PM  CT ABDOMEN PELVIS WO IV CONTRAST (STONE PROTOCOL)      ECG: ECG Results          ECG 12 lead (In process)  Result time 04/14/24 15:42:50    In process             Narrative:   Diagnosis Class Normal Acquisition Device D3K Systolic BP 138 Diastolic BP 88 Ventricular Rate 81 Atrial Rate 81 P-R Interval 164 QRS Duration 88 Q-T Interval 368 QTC Calculation(Bazett) 427 Calculated P Axis 50 Calculated R Axis -25 Calculated T Axis 25  Diagnosis Normal sinus rhythm Normal ECG When compared with ECG of 29-Jan-2023 11:57, T wave amplitude has increased in Lateral leads               In process              Narrative:   Diagnosis Class Normal Acquisition Device D3K Systolic BP 138 Diastolic BP 88 Ventricular Rate 81 Atrial Rate 81 P-R Interval 164 QRS Duration 88 Q-T Interval 368 QTC Calculation(Bazett) 427 Calculated P Axis 50 Calculated R Axis -25 Calculated T Axis 25  Diagnosis Normal sinus rhythm Normal ECG When compared with ECG of 29-Jan-2023 11:57, T wave amplitude has increased in Lateral leads                            HEAR Score History: Mostly low risk features ECG: Normal Age: 18-64 yrs Risk Factors: More than or equal 3 risk factors or history of atherosclerotic disease HEAR Score Total: 3                                                        Pre-Sedation Procedures  ED Course as of 04/14/24 1650  Elsie BIRCH Thompson's Documentation  Sun Apr 14, 2024  1614 AST (SGOT)(!): 73  1614 ALT(!): 399  1614 Glucose(!): 106  1614 Amphetamine, Urine: Negative  1614 Barbiturate Screen, Ur: Negative  1614 Benzodiazepines Screen, Urine: Negative  1614 Cannabinoid Screen, Urine: Negative  1614 Cocaine Screen, Urine: Negative  1614 Opiate Screen, Urine: Negative  1614 Methadone Screen, Urine: Negative  1614 Oxycodone  Screen, Ur: Negative  1614 Fentanyl  Scr: Negative  1614 Buprenorphine Screen: Negative  1614 Color: Yellow  1614 Urine Clarity: Clear  1614 pH, Urine: 7.0  1614 WBC: 7.9  1614 Hemoglobin(!): 12.6  1614 Hematocrit(!): 37.1  1614 Platelet Count: 221  1614 TnT-Gen5 (0hr): 5  1614 TnT-Gen5 (1hr): 5   Medical Decision Making Patient presents the emergency department complaining that he feels like he is having palpitations and feeling like his heart is racing.  He has a history of polysubstance abuse, coronary artery disease.  He tells me he feels like he has a ghost in his chest.  Denies any abdominal pain.  Does have history of alcohol abuse.  Cardiac focused labs were ordered.  Chest x-ray was negative.   Troponins were 5 and 5.  Rules out from a cardiac perspective.  Urine drug screen was negative.  Urinalysis negative for infection.  No leukocytosis.  No major electrolyte abnormality no major anemia.  Chest x-ray was negative.  AST and ALT were elevated at 73 and 399.  CT scan abdomen pelvis ordered to further evaluate his abdomen.  Patient was demanding Ativan  during his visit.  Drug screen is negative.  Continues to be argumentative with staff.  CT scan ordered.  Patient uncooperative with IV start.  Kicking and yelling at staff.  Will check a Non contrast CT scan.  Will turn over care to the oncoming APC for final disposition.  Plan discharge with follow-up to gastroenterology.  Amount and/or Complexity of Data Reviewed Labs: ordered. Decision-making details documented in ED Course. Radiology: ordered.            Provider Communication  New Prescriptions   No medications on file    Modified Medications   No medications on file    Discontinued Medications   No medications on file    Clinical Impression Final diagnoses:  Chest pain, unspecified type  Elevated liver enzymes    ED Disposition     None               Follow-up Information     [facility]. Call in 1 day.   Contact information: [address], [address] [place]   [address] [phone]                 Electronically signed by:     Elsie JONETTA Hummer, PA-C 04/14/24 1614       [1] Social History Tobacco Use  Smoking Status Every Day  Smokeless Tobacco Not on file  [2] Allergies Allergen Reactions  . Penicillins Rash   Elsie JONETTA Hummer, PA-C 04/14/24 1650

## 2024-04-14 NOTE — ED Provider Notes (Signed)
 [facility]  ED Progress Note   Patient reassured and I reviewed in detail the results of his ECG, serial troponin and noncontrast CT abdomen.    Patient discharged home for follow-up with digestive health services and also with his PCM who can help him with his maintenance medication.  I agreed to provide a short course of Klonopin  that the patient has in his history that he states he is out of.  The patient stated he will pick this up at the [facility] pharmacy on [address] in [place].  Strict return precautions were discussed with the patient who understands that he should return to the emergency room for worsening symptoms or other urgent or emergent medical concerns. Electronically Signed by:   Ozell Sherwood Banks, PA-C 04/24/24 1504

## 2024-07-22 ENCOUNTER — Emergency Department (HOSPITAL_COMMUNITY): Payer: MEDICAID

## 2024-07-22 ENCOUNTER — Emergency Department (HOSPITAL_COMMUNITY)
Admission: EM | Admit: 2024-07-22 | Discharge: 2024-07-22 | Disposition: A | Discharge status: Home / Self Care | Payer: MEDICAID | Attending: Emergency Medicine | Admitting: Emergency Medicine

## 2024-07-22 ENCOUNTER — Other Ambulatory Visit: Payer: Self-pay

## 2024-07-22 ENCOUNTER — Encounter (HOSPITAL_COMMUNITY): Payer: Self-pay

## 2024-07-22 DIAGNOSIS — R079 Chest pain, unspecified: Secondary | ICD-10-CM | POA: Diagnosis present

## 2024-07-22 DIAGNOSIS — Z5329 Procedure and treatment not carried out because of patient's decision for other reasons: Secondary | ICD-10-CM | POA: Insufficient documentation

## 2024-07-22 DIAGNOSIS — F17210 Nicotine dependence, cigarettes, uncomplicated: Secondary | ICD-10-CM | POA: Insufficient documentation

## 2024-07-22 MED ORDER — ACETAMINOPHEN 500 MG PO TABS
1000.0000 mg | ORAL_TABLET | Freq: Once | ORAL | Status: DC
  Filled 2024-07-22: qty 2

## 2024-07-22 MED ORDER — NITROGLYCERIN 0.4 MG SL SUBL: 0.4000 mg | SUBLINGUAL_TABLET | SUBLINGUAL | Status: DC | PRN

## 2024-07-22 MED ORDER — ASPIRIN 81 MG PO CHEW: 324.0000 mg | CHEWABLE_TABLET | Freq: Once | ORAL | Status: DC

## 2024-07-22 MED ORDER — SODIUM CHLORIDE 0.9 % IV BOLUS: 1000.0000 mL | Freq: Once | INTRAVENOUS | Status: DC

## 2024-07-22 NOTE — ED Notes (Signed)
 Pt states this is a waste of time, you guys are going to run the same tests that every other hospital does and they will all be negative. Troponin is always negative, chest x ray, EKG. You guys need to make sure my heart is fillling all the way up with blood like they did last time with an ultrasound. Pt wants to speak to the doctor at this time.

## 2024-07-22 NOTE — ED Provider Notes (Signed)
 [place] EMERGENCY DEPARTMENT AT [facility] Provider Note  CSN: [id number] Arrival date & time: 07/22/24 9291  Chief Complaint(s) Chest Pain  HPI Ian Ruiz is a 53 y.o. male with past medical history as below, significant for Mnire disease, polysubstance abuse, cocaine induced mood disorder, bipolar disorder, ulcerative colitis who presents to the ED with complaint of chest pain  Patient reports he had left heart cath done around 3 months ago at [facility], reports since then he has been having intermittent chest pain, particularly noticeable in the morning when he wakes up.  Feels like an aching, throbbing sensation midsternal that does not radiate.  Seems to improve when he drinks a glass of water and takes a baby aspirin .  He feels that his heart is not feeling with enough blood at nighttime is causing circulation issues.  He has no pain with exertion or with significant position changes.  No difficulty breathing, no nausea or vomiting, no diaphoresis, no syncope.  Reports he stopped taking all his medications because he was having side effects.  He feels over the last 3 days his morning chest pain has become more constant and he has been experiencing every morning, prior to that he was only having the pain intermittently every couple days.  He has not yet followed up with cardiology following his procedure.  I am unable to view cath report or cardiology documentation from OSH as it is not available, there was discussion of [facility] at [facility] but was aborted due to pt noncompliance /combativeness per documentation from recent [facility] ED visit   Past Medical History Past Medical History:  Diagnosis Date   Meniere disease    Substance abuse (HCC)    Ulcerative colitis    Patient Active Problem List   Diagnosis Date Noted   Polysubstance dependence including opioid type drug without complication, episodic abuse (HCC) 02/21/2016   Substance induced mood disorder (HCC) 02/20/2016    Cocaine abuse (HCC) 02/11/2016   Cocaine-induced mood disorder (HCC) 02/11/2016   Acute pancreatitis 08/12/2012   Lower urinary tract infectious disease 09/20/2011   ARF (acute renal failure) 09/19/2011   Abdominal pain, acute, bilateral lower quadrant 09/19/2011   Leukocytosis 09/19/2011   Suicidal ideation 09/19/2011   Bipolar disorder (manic depression) (HCC) 09/19/2011   Ascites 09/19/2011   Ulcerative colitis (HCC) 09/19/2011   Meniere disease 09/19/2011   Polysubstance abuse (HCC) 09/19/2011   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 Authorizing Provider  citalopram  (CELEXA ) 10 MG tablet Take 1 tablet (10 mg total) by mouth daily. 02/21/16   Jacquetta Sharlot GRADE, NP  gabapentin  (NEURONTIN ) 100 MG capsule Take 2 capsules (200 mg total) by mouth 2 (two) times daily. 02/21/16   Jacquetta Sharlot GRADE, NP  hydrOXYzine  (ATARAX /VISTARIL ) 50 MG tablet Take 1 tablet (50 mg total) by mouth every 6 (six) hours as needed for anxiety. 02/21/16   Jacquetta Sharlot GRADE, NP  Past Surgical History Past Surgical History:  Procedure Laterality Date   FRACTURE SURGERY     Family History Family History  Problem Relation Age of Onset   COPD Father    Heart attack Father     Social History Social History   Tobacco Use   Smoking status: Every Day    Current packs/day: 0.00    Average packs/day: 0.5 packs/day for 20.0 years (10.0 ttl pk-yrs)    Types: Cigarettes    Start date: 08/14/1981    Last attempt to quit: 08/14/2001    Years since quitting: 22.9   Smokeless tobacco: Never  Substance Use Topics   Alcohol use: No    Comment: 1 month substance free   Drug use: No    Types: Marijuana, Cocaine    Comment: last used yesterday   Allergies Clonidine derivatives and Penicillins  Review  of Systems A thorough review of systems was obtained and all systems are negative except as noted in the HPI and PMH.   Physical Exam Vital Signs  I have reviewed the triage vital signs BP 112/73 (BP Location: Right Arm)   Pulse 75   Resp 13   Ht 5' 10 (1.778 m)   Wt 83.9 kg   SpO2 99%   BMI 26.54 kg/m  Physical Exam Vitals and nursing note reviewed.  Constitutional:      General: He is not in acute distress.    Appearance: He is well-developed.  HENT:     Head: Normocephalic and atraumatic.     Right Ear: External ear normal.     Left Ear: External ear normal.     Mouth/Throat:     Mouth: Mucous membranes are moist.  Eyes:     General: No scleral icterus. Cardiovascular:     Rate and Rhythm: Normal rate and regular rhythm.     Pulses: Normal pulses.     Heart sounds: Normal heart sounds.     No S3 or S4 sounds.  Pulmonary:     Effort: Pulmonary effort is normal. No respiratory distress.     Breath sounds: Normal breath sounds.  Abdominal:     General: Abdomen is flat.     Palpations: Abdomen is soft.     Tenderness: There is no abdominal tenderness.  Musculoskeletal:     Cervical back: No rigidity.     Right lower leg: No edema.     Left lower leg: No edema.  Skin:    General: Skin is warm and dry.     Capillary Refill: Capillary refill takes less than 2 seconds.  Neurological:     Mental Status: He is alert.  Psychiatric:        Mood and Affect: Mood normal.        Behavior: Behavior normal.     ED Results and Treatments Labs (all labs ordered are listed, but only abnormal results are displayed) Labs Reviewed  BASIC METABOLIC PANEL WITH GFR  CBC WITH DIFFERENTIAL/PLATELET  BRAIN NATRIURETIC PEPTIDE  RAPID URINE DRUG SCREEN, HOSP PERFORMED  TROPONIN I (HIGH SENSITIVITY)  TROPONIN I (HIGH SENSITIVITY)  Radiology DG Chest 2  View Result Date: 07/22/2024 CLINICAL DATA:  Chest pain EXAM: CHEST - 2 VIEW COMPARISON:  04/13/2024 FINDINGS: The lungs are clear without focal pneumonia, edema, pneumothorax or pleural effusion. The cardiopericardial silhouette is within normal limits for size. No acute bony abnormality. Telemetry leads overlie the chest. IMPRESSION: No active cardiopulmonary disease. Electronically Signed   By: Camellia Candle M.D.   On: 07/22/2024 07:54    Pertinent labs & imaging results that were available during my care of the patient were reviewed by me and considered in my medical decision making (see MDM for details).  Medications Ordered in ED Medications  nitroGLYCERIN  (NITROSTAT ) SL tablet 0.4 mg (has no administration in time range)  sodium chloride  0.9 % bolus 1,000 mL (1,000 mLs Intravenous Patient Refused/Not Given 07/22/24 0823)  acetaminophen  (TYLENOL ) tablet 1,000 mg (1,000 mg Oral Patient Refused/Not Given 07/22/24 9176)                                                                                                                                     Procedures Procedures  (including critical care time)  Medical Decision Making / ED Course    Medical Decision Making:    Laine Giovanetti is a 53 y.o. male with past medical history as below, significant for Mnire disease, polysubstance abuse, cocaine induced mood disorder, bipolar disorder, ulcerative colitis who presents to the ED with complaint of chest pain. The complaint involves an extensive differential diagnosis and also carries with it a high risk of complications and morbidity.  Serious etiology was considered. Ddx includes but is not limited to: Differential includes all life-threatening causes for chest pain. This includes but is not exclusive to acute coronary syndrome, aortic dissection, pulmonary embolism, cardiac tamponade, community-acquired pneumonia, pericarditis, musculoskeletal chest wall pain, etc.   Complete initial  physical exam performed, notably the patient was in no acute distress.    Reviewed and confirmed nursing documentation for past medical history, family history, social history.  Vital signs reviewed.    Chest pain> - HDS, cardiopulm exam is stable - EKG nonischemic - he has no fever, there is no friction rub, pain not improved w/ leaning forward, no murmur, no evidence of volume overload - CXR stable - pt refused ntg, refused fluids, refused labs  Clinical Course as of 07/22/24 0853  [facility] Jul 22, 2024  0820 Patient refusing labs or IV start until he receives pain medication.  He was offered nitroglycerin  and APAP which he refused. Pt combative, argumentative, requesting opiate  [SG]  0848 Pt refusing to have any lab work performed, demanding pain medication before any testing can be performed, pt combative and argumentative. Reports we are wasting his time checking labs and monitoring him.  [SG]    Clinical Course User Index [SG] Elnor Jayson LABOR, DO    This was a difficult patient encounter.  The patient's expectations as to management were not  able to be met given the guidelines from the state, hospital guidelines, and the patient's personal care guidelines.  I discussed at length with the patient my concerns and offered treatment options that were in keeping with our policies and procedures.  Unfortunately this did not alleviate the patient's concerns.  At all times, myself and the staff were respectful of the patient, attempted to solve their issues within our constraints, and treated the patient with all due courtesy.  Unfortunately the patient left disappointed as to their expectations.  The patient received medically appropriate treatment for the objective findings. I encouraged pt to follow up with his pcp and cardiologist in the office  The patient has requested to leave the ED against medical advice. I believe this patient is of sound mind and medical decision making capacity to refuse  medical care. The patient is responding and asking questions appropriately. The patient is oriented to person, place and time. The patient is not psychotic, delusional, suicidal, homicidal or hallucinating. The patient demonstrates a normal mental capacity to make decisions regarding their healthcare. The patient is clinically sober and does not appear to be under the influence of any illicit drugs at this time. The patient has been advised of the risks, in layman terms, of leaving AMA which include, but are not limited to death, coma, permanent disability, loss of current lifestyle, delay in diagnosis. Alternatives have been offered - the patient remains steadfast in their wish to leave. The patient has been advised that should they change their mind they are welcome to return to this hospital, or any other, at any time. The patient understands that in no way does an AMA discharge mean that I do not want them to have the best medical care available. To this end, I have offered appropriate prescriptions, referrals, and discharge instructions. The patient did sign AMA paperwork. The above discussion was witnessed by another member of staff.                Additional history obtained: -Additional history obtained from na -External records from outside source obtained and reviewed including: Chart review including previous notes, labs, imaging, consultation notes including  Prior er documentation, prior labs   Lab Tests: -I ordered labs > pt refused  Labs Reviewed  BASIC METABOLIC PANEL WITH GFR  CBC WITH DIFFERENTIAL/PLATELET  BRAIN NATRIURETIC PEPTIDE  RAPID URINE DRUG SCREEN, HOSP PERFORMED  TROPONIN I (HIGH SENSITIVITY)  TROPONIN I (HIGH SENSITIVITY)      EKG   EKG Interpretation Date/Time:  Monday July 22 2024 07:14:04 EST Ventricular Rate:  74 PR Interval:  171 QRS Duration:  97 QT Interval:  388 QTC Calculation: 431 R Axis:   -37  Text Interpretation: Sinus  arrhythmia Left axis deviation no stemi similar to prior Confirmed by Elnor Savant ([id number]) on 07/22/2024 7:29:15 AM         Imaging Studies ordered: I ordered imaging studies including CXR I independently visualized the following imaging with scope of interpretation limited to determining acute life threatening conditions related to emergency care; findings noted above I agree with the radiologist interpretation If any imaging was obtained with contrast I closely monitored patient for any possible adverse reaction a/w contrast administration in the emergency department   Medicines ordered and prescription drug management: Meds ordered this encounter  Medications   nitroGLYCERIN  (NITROSTAT ) SL tablet 0.4 mg   DISCONTD: aspirin  chewable tablet 324 mg   sodium chloride  0.9 % bolus 1,000 mL   acetaminophen  (TYLENOL ) tablet  1,000 mg    -I have reviewed the patients home medicines and have made adjustments as needed   Consultations Obtained: na   Cardiac Monitoring: Pt refused telemetry  Continuous pulse oximetry interpreted by myself, 99% on RA.    Social Determinants of Health:  Diagnosis or treatment significantly limited by social determinants of health: current smoker and polysubstance abuse   Reevaluation: After the interventions noted above, I reevaluated the patient and found that they have stayed the same  Co morbidities that complicate the patient evaluation  Past Medical History:  Diagnosis Date   Meniere disease    Substance abuse (HCC)    Ulcerative colitis       Dispostion: Disposition decision including need for hospitalization was considered, and patient Left against medical advice    Final Clinical Impression(s) / ED Diagnoses Final diagnoses:  Chest pain, unspecified type        Elnor Jayson LABOR, DO 07/22/24 [phone]

## 2024-07-22 NOTE — ED Notes (Signed)
 Pt refused IV, labs, IV fluids until he gets pain medicine Pt offered Tylenol  and Nitroglycerin  but refused both stating that won't work for me I need something to help this pain Educated patient on the use of nitroglycerin  and Tylenol  for moderate chest pain management. Pt yelling at staff, standing up and being aggressive.

## 2024-07-22 NOTE — ED Triage Notes (Addendum)
 Pt BIB EMS from home after waking up with chest pain, has been intermittent for past 3 months since stent placement. Pt states it seems to be worse when he is laying down and chest pain happens most mornings after waking up. Prescribed nitroglycerin  but doesn't have anymore so he didn't take any this morning. EMS gave aspirin  but pt refused nitroglycerin  and IV and wouldn't answer any more EMS questions.

## 2024-07-22 NOTE — Discharge Instructions (Signed)
 Return to the Emergency Department if you have unusual chest pain, pressure, or discomfort, shortness of breath, nausea, vomiting, burping, heartburn, tingling upper body parts, sweating, cold, clammy skin, or racing heartbeat. Call 911 if you think you are having a heart attack. Take all cardiac medications as prescribed - notify your doctor if you have any side effects. Follow cardiac diet - avoid fatty & fried foods, don't eat too much red meat, eat lots of fruits & vegetables, and dairy products should be low fat. Please lose weight if you are overweight. Become more active with walking, gardening, or any other activity that gets you to moving.   Please return to the emergency department immediately for any new or concerning symptoms, or if you get worse or if you would like to complete your workup that was started in the ED

## 2024-07-26 ENCOUNTER — Emergency Department (HOSPITAL_COMMUNITY)
Admission: EM | Admit: 2024-07-26 | Discharge: 2024-07-26 | Disposition: A | Discharge status: Home / Self Care | Payer: MEDICAID | Attending: Emergency Medicine | Admitting: Emergency Medicine

## 2024-07-26 ENCOUNTER — Other Ambulatory Visit: Payer: Self-pay

## 2024-07-26 ENCOUNTER — Emergency Department (HOSPITAL_COMMUNITY): Payer: MEDICAID

## 2024-07-26 ENCOUNTER — Encounter (HOSPITAL_COMMUNITY): Payer: Self-pay | Admitting: Emergency Medicine

## 2024-07-26 DIAGNOSIS — E871 Hypo-osmolality and hyponatremia: Secondary | ICD-10-CM | POA: Diagnosis not present

## 2024-07-26 DIAGNOSIS — R0789 Other chest pain: Secondary | ICD-10-CM | POA: Diagnosis present

## 2024-07-26 LAB — CBC
HCT: 39.2 % (ref 39.0–52.0)
Hemoglobin: 13.1 g/dL (ref 13.0–17.0)
MCH: 29.5 pg (ref 26.0–34.0)
MCHC: 33.4 g/dL (ref 30.0–36.0)
MCV: 88.3 fL (ref 80.0–100.0)
Platelets: 311 K/uL (ref 150–400)
RBC: 4.44 MIL/uL (ref 4.22–5.81)
RDW: 13 % (ref 11.5–15.5)
WBC: 8.7 K/uL (ref 4.0–10.5)
nRBC: 0 % (ref 0.0–0.2)

## 2024-07-26 LAB — TROPONIN I (HIGH SENSITIVITY)
Troponin I (High Sensitivity): 3 ng/L (ref <18)
Troponin I (High Sensitivity): <2 ng/L (ref <18)

## 2024-07-26 LAB — BASIC METABOLIC PANEL WITH GFR
Anion gap: 12 (ref 5–15)
BUN: 14 mg/dL (ref 6–20)
CO2: 23 mmol/L (ref 22–32)
Calcium: 8.7 mg/dL — ABNORMAL LOW (ref 8.9–10.3)
Chloride: 97 mmol/L — ABNORMAL LOW (ref 98–111)
Creatinine, Ser: 0.96 mg/dL (ref 0.61–1.24)
GFR, Estimated: >60 mL/min (ref >60)
Glucose, Bld: 121 mg/dL — ABNORMAL HIGH (ref 70–99)
Potassium: 4.3 mmol/L (ref 3.5–5.1)
Sodium: 132 mmol/L — ABNORMAL LOW (ref 135–145)

## 2024-07-26 MED ORDER — ALUM & MAG HYDROXIDE-SIMETH 200-200-20 MG/5ML PO SUSP
30.0000 mL | Freq: Once | ORAL | Status: AC
  Administered 2024-07-26: 30 mL via ORAL
  Filled 2024-07-26: qty 30

## 2024-07-26 MED ORDER — LIDOCAINE VISCOUS HCL 2 % MT SOLN
15.0000 mL | Freq: Once | OROMUCOSAL | Status: AC
  Administered 2024-07-26: 15 mL via ORAL
  Filled 2024-07-26: qty 15

## 2024-07-26 NOTE — ED Notes (Addendum)
 Patient reporting sudden onset of chest pain, dizziness and nausea. Placed on cardiac monitor, new EKG obtained, MD notified.

## 2024-07-26 NOTE — ED Provider Notes (Signed)
 [place] EMERGENCY DEPARTMENT AT [facility] Provider Note  CSN: [id number] Arrival date & time: 07/26/24 0022  Chief Complaint(s) Chest Pain  HPI Ian Ruiz is a 53 y.o. male with a past medical history listed below who presents to the emergency department with 2 days of constant chest pain described as substernal pressure.  Radiates to the back.  No associated shortness of breath.  Reports lightheadedness.  No nausea or vomiting.  Pain worse with eating at times.  Also worse with movement.  Not overtly exertional.  No associated abdominal pain.  No fevers or chills.  No coughing or congestion.  Patient states that he had a heart catheterization earlier this year at [facility] but there is no records of this in Care Everywhere.  Patient came in yesterday for the chest pain but refused labs or nitroglycerin .  Patient was reportedly demanding narcotic medicine before he agreed to receive the workup. Left AMA.  The history is provided by the patient.    Past Medical History Past Medical History:  Diagnosis Date   Meniere disease    Substance abuse (HCC)    Ulcerative colitis    Patient Active Problem List   Diagnosis Date Noted   Polysubstance dependence including opioid type drug without complication, episodic abuse (HCC) 02/21/2016   Substance induced mood disorder (HCC) 02/20/2016   Cocaine abuse (HCC) 02/11/2016   Cocaine-induced mood disorder (HCC) 02/11/2016   Acute pancreatitis 08/12/2012   Lower urinary tract infectious disease 09/20/2011   ARF (acute renal failure) 09/19/2011   Abdominal pain, acute, bilateral lower quadrant 09/19/2011   Leukocytosis 09/19/2011   Suicidal ideation 09/19/2011   Bipolar disorder (manic depression) (HCC) 09/19/2011   Ascites 09/19/2011   Ulcerative colitis (HCC) 09/19/2011   Meniere disease 09/19/2011   Polysubstance abuse (HCC) 09/19/2011   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 Authorizing Provider  citalopram  (CELEXA ) 10 MG tablet Take 1 tablet (10 mg total) by mouth daily. 02/21/16   Jacquetta Sharlot GRADE, NP  gabapentin  (NEURONTIN ) 100 MG capsule Take 2 capsules (200 mg total) by mouth 2 (two) times daily. 02/21/16   Jacquetta Sharlot GRADE, NP  hydrOXYzine  (ATARAX /VISTARIL ) 50 MG tablet Take 1 tablet (50 mg total) by mouth every 6 (six) hours as needed for anxiety. 02/21/16   Jacquetta Sharlot GRADE, NP                                                                                                                                    Allergies Clonidine derivatives and Penicillins  Review of Systems Review of Systems As noted in HPI  Physical Exam Vital Signs  I have reviewed the triage vital signs BP 93/66   Pulse (!) 59   Temp 98.9 F (37.2 C) (Oral)   Resp 12   Ht 5' 10 (1.778 m)  Wt 84 kg   SpO2 (!) 88%   BMI 26.57 kg/m   Physical Exam Vitals reviewed.  Constitutional:      General: He is not in acute distress.    Appearance: He is well-developed. He is not diaphoretic.  HENT:     Head: Normocephalic and atraumatic.     Right Ear: External ear normal.     Left Ear: External ear normal.     Nose: Nose normal.     Mouth/Throat:     Mouth: Mucous membranes are moist.  Eyes:     General: No scleral icterus.    Conjunctiva/sclera: Conjunctivae normal.  Neck:     Trachea: Phonation normal.  Cardiovascular:     Rate and Rhythm: Normal rate and regular rhythm.  Pulmonary:     Effort: Pulmonary effort is normal. No respiratory distress.     Breath sounds: No stridor.  Abdominal:     General: There is no distension.  Musculoskeletal:        General: Normal range of motion.     Cervical back: Normal range of motion.  Neurological:     Mental Status: He is alert and oriented to person, place, and time.  Psychiatric:        Behavior: Behavior normal.     ED Results and Treatments Labs (all  labs ordered are listed, but only abnormal results are displayed) Labs Reviewed  BASIC METABOLIC PANEL WITH GFR - Abnormal; Notable for the following components:      Result Value   Sodium 132 (*)    Chloride 97 (*)    Glucose, Bld 121 (*)    Calcium 8.7 (*)    All other components within normal limits  CBC  TROPONIN I (HIGH SENSITIVITY)  TROPONIN I (HIGH SENSITIVITY)                                                                                                                         EKG  EKG Interpretation Date/Time:  Friday July 26 2024 04:08:42 EST Ventricular Rate:  64 PR Interval:  157 QRS Duration:  102 QT Interval:  409 QTC Calculation: 422 R Axis:   -5  Text Interpretation: Sinus rhythm No significant change was found Confirmed by Trine Likes [phone]) on 07/26/2024 4:22:18 AM       Radiology DG Chest 2 View Result Date: 07/26/2024 EXAM: 2 VIEW(S) XRAY OF THE CHEST 07/26/2024 01:30:00 AM COMPARISON: 07/22/2024 CLINICAL HISTORY: Increasing chest pain over the past 3 days. FINDINGS: LUNGS AND PLEURA: Calcified granuloma is again noted in the right base. No pleural effusion. No pneumothorax. HEART AND MEDIASTINUM: No acute abnormality of the cardiac and mediastinal silhouettes. BONES AND SOFT TISSUES: No acute osseous abnormality. IMPRESSION: 1. No acute cardiopulmonary process identified. 2. Stable calcified granuloma in the right lung base compared to 07/22/2024. Electronically signed by: Oneil Devonshire MD 07/26/2024 01:35 AM EST RP Workstation: [id number]    Medications Ordered in ED Medications  alum & mag  hydroxide-simeth (MAALOX/MYLANTA) 200-200-20 MG/5ML suspension 30 mL (30 mLs Oral Given 07/26/24 0352)    And  lidocaine (XYLOCAINE) 2 % viscous mouth solution 15 mL (15 mLs Oral Given 07/26/24 0352)   Procedures Procedures  (including critical care time) Medical Decision Making / ED Course   Medical Decision Making Amount and/or Complexity of Data  Reviewed Labs: ordered. Decision-making details documented in ED Course. Radiology: ordered and independent interpretation performed. Decision-making details documented in ED Course. ECG/medicine tests: ordered and independent interpretation performed. Decision-making details documented in ED Course.  Risk OTC drugs. Prescription drug management.    Chest pain.  Atypical for ACS.  EKG without acute ischemic changes or evidence of pericarditis.  Initial troponin negative.  Delta troponin negative. Given pain duration, feel this is sufficient to rule out ACS.  CBC without leukocytosis or anemia.  Metabolic panel without significant electrolyte derangements or renal sufficiency.  Chest x-ray without evidence of pneumonia, pneumothorax, pulmonary edema or pleural effusions.  No mediastinal abnormalities.  Not classic for aortic dissection or esophageal perforation.  Low suspicion for pulmonary embolism.  Provided with GI cocktail.  Clinical Course as of 07/26/24 0558  Fri Jul 26, 2024  9556 Patient found sleeping comfortably. When awakened, he states that pain is not better. States food and drink might make it feel better.  [PC]  0557 Chest pain resolved  [PC]    Clinical Course User Index [PC] Lucillie Kiesel, Raynell Moder, MD    Final Clinical Impression(s) / ED Diagnoses Final diagnoses:  Atypical chest pain   The patient appears reasonably screened and/or stabilized for discharge and I doubt any other medical condition or other [facility] requiring further screening, evaluation, or treatment in the ED at this time. I have discussed the findings, Dx and Tx plan with the patient/family who expressed understanding and agree(s) with the plan. Discharge instructions discussed at length. The patient/family was given strict return precautions who verbalized understanding of the instructions. No further questions at time of discharge.  Disposition: Discharge  Condition: Good  ED Discharge Orders      None       Follow Up: Primary care provider  Call  to schedule an appointment for close follow up     This chart was dictated using voice recognition software.  Despite best efforts to proofread,  errors can occur which can change the documentation meaning.    Trine Raynell Moder, MD 07/26/24 [phone]

## 2024-07-26 NOTE — ED Notes (Signed)
 Pt asleep while this RN was triaging him. When attempted to start IV pt started pulling his arm back and tensing his arm. Labs collected, but pt refuses to have IV started. Pt wheeled out to Lobby after Dr. Trine reviewed pt's hx and EKG. Awaiting for labs results and room available.

## 2024-07-26 NOTE — ED Triage Notes (Addendum)
 Pt arrive by [facility] for c/o continues CP x 3 days getting worse in the last 3 hours. Mid chest radiating to his back with some c/o dizziness. Normal EKG for EMS BP 150/80, 90 bpm, 20RR, SPO2  95% RA, CBG 124. 324 mg Aspirin  given by EMS pta.
